# Patient Record
Sex: Female | Born: 1945 | Race: Black or African American | Hispanic: No | Marital: Married | Smoking: Never smoker
Health system: Southern US, Community
[De-identification: ages and names within clinical notes are randomized; demographics above are authoritative.]

## PROBLEM LIST (undated history)

## (undated) DIAGNOSIS — E119 Type 2 diabetes mellitus without complications: Secondary | ICD-10-CM

## (undated) DIAGNOSIS — I519 Heart disease, unspecified: Secondary | ICD-10-CM

## (undated) DIAGNOSIS — H409 Unspecified glaucoma: Secondary | ICD-10-CM

## (undated) DIAGNOSIS — K219 Gastro-esophageal reflux disease without esophagitis: Secondary | ICD-10-CM

## (undated) DIAGNOSIS — N189 Chronic kidney disease, unspecified: Secondary | ICD-10-CM

## (undated) HISTORY — DX: Heart disease, unspecified: I51.9

## (undated) HISTORY — DX: Unspecified glaucoma: H40.9

## (undated) HISTORY — PX: LUMBAR DISC SURGERY: SHX700

## (undated) HISTORY — DX: Chronic kidney disease, unspecified: N18.9

## (undated) HISTORY — PX: TRABECULECTOMY: SHX107

## (undated) HISTORY — PX: WRIST SURGERY: SHX841

## (undated) HISTORY — PX: VITRECTOMY AND CATARACT: SHX6184

## (undated) HISTORY — PX: CERVICAL FUSION: SHX112

## (undated) HISTORY — PX: ABDOMINAL HYSTERECTOMY: SHX81

## (undated) HISTORY — PX: REDUCTION MAMMAPLASTY: SUR839

## (undated) HISTORY — PX: OTHER SURGICAL HISTORY: SHX169

## (undated) HISTORY — DX: Gastro-esophageal reflux disease without esophagitis: K21.9

## (undated) HISTORY — PX: TUBAL LIGATION: SHX77

## (undated) HISTORY — PX: BREAST SURGERY: SHX581

## (undated) HISTORY — PX: KNEE CARTILAGE SURGERY: SHX688

## (undated) HISTORY — DX: Type 2 diabetes mellitus without complications: E11.9

## (undated) HISTORY — PX: EYE SURGERY: SHX253

## (undated) HISTORY — PX: SHOULDER SURGERY: SHX246

## (undated) HISTORY — PX: RETINOPATHY SURGERY: SHX765

---

## 2018-03-30 ENCOUNTER — Encounter: Payer: Self-pay | Admitting: Family Medicine

## 2018-03-30 ENCOUNTER — Ambulatory Visit (INDEPENDENT_AMBULATORY_CARE_PROVIDER_SITE_OTHER): Payer: Medicare Other | Admitting: Family Medicine

## 2018-03-30 VITALS — BP 122/66 | HR 51 | Temp 97.6°F | Ht 65.0 in | Wt 169.8 lb

## 2018-03-30 DIAGNOSIS — J309 Allergic rhinitis, unspecified: Secondary | ICD-10-CM | POA: Insufficient documentation

## 2018-03-30 DIAGNOSIS — G8929 Other chronic pain: Secondary | ICD-10-CM | POA: Insufficient documentation

## 2018-03-30 DIAGNOSIS — Z1211 Encounter for screening for malignant neoplasm of colon: Secondary | ICD-10-CM | POA: Diagnosis not present

## 2018-03-30 DIAGNOSIS — M25512 Pain in left shoulder: Secondary | ICD-10-CM

## 2018-03-30 DIAGNOSIS — E1122 Type 2 diabetes mellitus with diabetic chronic kidney disease: Secondary | ICD-10-CM | POA: Diagnosis not present

## 2018-03-30 DIAGNOSIS — I152 Hypertension secondary to endocrine disorders: Secondary | ICD-10-CM | POA: Insufficient documentation

## 2018-03-30 DIAGNOSIS — N184 Chronic kidney disease, stage 4 (severe): Secondary | ICD-10-CM | POA: Insufficient documentation

## 2018-03-30 DIAGNOSIS — H409 Unspecified glaucoma: Secondary | ICD-10-CM | POA: Diagnosis not present

## 2018-03-30 DIAGNOSIS — K219 Gastro-esophageal reflux disease without esophagitis: Secondary | ICD-10-CM

## 2018-03-30 DIAGNOSIS — I10 Essential (primary) hypertension: Secondary | ICD-10-CM

## 2018-03-30 DIAGNOSIS — E1159 Type 2 diabetes mellitus with other circulatory complications: Secondary | ICD-10-CM

## 2018-03-30 DIAGNOSIS — N183 Chronic kidney disease, stage 3 unspecified: Secondary | ICD-10-CM | POA: Insufficient documentation

## 2018-03-30 DIAGNOSIS — D649 Anemia, unspecified: Secondary | ICD-10-CM | POA: Insufficient documentation

## 2018-03-30 DIAGNOSIS — M25511 Pain in right shoulder: Secondary | ICD-10-CM

## 2018-03-30 MED ORDER — INTEGRA F 125-1 MG PO CAPS: 1.0000 | ORAL_CAPSULE | ORAL | 3 refills | Status: DC

## 2018-03-30 NOTE — Assessment & Plan Note (Signed)
At goal  Continue lisinopril 20mg daily

## 2018-03-30 NOTE — Assessment & Plan Note (Signed)
We will refill iron supplement.  Obtain records from previous PCP.  Recheck CBC at next visit with blood draw.

## 2018-03-30 NOTE — Assessment & Plan Note (Signed)
Secondary to bilateral rotator cuff tears.  Referral placed to orthopedics.

## 2018-03-30 NOTE — Progress Notes (Signed)
Subjective:  Brandi Cooper is a 73 y.o. female who presents today with a chief complaint of T2DM and to establish care.  HPI:  Her stable, chronic medical conditions are outlined below:  # T2DM - Currently on actos 45mg  daily and glipizide 20mg  bid -Last A1c was 5.7  # HTN - Currently on lisinopril 20mg  daily.  Tolerating well.  # CKD 3 - Follows with nephrology - needs a referral for [place] new [place] in [place].  - About 10 years ago "went into kidney failure" and was hospitalized.  Was told that she needed dialysis however kidneys eventually recovered. -Takes torsemide 20 mg daily as needed for leg swelling.  # Glaucoma - On acetazolamide 250mg  daily, timolol drops, xalatan drops, and dorzolamide drops  # Allergy - Take Singulair 10 mg daily at bedtime.  # GERD - On omeprazole 40mg  daily  # Anemia / Leukopenia - On iron supplementation - Integra F  # Chronic Low Back Pain secondary to degenerative disc disease -Managing without medications.  # History of bilateral rotator cuff tear - Needs referral to orthopedics in [place].  ROS: Per HPI, otherwise a complete review of systems was negative.   PMH:  The following were reviewed and entered/updated in epic: Past Medical History:  Diagnosis Date  . Chronic kidney disease   . Diabetes mellitus without complication (HCC)   . GERD (gastroesophageal reflux disease)   . Glaucoma   . Heart disease    Patient Active Problem List   Diagnosis Date Noted  . Type 2 diabetes mellitus with stage 3 chronic kidney disease, without long-term current use of insulin (HCC) 03/30/2018  . CKD (chronic kidney disease) stage 3, GFR 30-59 ml/min (HCC) 03/30/2018  . Hypertension associated with diabetes (HCC) 03/30/2018  . Glaucoma 03/30/2018  . Chronic pain of both shoulders 03/30/2018  . Allergic rhinitis 03/30/2018  . Anemia 03/30/2018   Past Surgical History:  Procedure Laterality Date  . ABDOMINAL HYSTERECTOMY     . blood transfusion    . BREAST SURGERY     Breast Reduction  . CERVICAL FUSION    . EYE SURGERY    . KNEE CARTILAGE SURGERY    . LUMBAR DISC SURGERY    . RETINOPATHY SURGERY    . SHOULDER SURGERY Bilateral    Rotator Cuff  . TRABECULECTOMY    . TRABECULECTOMY    . TUBAL LIGATION    . VITRECTOMY AND CATARACT    . WRIST SURGERY     Carpal Tunnel    Family History  Problem Relation Age of Onset  . Diabetes Mother   . Hypertension Mother   . Stroke Mother   . Diabetes Father   . Arthritis Sister   . Cancer Sister   . Heart attack Sister   . Cancer Brother   . Hypertension Daughter   . Cancer Son   . Stroke Maternal Grandmother   . Hypertension Maternal Grandmother   . Diabetes Paternal Grandmother   . Heart attack Paternal Grandfather   . Cancer Sister   . Cancer Sister   . Stroke Sister   . Hypertension Sister   . Diabetes Brother   . Kidney disease Brother     Medications- reviewed and updated Current Outpatient Medications  Medication Sig Dispense Refill  . Ascorbic Acid (VITAMIN C) 1000 MG tablet Take 1,000 mg by mouth daily.    Marland Kitchen. aspirin 81 MG tablet Take 81 mg by mouth daily.    . Biotin 5000 MCG CAPS  Take by mouth.    . Cholecalciferol (VITAMIN D3) 50 MCG (2000 UT) capsule Take by mouth.    Marland Kitchen glipiZIDE (GLUCOTROL) 10 MG tablet Take 20 mg by mouth 2 (two) times daily before a meal.    . GNP CINNAMON PO Take by mouth. Takes 2000mg  2 capfuls daily    . lisinopril (PRINIVIL,ZESTRIL) 20 MG tablet Take 20 mg by mouth daily.    . montelukast (SINGULAIR) 10 MG tablet Take 10 mg by mouth at bedtime.    . Omega-3 Fatty Acids (FISH OIL) 1200 MG CAPS Take 2 capsules by mouth.    Marland Kitchen omeprazole (PRILOSEC) 40 MG capsule Take 40 mg by mouth daily.    . pioglitazone (ACTOS) 45 MG tablet Take 45 mg by mouth daily.    . timolol (BETIMOL) 0.5 % ophthalmic solution Place 1 drop into both eyes 2 (two) times daily.    Marland Kitchen torsemide (DEMADEX) 20 MG tablet Take 20 mg by mouth  daily.    Marland Kitchen acetaZOLAMIDE (DIAMOX) 250 MG tablet     . dorzolamide (TRUSOPT) 2 % ophthalmic solution Place into both eyes 3 (three) times daily.     . Fe Fum-FePoly-FA-Vit C-Vit B3 (INTEGRA F) 125-1 MG CAPS Take 1 capsule by mouth every other day. 45 capsule 3  . latanoprost (XALATAN) 0.005 % ophthalmic solution      No current facility-administered medications for this visit.     Allergies-reviewed and updated Allergies  Allergen Reactions  . Amitriptyline     Social History   Socioeconomic History  . Marital status: Married    Spouse name: Not on file  . Number of children: Not on file  . Years of education: Not on file  . Highest education level: Not on file  Occupational History  . Not on file  Social Needs  . Financial resource strain: Not on file  . Food insecurity:    Worry: Not on file    Inability: Not on file  . Transportation needs:    Medical: Not on file    Non-medical: Not on file  Tobacco Use  . Smoking status: Never Smoker  . Smokeless tobacco: Never Used  Substance and Sexual Activity  . Alcohol use: Never    Frequency: Never  . Drug use: Never  . Sexual activity: Not on file  Lifestyle  . Physical activity:    Days per week: Not on file    Minutes per session: Not on file  . Stress: Not on file  Relationships  . Social connections:    Talks on phone: Not on file    Gets together: Not on file    Attends religious service: Not on file    Active member of club or organization: Not on file    Attends meetings of clubs or organizations: Not on file    Relationship status: Not on file  Other Topics Concern  . Not on file  Social History Narrative  . Not on file     Objective:  Physical Exam: BP 122/66 (BP Location: Left Arm, Patient Position: Sitting, Cuff Size: Normal)   Pulse (!) 51   Temp 97.6 F (36.4 C) (Oral)   Ht 5\' 5"  (1.651 m)   Wt 169 lb 12 oz (77 kg)   SpO2 99%   BMI 28.25 kg/m   Gen: NAD, resting comfortably CV: RRR with  no murmurs appreciated Pulm: NWOB, CTAB with no crackles, wheezes, or rhonchi GI: Normal bowel sounds present. Soft, Nontender, Nondistended. MSK:  No edema, cyanosis, or clubbing noted Skin: Warm, dry Neuro: Grossly normal, moves all extremities Psych: Normal affect and thought content  Assessment/Plan:  Type 2 diabetes mellitus with stage 3 chronic kidney disease, without long-term current use of insulin (HCC) Continue Actos 45 mg daily and glipizide 20 mg twice daily.  Obtain records from previous PCP.  Follow-up with me in a few months for repeat A1c testing.  Hypertension associated with diabetes (HCC) At goal.  Continue lisinopril 20 mg daily.  Glaucoma Continue state is alive and drops.  Will place referral to retina specialist.  CKD (chronic kidney disease) stage 3, GFR 30-59 ml/min (HCC) Obtain records from previous PCP.  Will place referral to nephrology.  Chronic pain of both shoulders Secondary to bilateral rotator cuff tears.  Referral placed to orthopedics.  Anemia We will refill iron supplement.  Obtain records from previous PCP.  Recheck CBC at next visit with blood draw.  Allergic rhinitis Continue Singulair 10 mg daily.  Preventative Healthcare Patient was instructed to return soon for CPE.  Obtain records from previous PCP. Health Maintenance Due  Topic Date Due  . HEMOGLOBIN A1C  [birth year]  . Hepatitis C Screening  1945-10-19  . FOOT EXAM  08/30/1956  . OPHTHALMOLOGY EXAM  08/30/1956  . TETANUS/TDAP  08/30/1965  . MAMMOGRAM  08/30/1996  . COLONOSCOPY  08/30/1996  . DEXA SCAN  08/31/2011  . PNA vac Low Risk Adult (1 of 2 - PCV13) 08/31/2011   Time Spent: I spent >60 minutes face-to-face with the patient, with more than half spent on coordinating care and counseling for management plan for type 2 diabetes, hypertension, glaucoma, CKD stage III, chronic shoulder pain, anemia, allergic rhinitis.  Katina Degree. [name], MD 03/30/2018 5:17 PM

## 2018-03-30 NOTE — Assessment & Plan Note (Signed)
Obtain records from previous PCP.  Will place referral to nephrology.

## 2018-03-30 NOTE — Patient Instructions (Addendum)
It was very nice to see you today!  I will refer you to an ophthalmologist, nephrologist, orthopedist, and gastroenterologist.  I will send in more Integra F.  Please come back to see me in a few months for your physical with blood work.  Come back to see me sooner as needed.  Take care, Dr [name]

## 2018-03-30 NOTE — Assessment & Plan Note (Signed)
Continue Actos 45 mg daily and glipizide 20 mg twice daily.  Obtain records from previous PCP.  Follow-up with me in a few months for repeat A1c testing.

## 2018-03-30 NOTE — Assessment & Plan Note (Signed)
Continue state is alive and drops.  Will place referral to retina specialist.

## 2018-03-30 NOTE — Assessment & Plan Note (Signed)
Continue Singulair 10 mg daily.

## 2018-05-10 ENCOUNTER — Encounter: Payer: Self-pay | Admitting: Gastroenterology

## 2018-05-31 ENCOUNTER — Telehealth: Payer: Self-pay

## 2018-05-31 NOTE — Telephone Encounter (Signed)
Covid-19 travel screening questions  Have you traveled in the last 14 days? No If yes where?  Do you now or have you had a fever in the last 14 days? No  Do you have any respiratory symptoms of shortness of breath or cough now or in the last 14 days? No  Do you have a medical history of Congestive Heart Failure? N/A  Do you have a medical history of lung disease? N/A  Do you have any family members or close contacts with diagnosed or suspected Covid-19? No  Pt plans to keep her appt as scheduled.        

## 2018-06-01 ENCOUNTER — Encounter: Payer: Self-pay | Admitting: Gastroenterology

## 2018-06-01 ENCOUNTER — Ambulatory Visit (INDEPENDENT_AMBULATORY_CARE_PROVIDER_SITE_OTHER): Payer: Medicare Other | Admitting: Gastroenterology

## 2018-06-01 ENCOUNTER — Other Ambulatory Visit: Payer: Self-pay

## 2018-06-01 ENCOUNTER — Encounter (INDEPENDENT_AMBULATORY_CARE_PROVIDER_SITE_OTHER): Payer: Self-pay

## 2018-06-01 VITALS — BP 102/58 | HR 60 | Temp 98.2°F | Ht 65.0 in | Wt 176.0 lb

## 2018-06-01 DIAGNOSIS — R1013 Epigastric pain: Secondary | ICD-10-CM

## 2018-06-01 DIAGNOSIS — Z800 Family history of malignant neoplasm of digestive organs: Secondary | ICD-10-CM | POA: Diagnosis not present

## 2018-06-01 DIAGNOSIS — D649 Anemia, unspecified: Secondary | ICD-10-CM

## 2018-06-01 NOTE — Patient Instructions (Signed)
If you are age 72 or older, your body mass index should be between 23-30. Your Body mass index is 29.29 kg/m. If this is out of the aforementioned range listed, please consider follow up with your Primary Care Provider.  If you are age 42 or younger, your body mass index should be between 19-25. Your Body mass index is 29.29 kg/m. If this is out of the aformentioned range listed, please consider follow up with your Primary Care Provider.   You will be due for a recall colonoscopy in 07-2018. We will send you a reminder in the mail when it gets closer to that time.  To help prevent the possible spread of infection to our patients, communities, and staff; we will be implementing the following measures:  Please only allow one visitor/family member to accompany you to any upcoming appointments with [facility]. If you have any concerns about this please contact our office to discuss prior to the appointment.   Take omeprazole 1 every other day for a week. Then stop.  It was a pleasure to see you today!  Dr. Myrtie Neither

## 2018-06-01 NOTE — Progress Notes (Addendum)
[facility] Consult Note:  History: Brandi Cooper 06/01/2018  Referring physician: Ardith Dark, MD  Reason for consult/chief complaint: Colon Cancer Screening (son passed with colon cancer in 07/15/2012. used to have colonoscopy/endoscopy ever 3 years.  )   Subjective  HPI: Recently became a new patient of Brandi Cooper with 1 PCP office note available for review.Reportedly good diabetic control. Reports colonoscopy in [facility] 07-15-801-May-2011, Jul 15, 2012  Brandi Cooper is a pleasant 73 year old woman, and a tangential historian.  There are no records available from her GI practice in [place], and she says she was unable to get any from them, and had difficulty "getting answers" on her problems and reasons for medicines.  She was told at one point she had "ulcers".  She figures this is the reason she is on omeprazole but is uncertain.  She denies heartburn, dysphagia, nausea, vomiting or early satiety.  She was having dyspepsia some years in the past, and thinks maybe that was why the omeprazole was started.  She denies chronic abdominal pain, bloating diarrhea or rectal bleeding.  She was on iron supplements, but says they always cause bloating and diarrhea so she stopped.  Her son died in 15-Jul-2012, the circumstances are unknown.  She says that she took him to the hospital many times and could never figure out what was wrong.  It culminated in hospitalization when he was severely ill died in the hospital shortly thereafter.  It sounds like she may have incomplete recollection of those events, but tells me that 1 of her daughters told her later that colon cancer was the cause of death.  Brandi Cooper has not yet had scheduled follow-up with primary care nor had any labs done.  ROS:  Review of Systems  Constitutional: Negative for appetite change and unexpected weight change.  HENT: Negative for mouth sores and voice change.   Eyes: Negative for pain and redness.  Respiratory: Negative for cough and  shortness of breath.   Cardiovascular: Negative for chest pain and palpitations.  Genitourinary: Negative for dysuria and hematuria.  Musculoskeletal: Positive for arthralgias. Negative for myalgias.  Skin: Negative for pallor and rash.  Neurological: Negative for weakness and headaches.  Hematological: Negative for adenopathy.     Past Medical History: Past Medical History:  Diagnosis Date  . Chronic kidney disease   . Diabetes mellitus without complication (HCC)   . GERD (gastroesophageal reflux disease)   . Glaucoma   . Heart disease      Past Surgical History: Past Surgical History:  Procedure Laterality Date  . ABDOMINAL HYSTERECTOMY    . blood transfusion    . BREAST SURGERY     Breast Reduction  . CERVICAL FUSION    . EYE SURGERY    . KNEE CARTILAGE SURGERY    . LUMBAR DISC SURGERY    . RETINOPATHY SURGERY    . SHOULDER SURGERY Bilateral    Rotator Cuff  . TRABECULECTOMY    . TUBAL LIGATION    . VITRECTOMY AND CATARACT    . WRIST SURGERY     Carpal Tunnel     Family History: Family History  Problem Relation Age of Onset  . Diabetes Mother   . Hypertension Mother   . Stroke Mother   . Diabetes Father   . Arthritis Sister   . Cancer Sister   . Heart attack Sister   . Cancer Brother   . Hypertension Daughter   . Cancer Son   . Stroke Maternal Grandmother   .  Hypertension Maternal Grandmother   . Diabetes Paternal Grandmother   . Heart attack Paternal Grandfather   . Cancer Sister   . Cancer Sister   . Stroke Sister   . Hypertension Sister   . Diabetes Brother   . Kidney disease Brother     Social History: Social History   Socioeconomic History  . Marital status: Married    Spouse name: Not on file  . Number of children: Not on file  . Years of education: Not on file  . Highest education level: Not on file  Occupational History  . Not on file  Social Needs  . Financial resource strain: Not on file  . Food insecurity:    Worry: Not  on file    Inability: Not on file  . Transportation needs:    Medical: Not on file    Non-medical: Not on file  Tobacco Use  . Smoking status: Never Smoker  . Smokeless tobacco: Never Used  Substance and Sexual Activity  . Alcohol use: Never    Frequency: Never  . Drug use: Never  . Sexual activity: Not on file  Lifestyle  . Physical activity:    Days per week: Not on file    Minutes per session: Not on file  . Stress: Not on file  Relationships  . Social connections:    Talks on phone: Not on file    Gets together: Not on file    Attends religious service: Not on file    Active member of club or organization: Not on file    Attends meetings of clubs or organizations: Not on file    Relationship status: Not on file  Other Topics Concern  . Not on file  Social History Narrative  . Not on file    Allergies: Allergies  Allergen Reactions  . Amitriptyline   . Nsaids     Was told to avoid all NSAIDs due to kidney function    Outpatient Meds: Current Outpatient Medications  Medication Sig Dispense Refill  . acetaZOLAMIDE (DIAMOX) 250 MG tablet     . Ascorbic Acid (VITAMIN C) 1000 MG tablet Take 1,000 mg by mouth daily.    Marland Kitchen aspirin 81 MG tablet Take 81 mg by mouth daily.    . Biotin 5000 MCG CAPS Take by mouth.    . Cholecalciferol (VITAMIN D3) 50 MCG (2000 UT) capsule Take by mouth.    . dorzolamide (TRUSOPT) 2 % ophthalmic solution Place into both eyes 3 (three) times daily.     Marland Kitchen glipiZIDE (GLUCOTROL) 10 MG tablet Take 20 mg by mouth 2 (two) times daily before a meal.    . GNP CINNAMON PO Take by mouth. Takes  2 capfuls daily    . latanoprost (XALATAN) 0.005 % ophthalmic solution     . lisinopril (PRINIVIL,ZESTRIL) 20 MG tablet Take 20 mg by mouth daily.    . Omega-3 Fatty Acids (FISH OIL) 1200 MG CAPS Take 2 capsules by mouth.    Marland Kitchen omeprazole (PRILOSEC) 40 MG capsule Take 40 mg by mouth daily.    . pioglitazone (ACTOS) 45 MG tablet Take 45 mg by mouth daily.     . timolol (BETIMOL) 0.5 % ophthalmic solution Place 1 drop into both eyes 2 (two) times daily.    Marland Kitchen torsemide (DEMADEX) 20 MG tablet Take 20 mg by mouth daily.     No current facility-administered medications for this visit.       ___________________________________________________________________ Objective   Exam:  BP (!) 102/58   Pulse 60   Temp 98.2 F (36.8 C)   Ht 5\' 5"  (1.651 m)   Wt 176 lb (79.8 kg)   BMI 29.29 kg/m    General: Well-appearing, pleasant and conversational  Eyes: sclera anicteric, no redness  ENT: oral mucosa moist without lesions, no cervical or supraclavicular lymphadenopathy  CV: RRR without murmur, S1/S2, no JVD, no peripheral edema  Resp: clear to auscultation bilaterally, normal RR and effort noted  GI: soft, no tenderness, with active bowel sounds. No guarding or palpable organomegaly noted.  Skin; warm and dry, no rash or jaundice noted  Neuro: awake, alert and oriented x 3. Normal gross motor function and fluent speech  Labs:  No prior records available for review.  No labs or other testing were done after the January 16 initial PCP visit.  Assessment: Encounter Diagnoses  Name Primary?  . Dyspepsia Yes  . Family history of colon cancer   . Anemia, unspecified type     Previous history of dyspepsia, feeling well at present.  Unclear initial indication for omeprazole.  Although I have no prior records to know the exact indication, she seems well at present, and I think it would be reasonable to try and wean off it and reassess symptoms.  She would like to be off any unnecessary meds if possible.  Reported history of anemia, no details.  I wonder if it is related to the reported chronic kidney disease.  Reported history of colon polyps, though no details available.  Reported history of colon cancer in her son.  Patient's last colonoscopy was 2014 according to her best recollection.  She has a list of all her physicians and dates  of procedures.  Plan:  Decrease omeprazole to every other day for the next week and then stop.  If she has frequent heartburn or recurrence of dyspepsia, the medication can certainly be resumed and I asked her to contact me if that occurs.  If we take at face value the report from a family member that Brandi Cooper's had colon cancer, then a colonoscopy after a 5-year interval would be reasonable.  We put her on a recall about 2 months from now because we are currently not scheduling routine endoscopic procedures due to COVID-19.  We will also make a request for records from her previous GI physician.  Thank you for the courtesy of this consult.  Please call me with any questions or concerns.  Charlie Pitter III  CC: Referring provider noted above   _____________________________________________  Addendum for record review 06/15/18:  From [facility] gastroenterology in [place], office note dated October 2017 following up EGD colonoscopy.  Patient complaining loose stool and burning in abdomen.  Noted to have been FOBT +June 2017 hemoglobin 9.9 threatening 1.47 EGD and colonoscopy September 2017 reportedly irregular Z line, hiatal hernia, multiple antral ulcers, diverticulosis, "medium hemorrhoids".  For unclear reasons, they then recommended colonoscopy at a 5-year interval. Gastric biopsy done during EGD on 12/05/2015 was negative for H. Pylori.  Records from a different GI practice in [place] dated July 2014  Indicates multiple endoscopic procedures going back to 2008.  In 2008 reported gastric ulcer, 2009 gastric ulcer had healed.  2008 colonoscopy rectal tubular adenoma, 2011 colonoscopy with hemorrhoids, otherwise unremarkable.  Pathology from January 2011 colonoscopy was normal terminal ileal biopsies were no microscopic colitis.  She was noted to have over 20 mast cells per high far field with a positive tryptase immunoperoxidase stain, consistent  with mastosytic colitis.  There is no  indication in office notes of what the clinical significance this was believed to be, nor what therapies may have been undertaken.  Biopsy from 2009 upper endoscopy negative for H. Pylori.  Based on that, the patient is not currently due for a colonoscopy, even if we believe her son to truly have had colorectal cancer.  She would not be due until September 2022.  Therefore, I will have our office adjust her recall to that period of time and let her know.   Amada Jupiter, MD    [facility]

## 2018-06-30 ENCOUNTER — Ambulatory Visit: Payer: Medicare Other | Admitting: Family Medicine

## 2018-08-21 ENCOUNTER — Telehealth: Payer: Self-pay | Admitting: Family Medicine

## 2018-08-21 ENCOUNTER — Other Ambulatory Visit: Payer: Self-pay

## 2018-08-21 NOTE — Telephone Encounter (Signed)
Copied from [place] [id number]. Topic: Quick Communication - Rx Refill/Question >> Aug 21, 2018 10:48 AM Blase Mess A wrote: Medication: lisinopril (PRINIVIL,ZESTRIL) 20 MG tablet [[id number]] , glipiZIDE (GLUCOTROL) 10 MG tablet [[id number]],   Has the patient contacted their pharmacy? Yes  (Agent: If no, request that the patient contact the pharmacy for the refill.) (Agent: If yes, when and what did the pharmacy advise?)  Preferred Pharmacy (with phone number or street name): [facility] [place], [address] AT [address] [phone] (Phone) [phone] (Fax)    Agent: Please be advised that RX refills may take up to 3 business days. We ask that you follow-up with your pharmacy.

## 2018-08-21 NOTE — Telephone Encounter (Signed)
See request °

## 2018-08-23 ENCOUNTER — Other Ambulatory Visit: Payer: Self-pay

## 2018-08-23 ENCOUNTER — Telehealth: Payer: Self-pay | Admitting: Family Medicine

## 2018-08-23 MED ORDER — GLIPIZIDE 10 MG PO TABS: 20.0000 mg | ORAL_TABLET | Freq: Two times a day (BID) | ORAL | 0 refills | Status: DC

## 2018-08-23 MED ORDER — PIOGLITAZONE HCL 45 MG PO TABS: 45.0000 mg | ORAL_TABLET | Freq: Every day | ORAL | 1 refills | Status: DC

## 2018-08-23 MED ORDER — LISINOPRIL 20 MG PO TABS: 20.0000 mg | ORAL_TABLET | Freq: Every day | ORAL | 0 refills | Status: DC

## 2018-08-23 MED ORDER — TORSEMIDE 20 MG PO TABS: 20.0000 mg | ORAL_TABLET | Freq: Every day | ORAL | 1 refills | Status: DC

## 2018-08-23 NOTE — Telephone Encounter (Signed)
Rx sent 

## 2018-08-23 NOTE — Telephone Encounter (Signed)
Copied from [place] [id number]. Topic: Quick Communication - Rx Refill/Question >> Aug 23, 2018  3:22 PM Alanda Slim E wrote: Medication: torsemide (DEMADEX) 20 MG tablet pioglitazone (ACTOS) 45 MG tablet  Has the patient contacted their pharmacy?  Call pcp for new Rx  Preferred Pharmacy (with phone number or street name): [facility] [place], [address] AT [address] [place] [phone] (Phone) [phone] (Fax)    Agent: Please be advised that RX refills may take up to 3 business days. We ask that you follow-up with your pharmacy.

## 2018-08-23 NOTE — Telephone Encounter (Signed)
Rx sent to pharmacy   

## 2018-09-26 ENCOUNTER — Encounter: Payer: Self-pay | Admitting: Family Medicine

## 2018-09-26 ENCOUNTER — Other Ambulatory Visit: Payer: Self-pay

## 2018-09-26 ENCOUNTER — Ambulatory Visit (INDEPENDENT_AMBULATORY_CARE_PROVIDER_SITE_OTHER): Payer: Medicare Other | Admitting: Family Medicine

## 2018-09-26 VITALS — BP 114/64 | HR 62 | Temp 97.4°F | Ht 65.0 in | Wt 178.0 lb

## 2018-09-26 DIAGNOSIS — G44009 Cluster headache syndrome, unspecified, not intractable: Secondary | ICD-10-CM

## 2018-09-26 DIAGNOSIS — Z13220 Encounter for screening for lipoid disorders: Secondary | ICD-10-CM | POA: Diagnosis not present

## 2018-09-26 DIAGNOSIS — N183 Chronic kidney disease, stage 3 unspecified: Secondary | ICD-10-CM

## 2018-09-26 DIAGNOSIS — Z1159 Encounter for screening for other viral diseases: Secondary | ICD-10-CM

## 2018-09-26 DIAGNOSIS — J309 Allergic rhinitis, unspecified: Secondary | ICD-10-CM

## 2018-09-26 DIAGNOSIS — E1122 Type 2 diabetes mellitus with diabetic chronic kidney disease: Secondary | ICD-10-CM | POA: Diagnosis not present

## 2018-09-26 DIAGNOSIS — Z0001 Encounter for general adult medical examination with abnormal findings: Secondary | ICD-10-CM | POA: Diagnosis not present

## 2018-09-26 DIAGNOSIS — E1159 Type 2 diabetes mellitus with other circulatory complications: Secondary | ICD-10-CM

## 2018-09-26 DIAGNOSIS — M25512 Pain in left shoulder: Secondary | ICD-10-CM

## 2018-09-26 DIAGNOSIS — G8929 Other chronic pain: Secondary | ICD-10-CM

## 2018-09-26 DIAGNOSIS — R682 Dry mouth, unspecified: Secondary | ICD-10-CM

## 2018-09-26 DIAGNOSIS — I10 Essential (primary) hypertension: Secondary | ICD-10-CM

## 2018-09-26 DIAGNOSIS — R42 Dizziness and giddiness: Secondary | ICD-10-CM | POA: Diagnosis not present

## 2018-09-26 DIAGNOSIS — D649 Anemia, unspecified: Secondary | ICD-10-CM

## 2018-09-26 DIAGNOSIS — I152 Hypertension secondary to endocrine disorders: Secondary | ICD-10-CM

## 2018-09-26 DIAGNOSIS — H409 Unspecified glaucoma: Secondary | ICD-10-CM

## 2018-09-26 DIAGNOSIS — M25511 Pain in right shoulder: Secondary | ICD-10-CM

## 2018-09-26 LAB — COMPREHENSIVE METABOLIC PANEL
ALT: 11 U/L (ref 0–35)
AST: 14 U/L (ref 0–37)
Albumin: 4.1 g/dL (ref 3.5–5.2)
Alkaline Phosphatase: 75 U/L (ref 39–117)
BUN: 45 mg/dL — ABNORMAL HIGH (ref 6–23)
CO2: 19 mEq/L (ref 19–32)
Calcium: 8.8 mg/dL (ref 8.4–10.5)
Chloride: 113 mEq/L — ABNORMAL HIGH (ref 96–112)
Creatinine, Ser: 1.97 mg/dL — ABNORMAL HIGH (ref 0.40–1.20)
GFR: 30.15 mL/min — ABNORMAL LOW (ref >60.00)
Glucose, Bld: 100 mg/dL — ABNORMAL HIGH (ref 70–99)
Potassium: 4.2 mEq/L (ref 3.5–5.1)
Sodium: 141 mEq/L (ref 135–145)
Total Bilirubin: 0.4 mg/dL (ref 0.2–1.2)
Total Protein: 7 g/dL (ref 6.0–8.3)

## 2018-09-26 LAB — LIPID PANEL
Cholesterol: 172 mg/dL (ref 0–200)
HDL: 55 mg/dL (ref >39.00)
LDL Cholesterol: 97 mg/dL (ref 0–99)
NonHDL: 116.6
Total CHOL/HDL Ratio: 3
Triglycerides: 97 mg/dL (ref 0.0–149.0)
VLDL: 19.4 mg/dL (ref 0.0–40.0)

## 2018-09-26 LAB — CBC
HCT: 32.8 % — ABNORMAL LOW (ref 36.0–46.0)
Hemoglobin: 10.6 g/dL — ABNORMAL LOW (ref 12.0–15.0)
MCHC: 32.3 g/dL (ref 30.0–36.0)
MCV: 92.1 fl (ref 78.0–100.0)
Platelets: 186 10*3/uL (ref 150.0–400.0)
RBC: 3.56 Mil/uL — ABNORMAL LOW (ref 3.87–5.11)
RDW: 15.1 % (ref 11.5–15.5)
WBC: 5.7 10*3/uL (ref 4.0–10.5)

## 2018-09-26 LAB — VITAMIN D 25 HYDROXY (VIT D DEFICIENCY, FRACTURES): VITD: 50.99 ng/mL (ref 30.00–100.00)

## 2018-09-26 LAB — HEMOGLOBIN A1C: Hgb A1c MFr Bld: 6.3 % (ref 4.6–6.5)

## 2018-09-26 LAB — VITAMIN B12: Vitamin B-12: 1010 pg/mL — ABNORMAL HIGH (ref 211–911)

## 2018-09-26 LAB — TSH: TSH: 1.4 u[IU]/mL (ref 0.35–4.50)

## 2018-09-26 NOTE — Assessment & Plan Note (Signed)
Likely secondary to medications.  Encouraged good oral hydration.  Also recommended Biotene mouthwash.  Recommend follow-up with dentistry soon.

## 2018-09-26 NOTE — Assessment & Plan Note (Signed)
Check CBC 

## 2018-09-26 NOTE — Assessment & Plan Note (Signed)
No red flags.  Offered prescription for triptan however patient declined.

## 2018-09-26 NOTE — Assessment & Plan Note (Signed)
At goal  Continue lisinopril 20mg daily

## 2018-09-26 NOTE — Patient Instructions (Signed)
It was very nice to see you today!  Please let me know if the vertigo or headaches come back.  Please schedule your mammogram and bone density scan soon.  We will check blood work today.  Come back in 6 months, or sooner if needed.   Take care, Dr Jerline Pain  Please try these tips to maintain a healthy lifestyle:   Eat at least 3 REAL meals and 1-2 snacks per day.  Aim for no more than 5 hours between eating.  If you eat breakfast, please do so within one hour of getting up.    Obtain twice as many fruits/vegetables as protein or carbohydrate foods for both lunch and dinner. (Half of each meal should be fruits/vegetables, one quarter protein, and one quarter starchy carbs)   Cut down on sweet beverages. This includes juice, soda, and sweet tea.    Exercise at least 150 minutes every week.    Preventive Care 45 Years and Older, Female Preventive care refers to lifestyle choices and visits with your health care provider that can promote health and wellness. This includes:  A yearly physical exam. This is also called an annual well check.  Regular dental and eye exams.  Immunizations.  Screening for certain conditions.  Healthy lifestyle choices, such as diet and exercise. What can I expect for my preventive care visit? Physical exam Your health care provider will check:  Height and weight. These may be used to calculate body mass index (BMI), which is a measurement that tells if you are at a healthy weight.  Heart rate and blood pressure.  Your skin for abnormal spots. Counseling Your health care provider may ask you questions about:  Alcohol, tobacco, and drug use.  Emotional well-being.  Home and relationship well-being.  Sexual activity.  Eating habits.  History of falls.  Memory and ability to understand (cognition).  Work and work Statistician.  Pregnancy and menstrual history. What immunizations do I need?  Influenza (flu) vaccine  This is  recommended every year. Tetanus, diphtheria, and pertussis (Tdap) vaccine  You may need a Td booster every 10 years. Varicella (chickenpox) vaccine  You may need this vaccine if you have not already been vaccinated. Zoster (shingles) vaccine  You may need this after age 36. Pneumococcal conjugate (PCV13) vaccine  One dose is recommended after age 63. Pneumococcal polysaccharide (PPSV23) vaccine  One dose is recommended after age 52. Measles, mumps, and rubella (MMR) vaccine  You may need at least one dose of MMR if you were born in 1957 or later. You may also need a second dose. Meningococcal conjugate (MenACWY) vaccine  You may need this if you have certain conditions. Hepatitis A vaccine  You may need this if you have certain conditions or if you travel or work in places where you may be exposed to hepatitis A. Hepatitis B vaccine  You may need this if you have certain conditions or if you travel or work in places where you may be exposed to hepatitis B. Haemophilus influenzae type b (Hib) vaccine  You may need this if you have certain conditions. You may receive vaccines as individual doses or as more than one vaccine together in one shot (combination vaccines). Talk with your health care provider about the risks and benefits of combination vaccines. What tests do I need? Blood tests  Lipid and cholesterol levels. These may be checked every 5 years, or more frequently depending on your overall health.  Hepatitis C test.  Hepatitis B test.  Screening  Lung cancer screening. You may have this screening every year starting at age 76 if you have a 30-pack-year history of smoking and currently smoke or have quit within the past 15 years.  Colorectal cancer screening. All adults should have this screening starting at age 47 and continuing until age 39. Your health care provider may recommend screening at age 51 if you are at increased risk. You will have tests every 1-10  years, depending on your results and the type of screening test.  Diabetes screening. This is done by checking your blood sugar (glucose) after you have not eaten for a while (fasting). You may have this done every 1-3 years.  Mammogram. This may be done every 1-2 years. Talk with your health care provider about how often you should have regular mammograms.  BRCA-related cancer screening. This may be done if you have a family history of breast, ovarian, tubal, or peritoneal cancers. Other tests  Sexually transmitted disease (STD) testing.  Bone density scan. This is done to screen for osteoporosis. You may have this done starting at age 76. Follow these instructions at home: Eating and drinking  Eat a diet that includes fresh fruits and vegetables, whole grains, lean protein, and low-fat dairy products. Limit your intake of foods with high amounts of sugar, saturated fats, and salt.  Take vitamin and mineral supplements as recommended by your health care provider.  Do not drink alcohol if your health care provider tells you not to drink.  If you drink alcohol: ? Limit how much you have to 0-1 drink a day. ? Be aware of how much alcohol is in your drink. In the [place] one drink equals one 12 oz bottle of beer (355 mL), one 5 oz glass of wine (148 mL), or one 1 oz glass of hard liquor (44 mL). Lifestyle  Take daily care of your teeth and gums.  Stay active. Exercise for at least 30 minutes on 5 or more days each week.  Do not use any products that contain nicotine or tobacco, such as cigarettes, e-cigarettes, and chewing tobacco. If you need help quitting, ask your health care provider.  If you are sexually active, practice safe sex. Use a condom or other form of protection in order to prevent STIs (sexually transmitted infections).  Talk with your health care provider about taking a low-dose aspirin or statin. What's next?  Go to your health care provider once a year for a well  check visit.  Ask your health care provider how often you should have your eyes and teeth checked.  Stay up to date on all vaccines. This information is not intended to replace advice given to you by your health care provider. Make sure you discuss any questions you have with your health care provider. Document Released: 03/28/2015 Document Revised: 02/23/2018 Document Reviewed: 02/23/2018 Elsevier Patient Education  2020 [employer].

## 2018-09-26 NOTE — Assessment & Plan Note (Signed)
Stable.  Continue Singulair daily.

## 2018-09-26 NOTE — Assessment & Plan Note (Signed)
Check C met. 

## 2018-09-26 NOTE — Assessment & Plan Note (Signed)
Stable.  Has ophthalmology appointment tomorrow.

## 2018-09-26 NOTE — Assessment & Plan Note (Signed)
No red flags.  Discussed likely BPPV.  Would not pursue further management at this time given symptoms have resolved.  Discussed reasons to return to care.  May need referral to vestibular rehab if symptoms recur.

## 2018-09-26 NOTE — Assessment & Plan Note (Signed)
Check A1c.  Continue Actos 45 mg daily and glipizide 20 mg twice daily.

## 2018-09-26 NOTE — Progress Notes (Addendum)
Chief Complaint:  Brandi Cooper is a 73 y.o. female who presents today for her annual comprehensive physical exam and subsequent medicare annual wellness visit.    Assessment/Plan:  Vertigo No red flags.  Discussed likely BPPV.  Would not pursue further management at this time given symptoms have resolved.  Discussed reasons to return to care.  May need referral to vestibular rehab if symptoms recur.  Cluster headaches No red flags.  Offered prescription for triptan however patient declined.  Dry mouth Likely secondary to medications.  Encouraged good oral hydration.  Also recommended Biotene mouthwash.  Recommend follow-up with dentistry soon.  Allergic rhinitis Stable.  Continue Singulair daily.  Glaucoma Stable.  Has ophthalmology appointment tomorrow.  Hypertension associated with diabetes ([place]) At goal.  Continue lisinopril 20 mg daily.  CKD (chronic kidney disease) stage 3, GFR 30-59 ml/min (HCC) Check C met.  Type 2 diabetes mellitus with stage 3 chronic kidney disease, without long-term current use of insulin (HCC) Check A1c.  Continue Actos 45 mg daily and glipizide 20 mg twice daily.  Anemia Check CBC.   Body mass index is 29.62 kg/m. / Overweight BMI Metric Follow Up - 09/26/18 1232      BMI Metric Follow Up-Please document annually   BMI Metric Follow Up  Education provided        Preventative Healthcare: Check CBC, C met, lipid panel, TSH, and A1c.  Check hepatitis C antibody. Due for mammogram and DEX scan - these will be set up today. Patient does not want any vaccines.   Patient Counseling(The following topics were reviewed and/or handout was given):  -Nutrition: Stressed importance of moderation in sodium/caffeine intake, saturated fat and cholesterol, caloric balance, sufficient intake of fresh fruits, vegetables, and fiber.  -Stressed the importance of regular exercise.   -Substance Abuse: Discussed cessation/primary prevention of tobacco,  alcohol, or other drug use; driving or other dangerous activities under the influence; availability of treatment for abuse.   -Injury prevention: Discussed safety belts, safety helmets, smoke detector, smoking near bedding or upholstery.   -Sexuality: Discussed sexually transmitted diseases, partner selection, use of condoms, avoidance of unintended pregnancy and contraceptive alternatives.   -Dental health: Discussed importance of regular tooth brushing, flossing, and dental visits.  -Health maintenance and immunizations reviewed. Please refer to Health maintenance section.  During the course of the visit the patient was educated and counseled about appropriate screening and preventive services including:        Fall prevention   Nutrition Physical Activity Weight Management Cognition  Return to care in 1 year for next preventative visit.     Subjective:  HPI:  Health Risk Assessment: Patient considers her overall health to be good. He has no difficulty performing the following: . Preparing food and eating . Bathing  . Getting dressed . Using the toilet . Shopping . Managing Finances . Moving around from place to place  She has not had any falls within the past year.   Depression screen PHQ 2/9 09/26/2018  Decreased Interest 0  Down, Depressed, Hopeless 0  PHQ - 2 Score 0   Lifestyle Factors: Diet: No specific diets. Exercise: No specific exercises.  Patient Care Team: Vivi Barrack, MD as PCP - General (Family Medicine)   She is concerned about having a few of her teeth breakdown.  This is happened over the last few months.  She has not yet established with a dentist.  Occasionally has gum irritation due to this.  She also had a  couple episodes of room spinning sensation several weeks ago.  This lasted for several days then spontaneously subsided.  Did not have any sort of weakness or numbness associate with this episode.  No obvious precipitating events.  Symptoms  were much worse with any sort of head movement such as turning from side to side or from going from sitting to standing or from lying to sitting.  She has not had any recurrence of symptoms for the past several days.  She is also had a recurrence of cluster headaches.  She has been diagnosed with this in the past.  Was previously on a triptan medication which did not work effectively.  No current headaches.  She has usually been managing without medications.  Her chronic medical conditions are outlined below:  # T2DM - Currently on actos '45mg'$  daily and glipizide '20mg'$  bid and tolerating well - ROS: No reported polyuria or polydipsia  # HTN - Currently on lisinopril '20mg'$  daily.  Tolerating well. - ROS: No reported chest pain or shortness of breath  # CKD 3 - Referred to nephrology - Uses torsemide as needed for leg swelling  # Glaucoma - Referred to ophtho - On acetazolamide '250mg'$  daily, timolol drops, xalatan drops, and dorzolamide drops  # Allergy - Takes Singulair 10 mg daily at bedtime.  # GERD - On omeprazole '40mg'$  daily and tolerating well  # Anemia / Leukopenia - On iron supplementation - Integra F  # Chronic Low Back Pain secondary to degenerative disc disease -Managing without medications.  # History of bilateral rotator cuff tear - Referred to orthopedics  ROS: Per HPI, otherwise a complete review of systems was negative.   PMH:  The following were reviewed and entered/updated in epic: Past Medical History:  Diagnosis Date  . Chronic kidney disease   . Diabetes mellitus without complication ([place])   . GERD (gastroesophageal reflux disease)   . Glaucoma   . Heart disease    Patient Active Problem List   Diagnosis Date Noted  . Vertigo 09/26/2018  . Cluster headaches 09/26/2018  . Dry mouth 09/26/2018  . Type 2 diabetes mellitus with stage 3 chronic kidney disease, without long-term current use of insulin ([place]) 03/30/2018  . CKD (chronic kidney disease)  stage 3, GFR 30-59 ml/min (HCC) 03/30/2018  . Hypertension associated with diabetes ([place]) 03/30/2018  . Glaucoma 03/30/2018  . Chronic pain of both shoulders 03/30/2018  . Allergic rhinitis 03/30/2018  . Anemia 03/30/2018   Past Surgical History:  Procedure Laterality Date  . ABDOMINAL HYSTERECTOMY    . blood transfusion    . BREAST SURGERY     Breast Reduction  . CERVICAL FUSION    . EYE SURGERY    . KNEE CARTILAGE SURGERY    . LUMBAR DISC SURGERY    . RETINOPATHY SURGERY    . SHOULDER SURGERY Bilateral    Rotator Cuff  . TRABECULECTOMY    . TUBAL LIGATION    . VITRECTOMY AND CATARACT    . WRIST SURGERY     Carpal Tunnel    Family History  Problem Relation Age of Onset  . Diabetes Mother   . Hypertension Mother   . Stroke Mother   . Diabetes Father   . Arthritis Sister   . Cancer Sister   . Heart attack Sister   . Cancer Brother   . Hypertension Daughter   . Cancer Son   . Stroke Maternal Grandmother   . Hypertension Maternal Grandmother   . Diabetes  Paternal Grandmother   . Heart attack Paternal Grandfather   . Cancer Sister   . Cancer Sister   . Stroke Sister   . Hypertension Sister   . Diabetes Brother   . Kidney disease Brother     Medications- reviewed and updated Current Outpatient Medications  Medication Sig Dispense Refill  . acetaZOLAMIDE (DIAMOX) 250 MG tablet     . Ascorbic Acid (VITAMIN C) 1000 MG tablet Take 1,000 mg by mouth daily.    Marland Kitchen aspirin 81 MG tablet Take 81 mg by mouth daily.    . Biotin 5000 MCG CAPS Take by mouth.    . Cholecalciferol (VITAMIN D3) 50 MCG (2000 UT) capsule Take by mouth.    . dorzolamide (TRUSOPT) 2 % ophthalmic solution Place into both eyes 3 (three) times daily.     Marland Kitchen glipiZIDE (GLUCOTROL) 10 MG tablet Take 2 tablets (20 mg total) by mouth 2 (two) times daily before a meal. 360 tablet 0  . GNP CINNAMON PO Take by mouth. Takes '2000mg'$  2 capfuls daily    . latanoprost (XALATAN) 0.005 % ophthalmic solution     .  lisinopril (ZESTRIL) 20 MG tablet Take 1 tablet (20 mg total) by mouth daily. 90 tablet 0  . Omega-3 Fatty Acids (FISH OIL) 1200 MG CAPS Take 2 capsules by mouth.    Marland Kitchen omeprazole (PRILOSEC) 40 MG capsule Take 40 mg by mouth daily.    . pioglitazone (ACTOS) 45 MG tablet Take 1 tablet (45 mg total) by mouth daily. 90 tablet 1  . timolol (BETIMOL) 0.5 % ophthalmic solution Place 1 drop into both eyes 2 (two) times daily.    Marland Kitchen torsemide (DEMADEX) 20 MG tablet Take 1 tablet (20 mg total) by mouth daily. 90 tablet 1   No current facility-administered medications for this visit.     Allergies-reviewed and updated Allergies  Allergen Reactions  . Amitriptyline   . Nsaids     Was told to avoid all NSAIDs due to kidney function    Social History   Socioeconomic History  . Marital status: Married    Spouse name: Not on file  . Number of children: Not on file  . Years of education: Not on file  . Highest education level: Not on file  Occupational History  . Not on file  Social Needs  . Financial resource strain: Not on file  . Food insecurity    Worry: Not on file    Inability: Not on file  . Transportation needs    Medical: Not on file    Non-medical: Not on file  Tobacco Use  . Smoking status: Never Smoker  . Smokeless tobacco: Never Used  Substance and Sexual Activity  . Alcohol use: Never    Frequency: Never  . Drug use: Never  . Sexual activity: Not on file  Lifestyle  . Physical activity    Days per week: Not on file    Minutes per session: Not on file  . Stress: Not on file  Relationships  . Social Herbalist on phone: Not on file    Gets together: Not on file    Attends religious service: Not on file    Active member of club or organization: Not on file    Attends meetings of clubs or organizations: Not on file    Relationship status: Not on file  Other Topics Concern  . Not on file  Social History Narrative  . Not on file  Objective:   Physical Exam: BP 114/64 (BP Location: Left Arm, Patient Position: Sitting, Cuff Size: Normal)   Pulse 62   Temp (!) 97.4 F (36.3 C) (Oral)   Ht 5' 5" (1.651 m)   Wt 178 lb (80.7 kg)   SpO2 97%   BMI 29.62 kg/m   Body mass index is 29.62 kg/m. Wt Readings from Last 3 Encounters:  09/26/18 178 lb (80.7 kg)  06/01/18 176 lb (79.8 kg)  03/30/18 169 lb 12 oz (77 kg)   Gen: NAD, resting comfortably HEENT: TMs normal bilaterally. OP clear. No thyromegaly noted.  CV: RRR with no murmurs appreciated Pulm: NWOB, CTAB with no crackles, wheezes, or rhonchi GI: Normal bowel sounds present. Soft, Nontender, Nondistended. MSK: no edema, cyanosis, or clubbing noted Skin: warm, dry Neuro: CN2-12 grossly intact. Strength 5/5 in upper and lower extremities. Reflexes symmetric and intact bilaterally. No apparent cognitive impairment.  Psych: Normal affect and thought content     Miyanna Wiersma M. Jerline Pain, MD 09/26/2018 12:36 PM

## 2018-09-29 NOTE — Progress Notes (Signed)
Please inform patient of the following:  Her kidney numbers are elevated and she is slightly anemic - do not have any prior numbers to compare to. All of her other labs are NORMAL. Blood sugar and cholesterol numbers are at goal. Would like for her to keep up the good work and we can recheck again in a year.   Algis Greenhouse. Jerline Pain, MD 09/29/2018 2:42 PM

## 2018-11-27 ENCOUNTER — Other Ambulatory Visit: Payer: Self-pay | Admitting: Family Medicine

## 2018-11-27 MED ORDER — LISINOPRIL 20 MG PO TABS: 20.0000 mg | ORAL_TABLET | Freq: Every day | ORAL | 0 refills | Status: DC

## 2018-11-27 MED ORDER — GLIPIZIDE 10 MG PO TABS: 20.0000 mg | ORAL_TABLET | Freq: Two times a day (BID) | ORAL | 0 refills | Status: DC

## 2018-11-27 NOTE — Telephone Encounter (Signed)
Pt request refill   glipiZIDE (GLUCOTROL) 10 MG tablet  lisinopril (ZESTRIL) 20 MG tablet  90 days  [id number] - [place], [place] - [address] AT [place] [address] [phone] (Phone) [phone] (Fax)

## 2019-01-16 ENCOUNTER — Other Ambulatory Visit: Payer: Self-pay | Admitting: Nephrology

## 2019-01-16 DIAGNOSIS — N1832 Chronic kidney disease, stage 3b: Secondary | ICD-10-CM

## 2019-01-23 ENCOUNTER — Other Ambulatory Visit (HOSPITAL_COMMUNITY): Payer: Self-pay | Admitting: *Deleted

## 2019-01-24 ENCOUNTER — Ambulatory Visit (HOSPITAL_COMMUNITY)
Admission: RE | Admit: 2019-01-24 | Discharge: 2019-01-24 | Disposition: A | Discharge status: Home / Self Care | Payer: Medicare Other | Source: Ambulatory Visit | Attending: Nephrology | Admitting: Nephrology

## 2019-01-24 ENCOUNTER — Other Ambulatory Visit: Payer: Self-pay

## 2019-01-24 VITALS — BP 94/71 | HR 55 | Temp 96.3°F | Resp 18

## 2019-01-24 DIAGNOSIS — N183 Chronic kidney disease, stage 3 unspecified: Secondary | ICD-10-CM | POA: Diagnosis not present

## 2019-01-24 LAB — POCT HEMOGLOBIN-HEMACUE: Hemoglobin: 9.3 g/dL — ABNORMAL LOW (ref 12.0–15.0)

## 2019-01-24 MED ORDER — SODIUM CHLORIDE 0.9 % IV SOLN
510.0000 mg | INTRAVENOUS | Status: DC
  Administered 2019-01-24: 510 mg via INTRAVENOUS
  Filled 2019-01-24: qty 17

## 2019-01-24 MED ORDER — EPOETIN ALFA-EPBX 10000 UNIT/ML IJ SOLN
10000.0000 [IU] | INTRAMUSCULAR | Status: DC
  Administered 2019-01-24: 10000 [IU] via SUBCUTANEOUS
  Filled 2019-01-24: qty 1

## 2019-01-24 NOTE — Discharge Instructions (Signed)
  Epoetin Alfa injection What is this medicine? EPOETIN ALFA (e POE e tin AL fa) helps your body make more red blood cells. This medicine is used to treat anemia caused by chronic kidney disease, cancer chemotherapy, or HIV-therapy. It may also be used before surgery if you have anemia. This medicine may be used for other purposes; ask your health care provider or pharmacist if you have questions. COMMON BRAND NAME(S): Epogen, Procrit, Retacrit What should I tell my health care provider before I take this medicine? They need to know if you have any of these conditions:  cancer  heart disease  high blood pressure  history of blood clots  history of stroke  low levels of folate, iron, or vitamin B12 in the blood  seizures  an unusual or allergic reaction to erythropoietin, albumin, benzyl alcohol, hamster proteins, other medicines, foods, dyes, or preservatives  pregnant or trying to get pregnant  breast-feeding How should I use this medicine? This medicine is for injection into a vein or under the skin. It is usually given by a health care professional in a hospital or clinic setting. If you get this medicine at home, you will be taught how to prepare and give this medicine. Use exactly as directed. Take your medicine at regular intervals. Do not take your medicine more often than directed. It is important that you put your used needles and syringes in a special sharps container. Do not put them in a trash can. If you do not have a sharps container, call your pharmacist or healthcare provider to get one. A special MedGuide will be given to you by the pharmacist with each prescription and refill. Be sure to read this information carefully each time. Talk to your pediatrician regarding the use of this medicine in children. While this drug may be prescribed for selected conditions, precautions do apply. Overdosage: If you think you have taken too much of this medicine contact a  poison control center or emergency room at once. NOTE: This medicine is only for you. Do not share this medicine with others. What if I miss a dose? If you miss a dose, take it as soon as you can. If it is almost time for your next dose, take only that dose. Do not take double or extra doses. What may interact with this medicine? Interactions have not been studied. This list may not describe all possible interactions. Give your health care provider a list of all the medicines, herbs, non-prescription drugs, or dietary supplements you use. Also tell them if you smoke, drink alcohol, or use illegal drugs. Some items may interact with your medicine. What should I watch for while using this medicine? Your condition will be monitored carefully while you are receiving this medicine. You may need blood work done while you are taking this medicine. This medicine may cause a decrease in vitamin B6. You should make sure that you get enough vitamin B6 while you are taking this medicine. Discuss the foods you eat and the vitamins you take with your health care professional. What side effects may I notice from receiving this medicine? Side effects that you should report to your doctor or health care professional as soon as possible:  allergic reactions like skin rash, itching or hives, swelling of the face, lips, or tongue  seizures  signs and symptoms of a blood clot such as breathing problems; changes in vision; chest pain; severe, sudden headache; pain, swelling, warmth in the leg; trouble speaking; sudden   numbness or weakness of the face, arm or leg  signs and symptoms of a stroke like changes in vision; confusion; trouble speaking or understanding; severe headaches; sudden numbness or weakness of the face, arm or leg; trouble walking; dizziness; loss of balance or coordination Side effects that usually do not require medical attention (report to your doctor or health care professional if they continue  or are bothersome):  chills  cough  dizziness  fever  headaches  joint pain  muscle cramps  muscle pain  nausea, vomiting  pain, redness, or irritation at site where injected This list may not describe all possible side effects. Call your doctor for medical advice about side effects. You may report side effects to FDA at 1-800-FDA-1088. Where should I keep my medicine? Keep out of the reach of children. Store in a refrigerator between 2 and 8 degrees C (36 and 46 degrees F). Do not freeze or shake. Throw away any unused portion if using a single-dose vial. Multi-dose vials can be kept in the refrigerator for up to 21 days after the initial dose. Throw away unused medicine. NOTE: This sheet is a summary. It may not cover all possible information. If you have questions about this medicine, talk to your doctor, pharmacist, or health care provider.  2020 Elsevier/Gold Standard (2016-10-08 08:35:19) Ferumoxytol injection What is this medicine? FERUMOXYTOL is an iron complex. Iron is used to make healthy red blood cells, which carry oxygen and nutrients throughout the body. This medicine is used to treat iron deficiency anemia. This medicine may be used for other purposes; ask your health care provider or pharmacist if you have questions. COMMON BRAND NAME(S): Feraheme What should I tell my health care provider before I take this medicine? They need to know if you have any of these conditions:  anemia not caused by low iron levels  high levels of iron in the blood  magnetic resonance imaging (MRI) test scheduled  an unusual or allergic reaction to iron, other medicines, foods, dyes, or preservatives  pregnant or trying to get pregnant  breast-feeding How should I use this medicine? This medicine is for injection into a vein. It is given by a health care professional in a hospital or clinic setting. Talk to your pediatrician regarding the use of this medicine in children.  Special care may be needed. Overdosage: If you think you have taken too much of this medicine contact a poison control center or emergency room at once. NOTE: This medicine is only for you. Do not share this medicine with others. What if I miss a dose? It is important not to miss your dose. Call your doctor or health care professional if you are unable to keep an appointment. What may interact with this medicine? This medicine may interact with the following medications:  other iron products This list may not describe all possible interactions. Give your health care provider a list of all the medicines, herbs, non-prescription drugs, or dietary supplements you use. Also tell them if you smoke, drink alcohol, or use illegal drugs. Some items may interact with your medicine. What should I watch for while using this medicine? Visit your doctor or healthcare professional regularly. Tell your doctor or healthcare professional if your symptoms do not start to get better or if they get worse. You may need blood work done while you are taking this medicine. You may need to follow a special diet. Talk to your doctor. Foods that contain iron include: whole grains/cereals, dried fruits,   beans, or peas, leafy green vegetables, and organ meats (liver, kidney). What side effects may I notice from receiving this medicine? Side effects that you should report to your doctor or health care professional as soon as possible:  allergic reactions like skin rash, itching or hives, swelling of the face, lips, or tongue  breathing problems  changes in blood pressure  feeling faint or lightheaded, falls  fever or chills  flushing, sweating, or hot feelings  swelling of the ankles or feet Side effects that usually do not require medical attention (report to your doctor or health care professional if they continue or are bothersome):  diarrhea  headache  nausea, vomiting  stomach pain This list may not  describe all possible side effects. Call your doctor for medical advice about side effects. You may report side effects to FDA at 1-800-FDA-1088. Where should I keep my medicine? This drug is given in a hospital or clinic and will not be stored at home. NOTE: This sheet is a summary. It may not cover all possible information. If you have questions about this medicine, talk to your doctor, pharmacist, or health care provider.  2020 Elsevier/Gold Standard (2016-04-19 20:21:10)  

## 2019-01-25 ENCOUNTER — Ambulatory Visit
Admission: RE | Admit: 2019-01-25 | Discharge: 2019-01-25 | Disposition: A | Discharge status: Home / Self Care | Payer: Medicare Other | Source: Ambulatory Visit | Attending: Nephrology | Admitting: Nephrology

## 2019-01-25 DIAGNOSIS — N1832 Chronic kidney disease, stage 3b: Secondary | ICD-10-CM

## 2019-01-31 ENCOUNTER — Other Ambulatory Visit: Payer: Self-pay

## 2019-01-31 ENCOUNTER — Encounter (HOSPITAL_COMMUNITY)
Admission: RE | Admit: 2019-01-31 | Discharge: 2019-01-31 | Disposition: A | Discharge status: Home / Self Care | Payer: Medicare Other | Source: Ambulatory Visit | Attending: Nephrology | Admitting: Nephrology

## 2019-01-31 DIAGNOSIS — N189 Chronic kidney disease, unspecified: Secondary | ICD-10-CM | POA: Insufficient documentation

## 2019-01-31 DIAGNOSIS — D631 Anemia in chronic kidney disease: Secondary | ICD-10-CM | POA: Insufficient documentation

## 2019-01-31 MED ORDER — SODIUM CHLORIDE 0.9 % IV SOLN
510.0000 mg | INTRAVENOUS | Status: DC
  Administered 2019-01-31: 510 mg via INTRAVENOUS
  Filled 2019-01-31: qty 510

## 2019-02-07 ENCOUNTER — Ambulatory Visit (HOSPITAL_COMMUNITY)
Admission: RE | Admit: 2019-02-07 | Discharge: 2019-02-07 | Disposition: A | Discharge status: Home / Self Care | Payer: Medicare Other | Source: Ambulatory Visit | Attending: Nephrology | Admitting: Nephrology

## 2019-02-07 ENCOUNTER — Other Ambulatory Visit: Payer: Self-pay

## 2019-02-07 VITALS — BP 91/60 | HR 73 | Temp 94.9°F | Resp 18

## 2019-02-07 DIAGNOSIS — N183 Chronic kidney disease, stage 3 unspecified: Secondary | ICD-10-CM | POA: Insufficient documentation

## 2019-02-07 LAB — POCT HEMOGLOBIN-HEMACUE: Hemoglobin: 11.6 g/dL — ABNORMAL LOW (ref 12.0–15.0)

## 2019-02-07 MED ORDER — EPOETIN ALFA-EPBX 10000 UNIT/ML IJ SOLN
10000.0000 [IU] | INTRAMUSCULAR | Status: DC
  Administered 2019-02-07: 10000 [IU] via SUBCUTANEOUS
  Filled 2019-02-07: qty 1

## 2019-02-21 ENCOUNTER — Ambulatory Visit (HOSPITAL_COMMUNITY)
Admission: RE | Admit: 2019-02-21 | Discharge: 2019-02-21 | Disposition: A | Discharge status: Home / Self Care | Payer: Medicare Other | Source: Ambulatory Visit | Attending: Nephrology | Admitting: Nephrology

## 2019-02-21 ENCOUNTER — Other Ambulatory Visit: Payer: Self-pay

## 2019-02-21 VITALS — BP 103/65 | HR 63 | Temp 95.3°F | Resp 18

## 2019-02-21 DIAGNOSIS — N183 Chronic kidney disease, stage 3 unspecified: Secondary | ICD-10-CM | POA: Diagnosis present

## 2019-02-21 DIAGNOSIS — D631 Anemia in chronic kidney disease: Secondary | ICD-10-CM | POA: Insufficient documentation

## 2019-02-21 LAB — IRON AND TIBC
Iron: 73 ug/dL (ref 28–170)
Saturation Ratios: 24 % (ref 10.4–31.8)
TIBC: 300 ug/dL (ref 250–450)
UIBC: 227 ug/dL

## 2019-02-21 LAB — POCT HEMOGLOBIN-HEMACUE: Hemoglobin: 11 g/dL — ABNORMAL LOW (ref 12.0–15.0)

## 2019-02-21 LAB — FERRITIN: Ferritin: 445 ng/mL — ABNORMAL HIGH (ref 11–307)

## 2019-02-21 MED ORDER — EPOETIN ALFA-EPBX 10000 UNIT/ML IJ SOLN
10000.0000 [IU] | INTRAMUSCULAR | Status: DC
  Administered 2019-02-21: 10000 [IU] via SUBCUTANEOUS

## 2019-02-25 ENCOUNTER — Other Ambulatory Visit: Payer: Self-pay | Admitting: Family Medicine

## 2019-03-04 ENCOUNTER — Other Ambulatory Visit: Payer: Self-pay | Admitting: Family Medicine

## 2019-03-07 ENCOUNTER — Encounter (HOSPITAL_COMMUNITY)
Admission: RE | Admit: 2019-03-07 | Discharge: 2019-03-07 | Disposition: A | Discharge status: Home / Self Care | Payer: Medicare Other | Source: Ambulatory Visit | Attending: Nephrology | Admitting: Nephrology

## 2019-03-07 ENCOUNTER — Other Ambulatory Visit: Payer: Self-pay

## 2019-03-07 VITALS — BP 104/59 | HR 69 | Temp 94.8°F | Resp 20

## 2019-03-07 DIAGNOSIS — N183 Chronic kidney disease, stage 3 unspecified: Secondary | ICD-10-CM | POA: Diagnosis present

## 2019-03-07 LAB — POCT HEMOGLOBIN-HEMACUE: Hemoglobin: 11.3 g/dL — ABNORMAL LOW (ref 12.0–15.0)

## 2019-03-07 MED ORDER — EPOETIN ALFA-EPBX 10000 UNIT/ML IJ SOLN
10000.0000 [IU] | INTRAMUSCULAR | Status: DC
  Administered 2019-03-07: 10000 [IU] via SUBCUTANEOUS

## 2019-03-07 MED ORDER — EPOETIN ALFA-EPBX 10000 UNIT/ML IJ SOLN
INTRAMUSCULAR | Status: AC
  Filled 2019-03-07: qty 1

## 2019-03-21 ENCOUNTER — Other Ambulatory Visit: Payer: Self-pay

## 2019-03-21 ENCOUNTER — Ambulatory Visit (HOSPITAL_COMMUNITY)
Admission: RE | Admit: 2019-03-21 | Discharge: 2019-03-21 | Disposition: A | Discharge status: Home / Self Care | Payer: Medicare Other | Source: Ambulatory Visit | Attending: Nephrology | Admitting: Nephrology

## 2019-03-21 VITALS — BP 128/65 | HR 60 | Temp 94.4°F | Resp 20

## 2019-03-21 DIAGNOSIS — N183 Chronic kidney disease, stage 3 unspecified: Secondary | ICD-10-CM | POA: Diagnosis not present

## 2019-03-21 DIAGNOSIS — D631 Anemia in chronic kidney disease: Secondary | ICD-10-CM | POA: Diagnosis not present

## 2019-03-21 LAB — IRON AND TIBC
Iron: 114 ug/dL (ref 28–170)
Saturation Ratios: 37 % — ABNORMAL HIGH (ref 10.4–31.8)
TIBC: 305 ug/dL (ref 250–450)
UIBC: 191 ug/dL

## 2019-03-21 LAB — FERRITIN: Ferritin: 303 ng/mL (ref 11–307)

## 2019-03-21 LAB — POCT HEMOGLOBIN-HEMACUE: Hemoglobin: 11.3 g/dL — ABNORMAL LOW (ref 12.0–15.0)

## 2019-03-21 MED ORDER — EPOETIN ALFA-EPBX 10000 UNIT/ML IJ SOLN
10000.0000 [IU] | INTRAMUSCULAR | Status: DC
  Administered 2019-03-21: 10000 [IU] via SUBCUTANEOUS

## 2019-03-21 MED ORDER — EPOETIN ALFA-EPBX 10000 UNIT/ML IJ SOLN
INTRAMUSCULAR | Status: AC
  Filled 2019-03-21: qty 1

## 2019-04-04 ENCOUNTER — Ambulatory Visit (HOSPITAL_COMMUNITY)
Admission: RE | Admit: 2019-04-04 | Discharge: 2019-04-04 | Disposition: A | Discharge status: Home / Self Care | Payer: Medicare Other | Source: Ambulatory Visit | Attending: Nephrology | Admitting: Nephrology

## 2019-04-04 ENCOUNTER — Other Ambulatory Visit: Payer: Self-pay

## 2019-04-04 VITALS — BP 107/59 | HR 62 | Temp 94.2°F | Resp 20

## 2019-04-04 DIAGNOSIS — N183 Chronic kidney disease, stage 3 unspecified: Secondary | ICD-10-CM | POA: Insufficient documentation

## 2019-04-04 LAB — POCT HEMOGLOBIN-HEMACUE: Hemoglobin: 11 g/dL — ABNORMAL LOW (ref 12.0–15.0)

## 2019-04-04 MED ORDER — EPOETIN ALFA-EPBX 10000 UNIT/ML IJ SOLN
10000.0000 [IU] | INTRAMUSCULAR | Status: DC
  Administered 2019-04-04: 10000 [IU] via SUBCUTANEOUS

## 2019-04-18 ENCOUNTER — Encounter (HOSPITAL_COMMUNITY): Payer: Medicare Other

## 2019-04-19 ENCOUNTER — Other Ambulatory Visit: Payer: Self-pay

## 2019-04-19 ENCOUNTER — Encounter (HOSPITAL_COMMUNITY)
Admission: RE | Admit: 2019-04-19 | Discharge: 2019-04-19 | Disposition: A | Discharge status: Home / Self Care | Payer: Medicare Other | Source: Ambulatory Visit | Attending: Nephrology | Admitting: Nephrology

## 2019-04-19 VITALS — BP 104/51 | HR 68 | Temp 96.0°F | Resp 20

## 2019-04-19 DIAGNOSIS — D631 Anemia in chronic kidney disease: Secondary | ICD-10-CM | POA: Diagnosis not present

## 2019-04-19 DIAGNOSIS — N183 Chronic kidney disease, stage 3 unspecified: Secondary | ICD-10-CM

## 2019-04-19 LAB — POCT HEMOGLOBIN-HEMACUE: Hemoglobin: 10.5 g/dL — ABNORMAL LOW (ref 12.0–15.0)

## 2019-04-19 LAB — IRON AND TIBC
Iron: 115 ug/dL (ref 28–170)
Saturation Ratios: 38 % — ABNORMAL HIGH (ref 10.4–31.8)
TIBC: 301 ug/dL (ref 250–450)
UIBC: 186 ug/dL

## 2019-04-19 LAB — FERRITIN: Ferritin: 222 ng/mL (ref 11–307)

## 2019-04-19 MED ORDER — EPOETIN ALFA-EPBX 10000 UNIT/ML IJ SOLN
INTRAMUSCULAR | Status: AC
  Filled 2019-04-19: qty 1

## 2019-04-19 MED ORDER — EPOETIN ALFA-EPBX 10000 UNIT/ML IJ SOLN
10000.0000 [IU] | INTRAMUSCULAR | Status: DC
  Administered 2019-04-19: 10000 [IU] via SUBCUTANEOUS

## 2019-05-03 ENCOUNTER — Encounter (HOSPITAL_COMMUNITY): Payer: Medicare Other

## 2019-05-07 ENCOUNTER — Ambulatory Visit (HOSPITAL_COMMUNITY)
Admission: RE | Admit: 2019-05-07 | Discharge: 2019-05-07 | Disposition: A | Discharge status: Home / Self Care | Payer: Medicare Other | Source: Ambulatory Visit | Attending: Nephrology | Admitting: Nephrology

## 2019-05-07 ENCOUNTER — Other Ambulatory Visit: Payer: Self-pay

## 2019-05-07 VITALS — BP 108/47 | HR 66 | Temp 94.8°F | Resp 20

## 2019-05-07 DIAGNOSIS — N183 Chronic kidney disease, stage 3 unspecified: Secondary | ICD-10-CM | POA: Diagnosis present

## 2019-05-07 LAB — POCT HEMOGLOBIN-HEMACUE: Hemoglobin: 11.4 g/dL — ABNORMAL LOW (ref 12.0–15.0)

## 2019-05-07 MED ORDER — EPOETIN ALFA-EPBX 10000 UNIT/ML IJ SOLN
10000.0000 [IU] | INTRAMUSCULAR | Status: DC
  Administered 2019-05-07: 10000 [IU] via SUBCUTANEOUS

## 2019-05-07 MED ORDER — EPOETIN ALFA-EPBX 10000 UNIT/ML IJ SOLN
INTRAMUSCULAR | Status: AC
  Filled 2019-05-07: qty 1

## 2019-05-15 LAB — BASIC METABOLIC PANEL
BUN: 57 — AB (ref 4–21)
CO2: 18 (ref 13–22)
Chloride: 107 (ref 99–108)
Creatinine: 1.7 — AB (ref 0.5–1.1)
Glucose: 184
Potassium: 4.5 (ref 3.4–5.3)
Sodium: 141 (ref 137–147)

## 2019-05-15 LAB — IRON,TIBC AND FERRITIN PANEL
%SAT: 26
Ferritin: 328
Iron: 75
TIBC: 287
UIBC: 212

## 2019-05-15 LAB — CBC AND DIFFERENTIAL
HCT: 34 — AB (ref 36–46)
Hemoglobin: 11.5 — AB (ref 12.0–16.0)
Neutrophils Absolute: 4
Platelets: 185 (ref 150–399)
WBC: 5.2

## 2019-05-15 LAB — COMPREHENSIVE METABOLIC PANEL
Albumin: 4.1 (ref 3.5–5.0)
Calcium: 8.8 (ref 8.7–10.7)
GFR calc Af Amer: 36
GFR calc non Af Amer: 31

## 2019-05-15 LAB — CBC: RBC: 3.91 (ref 3.87–5.11)

## 2019-05-21 ENCOUNTER — Encounter (HOSPITAL_COMMUNITY): Payer: Medicare Other

## 2019-05-23 ENCOUNTER — Encounter (HOSPITAL_COMMUNITY)
Admission: RE | Admit: 2019-05-23 | Discharge: 2019-05-23 | Disposition: A | Discharge status: Home / Self Care | Payer: Medicare Other | Source: Ambulatory Visit | Attending: Nephrology | Admitting: Nephrology

## 2019-05-23 ENCOUNTER — Other Ambulatory Visit: Payer: Self-pay

## 2019-05-23 VITALS — BP 99/46 | HR 62 | Temp 96.1°F | Resp 20

## 2019-05-23 DIAGNOSIS — D631 Anemia in chronic kidney disease: Secondary | ICD-10-CM | POA: Diagnosis not present

## 2019-05-23 DIAGNOSIS — N183 Chronic kidney disease, stage 3 unspecified: Secondary | ICD-10-CM | POA: Insufficient documentation

## 2019-05-23 LAB — FERRITIN: Ferritin: 216 ng/mL (ref 11–307)

## 2019-05-23 LAB — IRON AND TIBC
Iron: 98 ug/dL (ref 28–170)
Saturation Ratios: 28 % (ref 10.4–31.8)
TIBC: 347 ug/dL (ref 250–450)
UIBC: 249 ug/dL

## 2019-05-23 LAB — POCT HEMOGLOBIN-HEMACUE: Hemoglobin: 10.4 g/dL — ABNORMAL LOW (ref 12.0–15.0)

## 2019-05-23 MED ORDER — EPOETIN ALFA-EPBX 10000 UNIT/ML IJ SOLN
INTRAMUSCULAR | Status: AC
  Filled 2019-05-23: qty 1

## 2019-05-23 MED ORDER — EPOETIN ALFA-EPBX 10000 UNIT/ML IJ SOLN
10000.0000 [IU] | INTRAMUSCULAR | Status: DC
  Administered 2019-05-23: 10000 [IU] via SUBCUTANEOUS

## 2019-05-24 ENCOUNTER — Encounter (HOSPITAL_COMMUNITY): Payer: Medicare Other

## 2019-06-03 ENCOUNTER — Other Ambulatory Visit: Payer: Self-pay | Admitting: Family Medicine

## 2019-06-06 ENCOUNTER — Encounter (HOSPITAL_COMMUNITY): Payer: Medicare Other

## 2019-06-07 ENCOUNTER — Ambulatory Visit (HOSPITAL_COMMUNITY)
Admission: RE | Admit: 2019-06-07 | Discharge: 2019-06-07 | Disposition: A | Discharge status: Home / Self Care | Payer: Medicare Other | Source: Ambulatory Visit | Attending: Nephrology | Admitting: Nephrology

## 2019-06-07 ENCOUNTER — Other Ambulatory Visit: Payer: Self-pay

## 2019-06-07 VITALS — BP 113/44 | HR 57 | Temp 95.6°F | Resp 20

## 2019-06-07 DIAGNOSIS — N183 Chronic kidney disease, stage 3 unspecified: Secondary | ICD-10-CM | POA: Insufficient documentation

## 2019-06-07 LAB — POCT HEMOGLOBIN-HEMACUE: Hemoglobin: 10.3 g/dL — ABNORMAL LOW (ref 12.0–15.0)

## 2019-06-07 MED ORDER — EPOETIN ALFA-EPBX 10000 UNIT/ML IJ SOLN
INTRAMUSCULAR | Status: AC
  Administered 2019-06-07: 10000 [IU]
  Filled 2019-06-07: qty 1

## 2019-06-07 MED ORDER — EPOETIN ALFA-EPBX 10000 UNIT/ML IJ SOLN: 10000.0000 [IU] | INTRAMUSCULAR | Status: DC

## 2019-06-21 ENCOUNTER — Other Ambulatory Visit: Payer: Self-pay

## 2019-06-21 ENCOUNTER — Encounter (HOSPITAL_COMMUNITY)
Admission: RE | Admit: 2019-06-21 | Discharge: 2019-06-21 | Disposition: A | Discharge status: Home / Self Care | Payer: Medicare Other | Source: Ambulatory Visit | Attending: Nephrology | Admitting: Nephrology

## 2019-06-21 VITALS — BP 98/55 | HR 55 | Temp 96.0°F | Resp 20

## 2019-06-21 DIAGNOSIS — N183 Chronic kidney disease, stage 3 unspecified: Secondary | ICD-10-CM | POA: Diagnosis not present

## 2019-06-21 DIAGNOSIS — D631 Anemia in chronic kidney disease: Secondary | ICD-10-CM | POA: Insufficient documentation

## 2019-06-21 LAB — IRON AND TIBC
Iron: 72 ug/dL (ref 28–170)
Saturation Ratios: 20 % (ref 10.4–31.8)
TIBC: 353 ug/dL (ref 250–450)
UIBC: 281 ug/dL

## 2019-06-21 LAB — POCT HEMOGLOBIN-HEMACUE: Hemoglobin: 10.5 g/dL — ABNORMAL LOW (ref 12.0–15.0)

## 2019-06-21 LAB — FERRITIN: Ferritin: 180 ng/mL (ref 11–307)

## 2019-06-21 MED ORDER — EPOETIN ALFA-EPBX 10000 UNIT/ML IJ SOLN
INTRAMUSCULAR | Status: AC
  Administered 2019-06-21: 10000 [IU] via SUBCUTANEOUS
  Filled 2019-06-21: qty 1

## 2019-06-21 MED ORDER — EPOETIN ALFA-EPBX 10000 UNIT/ML IJ SOLN: 10000.0000 [IU] | INTRAMUSCULAR | Status: DC

## 2019-07-05 ENCOUNTER — Ambulatory Visit (HOSPITAL_COMMUNITY)
Admission: RE | Admit: 2019-07-05 | Discharge: 2019-07-05 | Disposition: A | Discharge status: Home / Self Care | Payer: Medicare Other | Source: Ambulatory Visit | Attending: Nephrology | Admitting: Nephrology

## 2019-07-05 VITALS — BP 109/55 | HR 59 | Temp 95.4°F | Resp 20

## 2019-07-05 DIAGNOSIS — N183 Chronic kidney disease, stage 3 unspecified: Secondary | ICD-10-CM

## 2019-07-05 LAB — POCT HEMOGLOBIN-HEMACUE: Hemoglobin: 11.2 g/dL — ABNORMAL LOW (ref 12.0–15.0)

## 2019-07-05 MED ORDER — EPOETIN ALFA-EPBX 10000 UNIT/ML IJ SOLN: 10000.0000 [IU] | INTRAMUSCULAR | Status: DC

## 2019-07-05 MED ORDER — EPOETIN ALFA-EPBX 10000 UNIT/ML IJ SOLN
INTRAMUSCULAR | Status: AC
  Administered 2019-07-05: 10000 [IU]
  Filled 2019-07-05: qty 1

## 2019-07-19 ENCOUNTER — Other Ambulatory Visit: Payer: Self-pay

## 2019-07-19 ENCOUNTER — Encounter (HOSPITAL_COMMUNITY)
Admission: RE | Admit: 2019-07-19 | Discharge: 2019-07-19 | Disposition: A | Discharge status: Home / Self Care | Payer: Medicare Other | Source: Ambulatory Visit | Attending: Nephrology | Admitting: Nephrology

## 2019-07-19 VITALS — BP 117/56 | HR 69 | Temp 94.7°F | Resp 20

## 2019-07-19 DIAGNOSIS — D631 Anemia in chronic kidney disease: Secondary | ICD-10-CM | POA: Insufficient documentation

## 2019-07-19 DIAGNOSIS — N183 Chronic kidney disease, stage 3 unspecified: Secondary | ICD-10-CM

## 2019-07-19 LAB — IRON AND TIBC
Iron: 96 ug/dL (ref 28–170)
Saturation Ratios: 27 % (ref 10.4–31.8)
TIBC: 351 ug/dL (ref 250–450)
UIBC: 255 ug/dL

## 2019-07-19 LAB — POCT HEMOGLOBIN-HEMACUE: Hemoglobin: 11.6 g/dL — ABNORMAL LOW (ref 12.0–15.0)

## 2019-07-19 LAB — FERRITIN: Ferritin: 175 ng/mL (ref 11–307)

## 2019-07-19 MED ORDER — EPOETIN ALFA-EPBX 10000 UNIT/ML IJ SOLN
10000.0000 [IU] | INTRAMUSCULAR | Status: DC
  Administered 2019-07-19: 10000 [IU] via SUBCUTANEOUS

## 2019-07-19 MED ORDER — EPOETIN ALFA-EPBX 10000 UNIT/ML IJ SOLN
INTRAMUSCULAR | Status: AC
  Filled 2019-07-19: qty 1

## 2019-08-02 ENCOUNTER — Other Ambulatory Visit: Payer: Self-pay

## 2019-08-02 ENCOUNTER — Encounter (HOSPITAL_COMMUNITY)
Admission: RE | Admit: 2019-08-02 | Discharge: 2019-08-02 | Disposition: A | Discharge status: Home / Self Care | Payer: Medicare Other | Source: Ambulatory Visit | Attending: Nephrology | Admitting: Nephrology

## 2019-08-02 VITALS — BP 144/66 | HR 65 | Resp 18

## 2019-08-02 DIAGNOSIS — N183 Chronic kidney disease, stage 3 unspecified: Secondary | ICD-10-CM | POA: Diagnosis not present

## 2019-08-02 LAB — POCT HEMOGLOBIN-HEMACUE: Hemoglobin: 11.6 g/dL — ABNORMAL LOW (ref 12.0–15.0)

## 2019-08-02 MED ORDER — EPOETIN ALFA-EPBX 10000 UNIT/ML IJ SOLN: 10000.0000 [IU] | INTRAMUSCULAR | Status: DC

## 2019-08-02 MED ORDER — EPOETIN ALFA-EPBX 10000 UNIT/ML IJ SOLN
INTRAMUSCULAR | Status: AC
  Administered 2019-08-02: 10000 [IU] via SUBCUTANEOUS
  Filled 2019-08-02: qty 1

## 2019-08-15 ENCOUNTER — Ambulatory Visit (HOSPITAL_COMMUNITY)
Admission: RE | Admit: 2019-08-15 | Discharge: 2019-08-15 | Disposition: A | Discharge status: Home / Self Care | Payer: Medicare Other | Source: Ambulatory Visit | Attending: Nephrology | Admitting: Nephrology

## 2019-08-15 ENCOUNTER — Other Ambulatory Visit: Payer: Self-pay

## 2019-08-15 VITALS — BP 112/63 | HR 65 | Temp 96.0°F | Resp 18

## 2019-08-15 DIAGNOSIS — D631 Anemia in chronic kidney disease: Secondary | ICD-10-CM | POA: Diagnosis not present

## 2019-08-15 DIAGNOSIS — N183 Chronic kidney disease, stage 3 unspecified: Secondary | ICD-10-CM | POA: Diagnosis present

## 2019-08-15 LAB — IRON AND TIBC
Iron: 59 ug/dL (ref 28–170)
Saturation Ratios: 17 % (ref 10.4–31.8)
TIBC: 351 ug/dL (ref 250–450)
UIBC: 292 ug/dL

## 2019-08-15 LAB — POCT HEMOGLOBIN-HEMACUE: Hemoglobin: 11.7 g/dL — ABNORMAL LOW (ref 12.0–15.0)

## 2019-08-15 LAB — FERRITIN: Ferritin: 153 ng/mL (ref 11–307)

## 2019-08-15 MED ORDER — EPOETIN ALFA-EPBX 10000 UNIT/ML IJ SOLN
INTRAMUSCULAR | Status: AC
  Filled 2019-08-15: qty 1

## 2019-08-15 MED ORDER — EPOETIN ALFA-EPBX 10000 UNIT/ML IJ SOLN
10000.0000 [IU] | INTRAMUSCULAR | Status: DC
  Administered 2019-08-15: 10000 [IU] via SUBCUTANEOUS

## 2019-08-16 ENCOUNTER — Encounter (HOSPITAL_COMMUNITY): Payer: Medicare Other

## 2019-08-20 ENCOUNTER — Other Ambulatory Visit: Payer: Self-pay | Admitting: Family Medicine

## 2019-08-27 ENCOUNTER — Encounter (HOSPITAL_COMMUNITY): Payer: Medicare Other

## 2019-08-28 ENCOUNTER — Other Ambulatory Visit (HOSPITAL_COMMUNITY): Payer: Self-pay

## 2019-08-29 ENCOUNTER — Other Ambulatory Visit: Payer: Self-pay

## 2019-08-29 ENCOUNTER — Ambulatory Visit (HOSPITAL_COMMUNITY)
Admission: RE | Admit: 2019-08-29 | Discharge: 2019-08-29 | Disposition: A | Discharge status: Home / Self Care | Payer: Medicare Other | Source: Ambulatory Visit | Attending: Nephrology | Admitting: Nephrology

## 2019-08-29 VITALS — BP 100/57 | HR 52 | Temp 95.5°F | Ht 65.0 in | Wt 180.0 lb

## 2019-08-29 DIAGNOSIS — N183 Chronic kidney disease, stage 3 unspecified: Secondary | ICD-10-CM | POA: Diagnosis not present

## 2019-08-29 LAB — POCT HEMOGLOBIN-HEMACUE: Hemoglobin: 11.1 g/dL — ABNORMAL LOW (ref 12.0–15.0)

## 2019-08-29 MED ORDER — EPOETIN ALFA-EPBX 10000 UNIT/ML IJ SOLN
10000.0000 [IU] | INTRAMUSCULAR | Status: DC
  Administered 2019-08-29: 10000 [IU] via SUBCUTANEOUS

## 2019-08-29 MED ORDER — EPOETIN ALFA-EPBX 10000 UNIT/ML IJ SOLN
INTRAMUSCULAR | Status: AC
  Filled 2019-08-29: qty 1

## 2019-08-29 MED ORDER — SODIUM CHLORIDE 0.9 % IV SOLN
510.0000 mg | INTRAVENOUS | Status: DC
  Administered 2019-08-29: 510 mg via INTRAVENOUS
  Filled 2019-08-29: qty 17

## 2019-09-05 ENCOUNTER — Other Ambulatory Visit: Payer: Self-pay | Admitting: Family Medicine

## 2019-09-12 ENCOUNTER — Encounter (HOSPITAL_COMMUNITY): Payer: Medicare Other

## 2019-09-12 ENCOUNTER — Other Ambulatory Visit: Payer: Self-pay | Admitting: Family Medicine

## 2019-09-12 ENCOUNTER — Other Ambulatory Visit: Payer: Self-pay

## 2019-09-12 ENCOUNTER — Ambulatory Visit (HOSPITAL_COMMUNITY)
Admission: RE | Admit: 2019-09-12 | Discharge: 2019-09-12 | Disposition: A | Discharge status: Home / Self Care | Payer: Medicare Other | Source: Ambulatory Visit | Attending: Nephrology | Admitting: Nephrology

## 2019-09-12 VITALS — BP 104/51 | HR 52 | Temp 97.6°F

## 2019-09-12 DIAGNOSIS — N183 Chronic kidney disease, stage 3 unspecified: Secondary | ICD-10-CM | POA: Diagnosis not present

## 2019-09-12 LAB — POCT HEMOGLOBIN-HEMACUE: Hemoglobin: 10.8 g/dL — ABNORMAL LOW (ref 12.0–15.0)

## 2019-09-12 MED ORDER — SODIUM CHLORIDE 0.9 % IV SOLN
510.0000 mg | INTRAVENOUS | Status: DC
  Administered 2019-09-12: 510 mg via INTRAVENOUS
  Filled 2019-09-12: qty 17

## 2019-09-12 MED ORDER — EPOETIN ALFA-EPBX 10000 UNIT/ML IJ SOLN
10000.0000 [IU] | INTRAMUSCULAR | Status: DC
  Administered 2019-09-12: 10000 [IU] via SUBCUTANEOUS

## 2019-09-12 MED ORDER — EPOETIN ALFA-EPBX 10000 UNIT/ML IJ SOLN
INTRAMUSCULAR | Status: AC
  Filled 2019-09-12: qty 1

## 2019-09-26 ENCOUNTER — Encounter (HOSPITAL_COMMUNITY)
Admission: RE | Admit: 2019-09-26 | Discharge: 2019-09-26 | Disposition: A | Discharge status: Home / Self Care | Payer: Medicare Other | Source: Ambulatory Visit | Attending: Nephrology | Admitting: Nephrology

## 2019-09-26 ENCOUNTER — Other Ambulatory Visit: Payer: Self-pay

## 2019-09-26 VITALS — BP 85/50 | HR 65 | Temp 97.2°F

## 2019-09-26 DIAGNOSIS — N183 Chronic kidney disease, stage 3 unspecified: Secondary | ICD-10-CM | POA: Diagnosis present

## 2019-09-26 LAB — IRON AND TIBC
Iron: 115 ug/dL (ref 28–170)
Saturation Ratios: 35 % — ABNORMAL HIGH (ref 10.4–31.8)
TIBC: 329 ug/dL (ref 250–450)
UIBC: 214 ug/dL

## 2019-09-26 LAB — POCT HEMOGLOBIN-HEMACUE: Hemoglobin: 13 g/dL (ref 12.0–15.0)

## 2019-09-26 LAB — FERRITIN: Ferritin: 605 ng/mL — ABNORMAL HIGH (ref 11–307)

## 2019-09-26 MED ORDER — EPOETIN ALFA-EPBX 10000 UNIT/ML IJ SOLN: 10000.0000 [IU] | INTRAMUSCULAR | Status: DC

## 2019-10-10 ENCOUNTER — Encounter (HOSPITAL_COMMUNITY)
Admission: RE | Admit: 2019-10-10 | Discharge: 2019-10-10 | Disposition: A | Discharge status: Home / Self Care | Payer: Medicare Other | Source: Ambulatory Visit | Attending: Nephrology | Admitting: Nephrology

## 2019-10-10 ENCOUNTER — Encounter (HOSPITAL_COMMUNITY): Payer: Medicare Other

## 2019-10-10 VITALS — BP 110/51 | HR 55 | Temp 97.3°F | Resp 20

## 2019-10-10 DIAGNOSIS — N183 Chronic kidney disease, stage 3 unspecified: Secondary | ICD-10-CM

## 2019-10-10 LAB — POCT HEMOGLOBIN-HEMACUE: Hemoglobin: 10.7 g/dL — ABNORMAL LOW (ref 12.0–15.0)

## 2019-10-10 MED ORDER — EPOETIN ALFA-EPBX 10000 UNIT/ML IJ SOLN
10000.0000 [IU] | INTRAMUSCULAR | Status: DC
  Administered 2019-10-10: 10000 [IU] via SUBCUTANEOUS

## 2019-10-10 MED ORDER — EPOETIN ALFA-EPBX 10000 UNIT/ML IJ SOLN
INTRAMUSCULAR | Status: AC
  Filled 2019-10-10: qty 1

## 2019-10-12 ENCOUNTER — Ambulatory Visit (INDEPENDENT_AMBULATORY_CARE_PROVIDER_SITE_OTHER): Payer: Medicare Other

## 2019-10-12 VITALS — BP 120/64 | HR 63 | Resp 20 | Ht 65.0 in | Wt 188.6 lb

## 2019-10-12 DIAGNOSIS — Z0000 Encounter for general adult medical examination without abnormal findings: Secondary | ICD-10-CM

## 2019-10-12 DIAGNOSIS — Q782 Osteopetrosis: Secondary | ICD-10-CM

## 2019-10-12 DIAGNOSIS — E2839 Other primary ovarian failure: Secondary | ICD-10-CM

## 2019-10-12 DIAGNOSIS — Z1231 Encounter for screening mammogram for malignant neoplasm of breast: Secondary | ICD-10-CM

## 2019-10-12 NOTE — Patient Instructions (Addendum)
Brandi Cooper , Thank you for taking time to come for your Medicare Wellness Visit. I appreciate your ongoing commitment to your health goals. Please review the following plan we discussed and let me know if I can assist you in the future.   Screening recommendations/referrals: Colonoscopy: Done 12/05/15 Mammogram: Ordered Bone Density: Ordered Recommended yearly ophthalmology/optometry visit for glaucoma screening and checkup Recommended yearly dental visit for hygiene and checkup  Vaccinations: Influenza vaccine: Decline Pneumococcal vaccine: Decline Tdap vaccine: Pt stated Up to date Shingles vaccine: Shingrix discussed. Please contact your pharmacy for coverage information.    Covid-19:Pt will call back with correct dates  Advanced directives: Advance directive discussed with you today. I have provided a copy for you to complete at home and have notarized. Once this is complete please bring a copy in to our office so we can scan it into your chart.  Conditions/risks identified: [employer] [place]  Next appointment: Follow up in one year for your annual wellness visit     Preventive Care 65 Years and Older, Female Preventive care refers to lifestyle choices and visits with your health care provider that can promote health and wellness. What does preventive care include?  A yearly physical exam. This is also called an annual well check.  Dental exams once or twice a year.  Routine eye exams. Ask your health care provider how often you should have your eyes checked.  Personal lifestyle choices, including:  Daily care of your teeth and gums.  Regular physical activity.  Eating a healthy diet.  Avoiding tobacco and drug use.  Limiting alcohol use.  Practicing safe sex.  Taking low-dose aspirin every day.  Taking vitamin and mineral supplements as recommended by your health care provider. What happens during an annual well check? The services and  screenings done by your health care provider during your annual well check will depend on your age, overall health, lifestyle risk factors, and family history of disease. Counseling  Your health care provider may ask you questions about your:  Alcohol use.  Tobacco use.  Drug use.  Emotional well-being.  Home and relationship well-being.  Sexual activity.  Eating habits.  History of falls.  Memory and ability to understand (cognition).  Work and work Astronomer.  Reproductive health. Screening  You may have the following tests or measurements:  Height, weight, and BMI.  Blood pressure.  Lipid and cholesterol levels. These may be checked every 5 years, or more frequently if you are over 38 years old.  Skin check.  Lung cancer screening. You may have this screening every year starting at age 80 if you have a 30-pack-year history of smoking and currently smoke or have quit within the past 15 years.  Fecal occult blood test (FOBT) of the stool. You may have this test every year starting at age 16.  Flexible sigmoidoscopy or colonoscopy. You may have a sigmoidoscopy every 5 years or a colonoscopy every 10 years starting at age 13.  Hepatitis C blood test.  Hepatitis B blood test.  Sexually transmitted disease (STD) testing.  Diabetes screening. This is done by checking your blood sugar (glucose) after you have not eaten for a while (fasting). You may have this done every 1-3 years.  Bone density scan. This is done to screen for osteoporosis. You may have this done starting at age 53.  Mammogram. This may be done every 1-2 years. Talk to your health care provider about how often you should have regular mammograms.  Talk with your health care provider about your test results, treatment options, and if necessary, the need for more tests. Vaccines  Your health care provider may recommend certain vaccines, such as:  Influenza vaccine. This is recommended every  year.  Tetanus, diphtheria, and acellular pertussis (Tdap, Td) vaccine. You may need a Td booster every 10 years.  Zoster vaccine. You may need this after age 42.  Pneumococcal 13-valent conjugate (PCV13) vaccine. One dose is recommended after age 87.  Pneumococcal polysaccharide (PPSV23) vaccine. One dose is recommended after age 55. Talk to your health care provider about which screenings and vaccines you need and how often you need them. This information is not intended to replace advice given to you by your health care provider. Make sure you discuss any questions you have with your health care provider. Document Released: 03/28/2015 Document Revised: 11/19/2015 Document Reviewed: 12/31/2014 Elsevier Interactive Patient Education  2017 [place] Prevention in the Home Falls can cause injuries. They can happen to people of all ages. There are many things you can do to make your home safe and to help prevent falls. What can I do on the outside of my home?  Regularly fix the edges of walkways and driveways and fix any cracks.  Remove anything that might make you trip as you walk through a door, such as a raised step or threshold.  Trim any bushes or trees on the path to your home.  Use bright outdoor lighting.  Clear any walking paths of anything that might make someone trip, such as rocks or tools.  Regularly check to see if handrails are loose or broken. Make sure that both sides of any steps have handrails.  Any raised decks and porches should have guardrails on the edges.  Have any leaves, snow, or ice cleared regularly.  Use sand or salt on walking paths during winter.  Clean up any spills in your garage right away. This includes oil or grease spills. What can I do in the bathroom?  Use night lights.  Install grab bars by the toilet and in the tub and shower. Do not use towel bars as grab bars.  Use non-skid mats or decals in the tub or shower.  If you  need to sit down in the shower, use a plastic, non-slip stool.  Keep the floor dry. Clean up any water that spills on the floor as soon as it happens.  Remove soap buildup in the tub or shower regularly.  Attach bath mats securely with double-sided non-slip rug tape.  Do not have throw rugs and other things on the floor that can make you trip. What can I do in the bedroom?  Use night lights.  Make sure that you have a light by your bed that is easy to reach.  Do not use any sheets or blankets that are too big for your bed. They should not hang down onto the floor.  Have a firm chair that has side arms. You can use this for support while you get dressed.  Do not have throw rugs and other things on the floor that can make you trip. What can I do in the kitchen?  Clean up any spills right away.  Avoid walking on wet floors.  Keep items that you use a lot in easy-to-reach places.  If you need to reach something above you, use a strong step stool that has a grab bar.  Keep electrical cords out of the way.  Do not use floor polish or wax that makes floors slippery. If you must use wax, use non-skid floor wax.  Do not have throw rugs and other things on the floor that can make you trip. What can I do with my stairs?  Do not leave any items on the stairs.  Make sure that there are handrails on both sides of the stairs and use them. Fix handrails that are broken or loose. Make sure that handrails are as long as the stairways.  Check any carpeting to make sure that it is firmly attached to the stairs. Fix any carpet that is loose or worn.  Avoid having throw rugs at the top or bottom of the stairs. If you do have throw rugs, attach them to the floor with carpet tape.  Make sure that you have a light switch at the top of the stairs and the bottom of the stairs. If you do not have them, ask someone to add them for you. What else can I do to help prevent falls?  Wear shoes  that:  Do not have high heels.  Have rubber bottoms.  Are comfortable and fit you well.  Are closed at the toe. Do not wear sandals.  If you use a stepladder:  Make sure that it is fully opened. Do not climb a closed stepladder.  Make sure that both sides of the stepladder are locked into place.  Ask someone to hold it for you, if possible.  Clearly mark and make sure that you can see:  Any grab bars or handrails.  First and last steps.  Where the edge of each step is.  Use tools that help you move around (mobility aids) if they are needed. These include:  Canes.  Walkers.  Scooters.  Crutches.  Turn on the lights when you go into a dark area. Replace any light bulbs as soon as they burn out.  Set up your furniture so you have a clear path. Avoid moving your furniture around.  If any of your floors are uneven, fix them.  If there are any pets around you, be aware of where they are.  Review your medicines with your doctor. Some medicines can make you feel dizzy. This can increase your chance of falling. Ask your doctor what other things that you can do to help prevent falls. This information is not intended to replace advice given to you by your health care provider. Make sure you discuss any questions you have with your health care provider. Document Released: 12/26/2008 Document Revised: 08/07/2015 Document Reviewed: 04/05/2014 Elsevier Interactive Patient Education  2017 [employer].

## 2019-10-12 NOTE — Progress Notes (Signed)
Subjective:   Brandi Cooper is a 74 y.o. female who presents for an Initial Medicare Annual Wellness Visit.  Review of Systems           Objective:    There were no vitals filed for this visit. There is no height or weight on file to calculate BMI.  No flowsheet data found.  Current Medications (verified) Outpatient Encounter Medications as of 10/12/2019  Medication Sig  . acetaZOLAMIDE (DIAMOX) 250 MG tablet   . Ascorbic Acid (VITAMIN C) 1000 MG tablet Take 1,000 mg by mouth daily.  Marland Kitchen aspirin 81 MG tablet Take 81 mg by mouth daily.  . Biotin 5000 MCG CAPS Take by mouth.  . Cholecalciferol (VITAMIN D3) 50 MCG (2000 UT) capsule Take by mouth.  . dorzolamide (TRUSOPT) 2 % ophthalmic solution Place into both eyes 3 (three) times daily.   Marland Kitchen glipiZIDE (GLUCOTROL) 10 MG tablet TAKE 2 TABLETS(20 MG) BY MOUTH TWICE DAILY BEFORE A MEAL  . GNP CINNAMON PO Take by mouth. Takes 2000mg  2 capfuls daily  . latanoprost (XALATAN) 0.005 % ophthalmic solution   . lisinopril (ZESTRIL) 20 MG tablet TAKE 1 TABLET(20 MG) BY MOUTH DAILY  . Omega-3 Fatty Acids (FISH OIL) 1200 MG CAPS Take 2 capsules by mouth.  omeprazole (PRILOSEC) 40 MG capsule Take 40 mg by mouth daily.  . pioglitazone (ACTOS) 45 MG tablet TAKE 1 TABLET(45 MG) BY MOUTH DAILY  . timolol (BETIMOL) 0.5 % ophthalmic solution Place 1 drop into both eyes 2 (two) times daily.  Marland Kitchen torsemide (DEMADEX) 20 MG tablet TAKE 1 TABLET(20 MG) BY MOUTH DAILY   No facility-administered encounter medications on file as of 10/12/2019.    Allergies (verified) Amitriptyline and Nsaids   History: Past Medical History:  Diagnosis Date  . Chronic kidney disease   . Diabetes mellitus without complication (HCC)   . GERD (gastroesophageal reflux disease)   . Glaucoma   . Heart disease    Past Surgical History:  Procedure Laterality Date  . ABDOMINAL HYSTERECTOMY    . blood transfusion    . BREAST SURGERY     Breast Reduction  . CERVICAL  FUSION    . EYE SURGERY    . KNEE CARTILAGE SURGERY    . LUMBAR DISC SURGERY    . RETINOPATHY SURGERY    . SHOULDER SURGERY Bilateral    Rotator Cuff  . TRABECULECTOMY    . TUBAL LIGATION    . VITRECTOMY AND CATARACT    . WRIST SURGERY     Carpal Tunnel   Family History  Problem Relation Age of Onset  . Diabetes Mother   . Hypertension Mother   . Stroke Mother   . Diabetes Father   . Arthritis Sister   . Cancer Sister   . Heart attack Sister   . Cancer Brother   . Hypertension Daughter   . Cancer Son   . Stroke Maternal Grandmother   . Hypertension Maternal Grandmother   . Diabetes Paternal Grandmother   . Heart attack Paternal Grandfather   . Cancer Sister   . Cancer Sister   . Stroke Sister   . Hypertension Sister   . Diabetes Brother   . Kidney disease Brother    Social History   Socioeconomic History  . Marital status: Married    Spouse name: Not on file  . Number of children: Not on file  . Years of education: Not on file  . Highest education level: Not on file  Occupational History  . Not on file  Tobacco Use  . Smoking status: Never Smoker  . Smokeless tobacco: Never Used  Vaping Use  . Vaping Use: Never used  Substance and Sexual Activity  . Alcohol use: Never  . Drug use: Never  . Sexual activity: Not on file  Other Topics Concern  . Not on file  Social History Narrative  . Not on file   Social Determinants of Health   Financial Resource Strain:   . Difficulty of Paying Living Expenses:   Food Insecurity:   . Worried About Programme researcher, broadcasting/film/video in the Last Year:   . Barista in the Last Year:   Transportation Needs:   . Freight forwarder (Medical):   Marland Kitchen Lack of Transportation (Non-Medical):   Physical Activity:   . Days of Exercise per Week:   . Minutes of Exercise per Session:   Stress:   . Feeling of Stress :   Social Connections:   . Frequency of Communication with Friends and Family:   . Frequency of Social Gatherings  with Friends and Family:   . Attends Religious Services:   . Active Member of Clubs or Organizations:   . Attends Banker Meetings:   Marland Kitchen Marital Status:     Tobacco Counseling Counseling given: Not Answered   Clinical Intake:                 Diabetic?Yes         Activities of Daily Living No flowsheet data found.  Patient Care Team: Ardith Dark, MD as PCP - General (Family Medicine)  Indicate any recent Medical Services you may have received from other than [facility] providers in the past year (date may be approximate).     Assessment:   This is a routine wellness examination for [name].  Hearing/Vision screen No exam data present  Dietary issues and exercise activities discussed:    Goals   None    Depression Screen PHQ 2/9 Scores 09/26/2018  PHQ - 2 Score 0    Fall Risk No flowsheet data found.  Any stairs in or around the home? No  If so, are there any without handrails? No  Home free of loose throw rugs in walkways, pet beds, electrical cords, etc? Yes  Adequate lighting in your home to reduce risk of falls? Yes   ASSISTIVE DEVICES UTILIZED TO PREVENT FALLS:  Life alert? No  Use of a cane, walker or w/c? Yes  Grab bars in the bathroom? No  Shower chair or bench in shower? Yes  Elevated toilet seat or a handicapped toilet? Yes   TIMED UP AND GO:  Was the test performed? Yes .  Length of time to ambulate 10 feet: 20 sec.   Gait slow and steady without use of assistive device  Cognitive Function:        Immunizations  There is no immunization history on file for this patient.  TDAP status: Due, Education has been provided regarding the importance of this vaccine. Advised may receive this vaccine at local pharmacy or Health Dept. Aware to provide a copy of the vaccination record if obtained from local pharmacy or Health Dept. Verbalized acceptance and understanding. Flu Vaccine status: Declined, Education has been  provided regarding the importance of this vaccine but patient still declined. Advised may receive this vaccine at local pharmacy or Health Dept. Aware to provide a copy of the vaccination record if obtained from local pharmacy  or Health Dept. Verbalized acceptance and understanding. Pneumococcal vaccine status: Declined,  Education has been provided regarding the importance of this vaccine but patient still declined. Advised may receive this vaccine at local pharmacy or Health Dept. Aware to provide a copy of the vaccination record if obtained from local pharmacy or Health Dept. Verbalized acceptance and understanding.  Covid-19 vaccine status: Completed vaccines  Qualifies for Shingles Vaccine? Yes   Zostavax completed No   Shingrix Completed?: No.    Education has been provided regarding the importance of this vaccine. Patient has been advised to call insurance company to determine out of pocket expense if they have not yet received this vaccine. Advised may also receive vaccine at local pharmacy or Health Dept. Verbalized acceptance and understanding.  Screening Tests Health Maintenance  Topic Date Due  . Hepatitis C Screening  Never done  . OPHTHALMOLOGY EXAM  Never done  . COVID-19 Vaccine (1) Never done  . MAMMOGRAM  Never done  . DEXA SCAN  Never done  . HEMOGLOBIN A1C  03/29/2019  . FOOT EXAM  09/26/2019  . TETANUS/TDAP  09/25/2028 (Originally 08/30/1964)  . PNA vac Low Risk Adult (1 of 2 - PCV13) 09/25/2028 (Originally 08/31/2011)  . INFLUENZA VACCINE  10/14/2019  . COLONOSCOPY  12/04/2025    Health Maintenance  Health Maintenance Due  Topic Date Due  . Hepatitis C Screening  Never done  . OPHTHALMOLOGY EXAM  Never done  . COVID-19 Vaccine (1) Never done  . MAMMOGRAM  Never done  . DEXA SCAN  Never done  . HEMOGLOBIN A1C  03/29/2019  . FOOT EXAM  09/26/2019    Colorectal cancer screening: Completed 12/05/15. Repeat every 10 years Mammogram status: Ordered 10/12/19. Pt  provided with contact info and advised to call to schedule appt.  Bone Density status: Ordered 10/12/19. Pt provided with contact info and advised to call to schedule appt.    Additional Screening:  Hepatitis C Screening: does qualify;   Vision Screening: Recommended annual ophthalmology exams for early detection of glaucoma and other disorders of the eye. Is the patient up to date with their annual eye exam?  Yes  Who is the provider or what is the name of the office in which the patient attends annual eye exams? Dr Dione Booze   Dental Screening: Recommended annual dental exams for proper oral hygiene  Community Resource Referral / Chronic Care Management: CRR required this visit?  No   CCM required this visit?  No      Plan:     I have personally reviewed and noted the following in the patient's chart:   . Medical and social history . Use of alcohol, tobacco or illicit drugs  . Current medications and supplements . Functional ability and status . Nutritional status . Physical activity . Advanced directives . List of other physicians . Hospitalizations, surgeries, and ER visits in previous 12 months . Vitals . Screenings to include cognitive, depression, and falls . Referrals and appointments  In addition, I have reviewed and discussed with patient certain preventive protocols, quality metrics, and best practice recommendations. A written personalized care plan for preventive services as well as general preventive health recommendations were provided to patient.     Marzella Schlein, LPN   9/37/1696   Nurse Notes: None

## 2019-10-12 NOTE — Progress Notes (Signed)
I have personally reviewed the Medicare Annual Wellness Visit and agree with the documentation.  This is a subsequent AWV.   Katina Degree. [name], MD 10/12/2019 4:00 PM

## 2019-10-12 NOTE — Addendum Note (Signed)
Addended by: Ardith Dark on: 10/12/2019 04:00 PM   Modules accepted: Level of Service

## 2019-10-24 ENCOUNTER — Encounter (HOSPITAL_COMMUNITY): Payer: Medicare Other

## 2019-10-24 ENCOUNTER — Other Ambulatory Visit: Payer: Self-pay

## 2019-10-24 ENCOUNTER — Encounter (HOSPITAL_COMMUNITY)
Admission: RE | Admit: 2019-10-24 | Discharge: 2019-10-24 | Disposition: A | Discharge status: Home / Self Care | Payer: Medicare Other | Source: Ambulatory Visit | Attending: Nephrology | Admitting: Nephrology

## 2019-10-24 VITALS — BP 104/49 | HR 62 | Temp 97.3°F | Resp 20

## 2019-10-24 DIAGNOSIS — N183 Chronic kidney disease, stage 3 unspecified: Secondary | ICD-10-CM | POA: Insufficient documentation

## 2019-10-24 LAB — POCT HEMOGLOBIN-HEMACUE: Hemoglobin: 10.9 g/dL — ABNORMAL LOW (ref 12.0–15.0)

## 2019-10-24 MED ORDER — EPOETIN ALFA-EPBX 10000 UNIT/ML IJ SOLN
10000.0000 [IU] | INTRAMUSCULAR | Status: DC
  Administered 2019-10-24: 10000 [IU] via SUBCUTANEOUS

## 2019-10-24 MED ORDER — EPOETIN ALFA-EPBX 10000 UNIT/ML IJ SOLN
INTRAMUSCULAR | Status: AC
  Filled 2019-10-24: qty 1

## 2019-10-26 ENCOUNTER — Other Ambulatory Visit: Payer: Self-pay | Admitting: Family Medicine

## 2019-10-26 DIAGNOSIS — Z0000 Encounter for general adult medical examination without abnormal findings: Secondary | ICD-10-CM

## 2019-10-26 DIAGNOSIS — Z1231 Encounter for screening mammogram for malignant neoplasm of breast: Secondary | ICD-10-CM

## 2019-10-26 DIAGNOSIS — E2839 Other primary ovarian failure: Secondary | ICD-10-CM

## 2019-11-07 ENCOUNTER — Encounter (HOSPITAL_COMMUNITY): Payer: Medicare Other

## 2019-11-14 ENCOUNTER — Other Ambulatory Visit: Payer: Self-pay

## 2019-11-14 ENCOUNTER — Encounter (HOSPITAL_COMMUNITY)
Admission: RE | Admit: 2019-11-14 | Discharge: 2019-11-14 | Disposition: A | Discharge status: Home / Self Care | Payer: Medicare Other | Source: Ambulatory Visit | Attending: Nephrology | Admitting: Nephrology

## 2019-11-14 ENCOUNTER — Telehealth: Payer: Self-pay | Admitting: Family Medicine

## 2019-11-14 VITALS — BP 138/53 | HR 65 | Temp 97.5°F | Resp 20

## 2019-11-14 DIAGNOSIS — D631 Anemia in chronic kidney disease: Secondary | ICD-10-CM | POA: Diagnosis not present

## 2019-11-14 DIAGNOSIS — N183 Chronic kidney disease, stage 3 unspecified: Secondary | ICD-10-CM | POA: Insufficient documentation

## 2019-11-14 LAB — IRON AND TIBC
Iron: 75 ug/dL (ref 28–170)
Saturation Ratios: 22 % (ref 10.4–31.8)
TIBC: 340 ug/dL (ref 250–450)
UIBC: 265 ug/dL

## 2019-11-14 LAB — FERRITIN: Ferritin: 379 ng/mL — ABNORMAL HIGH (ref 11–307)

## 2019-11-14 MED ORDER — EPOETIN ALFA-EPBX 10000 UNIT/ML IJ SOLN
10000.0000 [IU] | INTRAMUSCULAR | Status: DC
  Administered 2019-11-14: 10000 [IU] via SUBCUTANEOUS

## 2019-11-14 MED ORDER — EPOETIN ALFA-EPBX 10000 UNIT/ML IJ SOLN
INTRAMUSCULAR | Status: AC
  Filled 2019-11-14: qty 1

## 2019-11-14 NOTE — Telephone Encounter (Signed)
Patient is calling in requesting an appointment for her husband and herself. She is needing an appointment because she fell while she was out of town and now has bruising on her legs that are tender to the touch. Wanted to know if I could take the virtual and schedule patient in next Friday at 340?

## 2019-11-14 NOTE — Telephone Encounter (Signed)
Please schedule appointment.

## 2019-11-14 NOTE — Telephone Encounter (Signed)
Patient is scheduled   

## 2019-11-15 LAB — POCT HEMOGLOBIN-HEMACUE: Hemoglobin: 10.1 g/dL — ABNORMAL LOW (ref 12.0–15.0)

## 2019-11-23 ENCOUNTER — Other Ambulatory Visit: Payer: Self-pay

## 2019-11-23 ENCOUNTER — Ambulatory Visit (INDEPENDENT_AMBULATORY_CARE_PROVIDER_SITE_OTHER): Payer: Medicare Other | Admitting: Family Medicine

## 2019-11-23 VITALS — BP 120/70 | HR 77 | Temp 98.0°F | Ht 65.0 in | Wt 187.8 lb

## 2019-11-23 DIAGNOSIS — N183 Chronic kidney disease, stage 3 unspecified: Secondary | ICD-10-CM

## 2019-11-23 DIAGNOSIS — H409 Unspecified glaucoma: Secondary | ICD-10-CM | POA: Diagnosis not present

## 2019-11-23 NOTE — Assessment & Plan Note (Signed)
Stable. Will avoid prednisone for now.

## 2019-11-23 NOTE — Progress Notes (Signed)
   Brandi Cooper is a 74 y.o. female who presents today for an office visit.  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  Treatment options are limited at this point given history of CKD, glaucoma, and type 2 diabetes.  Recommended cold compresses.  Also recommended topical Voltaren.  Offered referral to sports medicine however she deferred for the time being.  Discussed reasons to return to care.  Chronic Problems Addressed Today: CKD (chronic kidney disease) stage 3, GFR 30-59 ml/min (HCC) Stable. Continue management per nephrology. Will avoid NSAIDs.   Glaucoma Stable. Will avoid prednisone for now.      Subjective:  HPI:  Patient with right-sided pain and more swelling.  She recently went on a cruise and suffered a mechanical fall.  She fell onto the right side of her body and has had pain and swelling since then.  Pain predominantly located in right knee, lower leg, and ankle.  Symptoms seem to be improving.       Objective:  Physical Exam: BP 120/70   Pulse 77   Temp 98 F (36.7 C) (Temporal)   Ht 5\' 5"  (1.651 m)   Wt 187 lb 12.8 oz (85.2 kg)   SpO2 98%   BMI 31.25 kg/m   Gen: No acute distress, resting comfortably MSK: Bilateral lower extremities with 1+ pitting edema.  Tender to palpation at ankle and knee.  Neurovascular intact distally.  Crepitus with active range of motion at knee. Psych: Normal affect and thought content      Brandi Cooper M. , MD 11/23/2019 4:57 PM

## 2019-11-23 NOTE — Patient Instructions (Signed)
It was very nice to see you today!  I think he had a flareup of arthritis.  Try using cold compresses and over-the-counter Voltaren gel.  Let me know if not improving.  Take care, Dr [name]  Please try these tips to maintain a healthy lifestyle:   Eat at least 3 REAL meals and 1-2 snacks per day.  Aim for no more than 5 hours between eating.  If you eat breakfast, please do so within one hour of getting up.    Each meal should contain half fruits/vegetables, one quarter protein, and one quarter carbs (no bigger than a computer mouse)   Cut down on sweet beverages. This includes juice, soda, and sweet tea.     Drink at least 1 glass of water with each meal and aim for at least 8 glasses per day   Exercise at least 150 minutes every week.

## 2019-11-23 NOTE — Assessment & Plan Note (Signed)
Stable. Continue management per nephrology. Will avoid NSAIDs.

## 2019-11-28 ENCOUNTER — Ambulatory Visit (HOSPITAL_COMMUNITY)
Admission: RE | Admit: 2019-11-28 | Discharge: 2019-11-28 | Disposition: A | Discharge status: Home / Self Care | Payer: Medicare Other | Source: Ambulatory Visit | Attending: Nephrology | Admitting: Nephrology

## 2019-11-28 ENCOUNTER — Other Ambulatory Visit: Payer: Self-pay

## 2019-11-28 VITALS — BP 93/49 | HR 55 | Temp 97.0°F | Resp 20

## 2019-11-28 DIAGNOSIS — N183 Chronic kidney disease, stage 3 unspecified: Secondary | ICD-10-CM | POA: Diagnosis present

## 2019-11-28 LAB — POCT HEMOGLOBIN-HEMACUE: Hemoglobin: 10.9 g/dL — ABNORMAL LOW (ref 12.0–15.0)

## 2019-11-28 MED ORDER — EPOETIN ALFA-EPBX 2000 UNIT/ML IJ SOLN
INTRAMUSCULAR | Status: AC
  Administered 2019-11-28: 2000 [IU] via SUBCUTANEOUS
  Filled 2019-11-28: qty 1

## 2019-11-28 MED ORDER — EPOETIN ALFA-EPBX 3000 UNIT/ML IJ SOLN
INTRAMUSCULAR | Status: AC
  Administered 2019-11-28: 3000 [IU] via SUBCUTANEOUS
  Filled 2019-11-28: qty 1

## 2019-11-28 MED ORDER — EPOETIN ALFA-EPBX 10000 UNIT/ML IJ SOLN: 15000.0000 [IU] | INTRAMUSCULAR | Status: DC

## 2019-11-28 MED ORDER — EPOETIN ALFA-EPBX 10000 UNIT/ML IJ SOLN
INTRAMUSCULAR | Status: AC
  Administered 2019-11-28: 10000 [IU] via SUBCUTANEOUS
  Filled 2019-11-28: qty 1

## 2019-11-30 ENCOUNTER — Other Ambulatory Visit: Payer: Self-pay | Admitting: Family Medicine

## 2019-12-24 ENCOUNTER — Other Ambulatory Visit: Payer: Self-pay | Admitting: Family Medicine

## 2019-12-26 ENCOUNTER — Ambulatory Visit (HOSPITAL_COMMUNITY)
Admission: RE | Admit: 2019-12-26 | Discharge: 2019-12-26 | Disposition: A | Discharge status: Home / Self Care | Payer: Medicare Other | Source: Ambulatory Visit | Attending: Nephrology | Admitting: Nephrology

## 2019-12-26 ENCOUNTER — Other Ambulatory Visit: Payer: Self-pay

## 2019-12-26 VITALS — BP 110/52 | HR 66 | Temp 97.4°F | Resp 20

## 2019-12-26 DIAGNOSIS — N183 Chronic kidney disease, stage 3 unspecified: Secondary | ICD-10-CM | POA: Diagnosis not present

## 2019-12-26 DIAGNOSIS — D631 Anemia in chronic kidney disease: Secondary | ICD-10-CM | POA: Diagnosis not present

## 2019-12-26 LAB — POCT HEMOGLOBIN-HEMACUE: Hemoglobin: 11.1 g/dL — ABNORMAL LOW (ref 12.0–15.0)

## 2019-12-26 LAB — IRON AND TIBC
Iron: 91 ug/dL (ref 28–170)
Saturation Ratios: 30 % (ref 10.4–31.8)
TIBC: 300 ug/dL (ref 250–450)
UIBC: 209 ug/dL

## 2019-12-26 LAB — FERRITIN: Ferritin: 403 ng/mL — ABNORMAL HIGH (ref 11–307)

## 2019-12-26 MED ORDER — EPOETIN ALFA-EPBX 2000 UNIT/ML IJ SOLN
INTRAMUSCULAR | Status: AC
  Filled 2019-12-26: qty 1

## 2019-12-26 MED ORDER — EPOETIN ALFA-EPBX 3000 UNIT/ML IJ SOLN
INTRAMUSCULAR | Status: AC
  Filled 2019-12-26: qty 1

## 2019-12-26 MED ORDER — EPOETIN ALFA-EPBX 10000 UNIT/ML IJ SOLN
INTRAMUSCULAR | Status: AC
  Filled 2019-12-26: qty 1

## 2019-12-26 MED ORDER — EPOETIN ALFA-EPBX 10000 UNIT/ML IJ SOLN
15000.0000 [IU] | INTRAMUSCULAR | Status: DC
  Administered 2019-12-26: 15000 [IU] via SUBCUTANEOUS

## 2020-01-23 ENCOUNTER — Other Ambulatory Visit: Payer: Self-pay

## 2020-01-23 ENCOUNTER — Encounter (HOSPITAL_COMMUNITY)
Admission: RE | Admit: 2020-01-23 | Discharge: 2020-01-23 | Disposition: A | Discharge status: Home / Self Care | Payer: Medicare Other | Source: Ambulatory Visit | Attending: Nephrology | Admitting: Nephrology

## 2020-01-23 ENCOUNTER — Encounter (HOSPITAL_COMMUNITY): Payer: Medicare Other

## 2020-01-23 VITALS — BP 114/70 | HR 72 | Temp 97.5°F | Resp 20

## 2020-01-23 DIAGNOSIS — D631 Anemia in chronic kidney disease: Secondary | ICD-10-CM | POA: Insufficient documentation

## 2020-01-23 DIAGNOSIS — N183 Chronic kidney disease, stage 3 unspecified: Secondary | ICD-10-CM

## 2020-01-23 LAB — IRON AND TIBC
Iron: 92 ug/dL (ref 28–170)
Saturation Ratios: 29 % (ref 10.4–31.8)
TIBC: 322 ug/dL (ref 250–450)
UIBC: 230 ug/dL

## 2020-01-23 LAB — POCT HEMOGLOBIN-HEMACUE: Hemoglobin: 11.6 g/dL — ABNORMAL LOW (ref 12.0–15.0)

## 2020-01-23 LAB — FERRITIN: Ferritin: 436 ng/mL — ABNORMAL HIGH (ref 11–307)

## 2020-01-23 MED ORDER — EPOETIN ALFA-EPBX 10000 UNIT/ML IJ SOLN
INTRAMUSCULAR | Status: AC
  Administered 2020-01-23: 10000 [IU] via SUBCUTANEOUS
  Filled 2020-01-23: qty 1

## 2020-01-23 MED ORDER — EPOETIN ALFA-EPBX 3000 UNIT/ML IJ SOLN
INTRAMUSCULAR | Status: AC
  Administered 2020-01-23: 3000 [IU] via SUBCUTANEOUS
  Filled 2020-01-23: qty 1

## 2020-01-23 MED ORDER — EPOETIN ALFA-EPBX 10000 UNIT/ML IJ SOLN: 15000.0000 [IU] | INTRAMUSCULAR | Status: DC

## 2020-01-23 MED ORDER — EPOETIN ALFA-EPBX 2000 UNIT/ML IJ SOLN
INTRAMUSCULAR | Status: AC
  Administered 2020-01-23: 2000 [IU] via SUBCUTANEOUS
  Filled 2020-01-23: qty 1

## 2020-01-24 ENCOUNTER — Encounter: Payer: Medicare Other | Admitting: Family Medicine

## 2020-01-28 ENCOUNTER — Other Ambulatory Visit: Payer: Self-pay

## 2020-01-28 ENCOUNTER — Encounter: Payer: Self-pay | Admitting: Family Medicine

## 2020-01-28 ENCOUNTER — Ambulatory Visit (INDEPENDENT_AMBULATORY_CARE_PROVIDER_SITE_OTHER): Payer: Medicare Other | Admitting: Family Medicine

## 2020-01-28 VITALS — BP 100/57 | HR 61 | Temp 97.4°F | Ht 65.0 in | Wt 187.0 lb

## 2020-01-28 DIAGNOSIS — E1122 Type 2 diabetes mellitus with diabetic chronic kidney disease: Secondary | ICD-10-CM | POA: Diagnosis not present

## 2020-01-28 DIAGNOSIS — E1159 Type 2 diabetes mellitus with other circulatory complications: Secondary | ICD-10-CM

## 2020-01-28 DIAGNOSIS — Z0001 Encounter for general adult medical examination with abnormal findings: Secondary | ICD-10-CM | POA: Diagnosis not present

## 2020-01-28 DIAGNOSIS — Z6831 Body mass index (BMI) 31.0-31.9, adult: Secondary | ICD-10-CM

## 2020-01-28 DIAGNOSIS — Z13220 Encounter for screening for lipoid disorders: Secondary | ICD-10-CM

## 2020-01-28 DIAGNOSIS — N183 Chronic kidney disease, stage 3 unspecified: Secondary | ICD-10-CM

## 2020-01-28 DIAGNOSIS — E669 Obesity, unspecified: Secondary | ICD-10-CM

## 2020-01-28 DIAGNOSIS — I152 Hypertension secondary to endocrine disorders: Secondary | ICD-10-CM

## 2020-01-28 LAB — POCT GLYCOSYLATED HEMOGLOBIN (HGB A1C): Hemoglobin A1C: 6.5 % — AB (ref 4.0–5.6)

## 2020-01-28 NOTE — Assessment & Plan Note (Signed)
At goal  Continue lisinopril 20mg daily

## 2020-01-28 NOTE — Assessment & Plan Note (Signed)
A1c 6.5.  Continue glipizide 20 mg twice daily.  Follow-up 6 months.

## 2020-01-28 NOTE — Assessment & Plan Note (Signed)
Follow with nephrology.  Will check CMET today.

## 2020-01-28 NOTE — Progress Notes (Signed)
Chief Complaint:  Brandi Cooper is a 74 y.o. female who presents today for her annual comprehensive physical exam.    Assessment/Plan:  Chronic Problems Addressed Today: Hypertension associated with diabetes (HCC) At goal.  Continue lisinopril 20 mg daily.  CKD (chronic kidney disease) stage 3, GFR 30-59 ml/min (HCC) Follow with nephrology.  Will check CMET today.  Type 2 diabetes mellitus with stage 3 chronic kidney disease, without long-term current use of insulin (HCC) A1c 6.5.  Continue glipizide 20 mg twice daily.  Follow-up 6 months.   Body mass index is 31.12 kg/m. / Obese  BMI Metric Follow Up - 01/28/20 1043      BMI Metric Follow Up-Please document annually   BMI Metric Follow Up Education provided            Preventative Healthcare: Check CBC, CMET, TSH, lipid panel.  Will be getting Covid booster beginning of next year.  Has upcoming mammogram and bone density scan.  Patient Counseling(The following topics were reviewed and/or handout was given):  -Nutrition: Stressed importance of moderation in sodium/caffeine intake, saturated fat and cholesterol, caloric balance, sufficient intake of fresh fruits, vegetables, and fiber.  -Stressed the importance of regular exercise.   -Substance Abuse: Discussed cessation/primary prevention of tobacco, alcohol, or other drug use; driving or other dangerous activities under the influence; availability of treatment for abuse.   -Injury prevention: Discussed safety belts, safety helmets, smoke detector, smoking near bedding or upholstery.   -Sexuality: Discussed sexually transmitted diseases, partner selection, use of condoms, avoidance of unintended pregnancy and contraceptive alternatives.   -Dental health: Discussed importance of regular tooth brushing, flossing, and dental visits.  -Health maintenance and immunizations reviewed. Please refer to Health maintenance section.  Return to care in 1 year for next preventative  visit.     Subjective:  HPI:  She has no acute complaints today.   Lifestyle Diet: Balanced.  Exercise: Limited.   Depression screen PHQ 2/9 10/12/2019  Decreased Interest 0  Down, Depressed, Hopeless 0  PHQ - 2 Score 0    Health Maintenance Due  Topic Date Due  . Hepatitis C Screening  Never done  . OPHTHALMOLOGY EXAM  Never done  . MAMMOGRAM  Never done  . DEXA SCAN  Never done  . FOOT EXAM  09/26/2019     ROS: Per HPI, otherwise a complete review of systems was negative.   PMH:  The following were reviewed and entered/updated in epic: Past Medical History:  Diagnosis Date  . Chronic kidney disease   . Diabetes mellitus without complication (HCC)   . GERD (gastroesophageal reflux disease)   . Glaucoma   . Heart disease    Patient Active Problem List   Diagnosis Date Noted  . Vertigo 09/26/2018  . Cluster headaches 09/26/2018  . Dry mouth 09/26/2018  . Type 2 diabetes mellitus with stage 3 chronic kidney disease, without long-term current use of insulin (HCC) 03/30/2018  . CKD (chronic kidney disease) stage 3, GFR 30-59 ml/min (HCC) 03/30/2018  . Hypertension associated with diabetes (HCC) 03/30/2018  . Glaucoma 03/30/2018  . Chronic pain of both shoulders 03/30/2018  . Allergic rhinitis 03/30/2018  . Anemia 03/30/2018   Past Surgical History:  Procedure Laterality Date  . ABDOMINAL HYSTERECTOMY    . blood transfusion    . BREAST SURGERY     Breast Reduction  . CERVICAL FUSION    . EYE SURGERY    . KNEE CARTILAGE SURGERY    . LUMBAR DISC SURGERY    .  RETINOPATHY SURGERY    . SHOULDER SURGERY Bilateral    Rotator Cuff  . TRABECULECTOMY    . TUBAL LIGATION    . VITRECTOMY AND CATARACT    . WRIST SURGERY     Carpal Tunnel    Family History  Problem Relation Age of Onset  . Diabetes Mother   . Hypertension Mother   . Stroke Mother   . Diabetes Father   . Arthritis Sister   . Cancer Sister   . Heart attack Sister   . Cancer Brother   .  Hypertension Daughter   . Cancer Son   . Stroke Maternal Grandmother   . Hypertension Maternal Grandmother   . Diabetes Paternal Grandmother   . Heart attack Paternal Grandfather   . Cancer Sister   . Cancer Sister   . Stroke Sister   . Hypertension Sister   . Diabetes Brother   . Kidney disease Brother     Medications- reviewed and updated Current Outpatient Medications  Medication Sig Dispense Refill  . acetaZOLAMIDE (DIAMOX) 250 MG tablet     . Ascorbic Acid (VITAMIN C) 1000 MG tablet Take 1,000 mg by mouth daily.    Marland Kitchen aspirin 81 MG tablet Take 81 mg by mouth daily.    . Biotin 5000 MCG CAPS Take by mouth.    . brimonidine (ALPHAGAN) 0.2 % ophthalmic solution 3 (three) times daily.    . Cholecalciferol (VITAMIN D3) 50 MCG (2000 UT) capsule Take by mouth.    . dorzolamide (TRUSOPT) 2 % ophthalmic solution Place into both eyes 3 (three) times daily.     Marland Kitchen glipiZIDE (GLUCOTROL) 10 MG tablet TAKE 2 TABLETS(20 MG) BY MOUTH TWICE DAILY BEFORE A MEAL 360 tablet 0  . GNP CINNAMON PO Take by mouth. Takes 2000mg  2 capfuls daily    . latanoprost (XALATAN) 0.005 % ophthalmic solution     . lisinopril (ZESTRIL) 20 MG tablet TAKE 1 TABLET(20 MG) BY MOUTH DAILY 90 tablet 0  . Omega-3 Fatty Acids (FISH OIL) 1200 MG CAPS Take 2 capsules by mouth.    omeprazole (PRILOSEC) 40 MG capsule Take 40 mg by mouth daily.    . sodium bicarbonate 650 MG tablet Take 1,300 mg by mouth 2 (two) times daily.    . timolol (BETIMOL) 0.5 % ophthalmic solution Place 1 drop into both eyes 2 (two) times daily.    Marland Kitchen torsemide (DEMADEX) 20 MG tablet TAKE 1 TABLET(20 MG) BY MOUTH DAILY 90 tablet 1   No current facility-administered medications for this visit.    Allergies-reviewed and updated Allergies  Allergen Reactions  . Amitriptyline   . Nsaids     Was told to avoid all NSAIDs due to kidney function    Social History   Socioeconomic History  . Marital status: Married    Spouse name: Not on file  .  Number of children: Not on file  . Years of education: Not on file  . Highest education level: Not on file  Occupational History  . Occupation: retired  Tobacco Use  . Smoking status: Never Smoker  . Smokeless tobacco: Never Used  Vaping Use  . Vaping Use: Never used  Substance and Sexual Activity  . Alcohol use: Never  . Drug use: Never  . Sexual activity: Not on file  Other Topics Concern  . Not on file  Social History Narrative  . Not on file   Social Determinants of Health   Financial Resource Strain: Low Risk   .  Difficulty of Paying Living Expenses: Not hard at all  Food Insecurity: No Food Insecurity  . Worried About Programme researcher, broadcasting/film/video in the Last Year: Never true  . Ran Out of Food in the Last Year: Never true  Transportation Needs: No Transportation Needs  . Lack of Transportation (Medical): No  . Lack of Transportation (Non-Medical): No  Physical Activity: Inactive  . Days of Exercise per Week: 0 days  . Minutes of Exercise per Session: 0 min  Stress: No Stress Concern Present  . Feeling of Stress : Not at all  Social Connections: Moderately Integrated  . Frequency of Communication with Friends and Family: Twice a week  . Frequency of Social Gatherings with Friends and Family: Once a week  . Attends Religious Services: More than 4 times per year  . Active Member of Clubs or Organizations: No  . Attends Banker Meetings: Never  . Marital Status: Married        Objective:  Physical Exam: BP (!) 100/57   Pulse 61   Temp (!) 97.4 F (36.3 C) (Temporal)   Ht 5\' 5"  (1.651 m)   Wt 187 lb (84.8 kg)   SpO2 100%   BMI 31.12 kg/m   Body mass index is 31.12 kg/m. Wt Readings from Last 3 Encounters:  01/28/20 187 lb (84.8 kg)  11/23/19 187 lb 12.8 oz (85.2 kg)  10/12/19 188 lb 9.6 oz (85.5 kg)   Gen: NAD, resting comfortably HEENT: TMs normal bilaterally. OP clear. No thyromegaly noted.  CV: RRR with no murmurs appreciated Pulm: NWOB, CTAB  with no crackles, wheezes, or rhonchi GI: Normal bowel sounds present. Soft, Nontender, Nondistended. MSK: no edema, cyanosis, or clubbing noted Skin: warm, dry Neuro: CN2-12 grossly intact. Strength 5/5 in upper and lower extremities. Reflexes symmetric and intact bilaterally.  Psych: Normal affect and thought content     Makenzi Bannister M. 10/14/19, MD 01/28/2020 10:44 AM

## 2020-01-28 NOTE — Patient Instructions (Signed)
It was very nice to see you today!  We will check blood work today.  Please keep up the good work.  Your A1c is a 6.5.  Do not need to make any other changes today.  I will see you back in 6 months.  Please come back to see me sooner if needed.  Take care, Dr Jerline Pain  Please try these tips to maintain a healthy lifestyle:   Eat at least 3 REAL meals and 1-2 snacks per day.  Aim for no more than 5 hours between eating.  If you eat breakfast, please do so within one hour of getting up.    Each meal should contain half fruits/vegetables, one quarter protein, and one quarter carbs (no bigger than a computer mouse)   Cut down on sweet beverages. This includes juice, soda, and sweet tea.     Drink at least 1 glass of water with each meal and aim for at least 8 glasses per day   Exercise at least 150 minutes every week.    Preventive Care 73 Years and Older, Female Preventive care refers to lifestyle choices and visits with your health care provider that can promote health and wellness. This includes:  A yearly physical exam. This is also called an annual well check.  Regular dental and eye exams.  Immunizations.  Screening for certain conditions.  Healthy lifestyle choices, such as diet and exercise. What can I expect for my preventive care visit? Physical exam Your health care provider will check:  Height and weight. These may be used to calculate body mass index (BMI), which is a measurement that tells if you are at a healthy weight.  Heart rate and blood pressure.  Your skin for abnormal spots. Counseling Your health care provider may ask you questions about:  Alcohol, tobacco, and drug use.  Emotional well-being.  Home and relationship well-being.  Sexual activity.  Eating habits.  History of falls.  Memory and ability to understand (cognition).  Work and work Statistician.  Pregnancy and menstrual history. What immunizations do I need?  Influenza  (flu) vaccine  This is recommended every year. Tetanus, diphtheria, and pertussis (Tdap) vaccine  You may need a Td booster every 10 years. Varicella (chickenpox) vaccine  You may need this vaccine if you have not already been vaccinated. Zoster (shingles) vaccine  You may need this after age 38. Pneumococcal conjugate (PCV13) vaccine  One dose is recommended after age 46. Pneumococcal polysaccharide (PPSV23) vaccine  One dose is recommended after age 57. Measles, mumps, and rubella (MMR) vaccine  You may need at least one dose of MMR if you were born in 1957 or later. You may also need a second dose. Meningococcal conjugate (MenACWY) vaccine  You may need this if you have certain conditions. Hepatitis A vaccine  You may need this if you have certain conditions or if you travel or work in places where you may be exposed to hepatitis A. Hepatitis B vaccine  You may need this if you have certain conditions or if you travel or work in places where you may be exposed to hepatitis B. Haemophilus influenzae type b (Hib) vaccine  You may need this if you have certain conditions. You may receive vaccines as individual doses or as more than one vaccine together in one shot (combination vaccines). Talk with your health care provider about the risks and benefits of combination vaccines. What tests do I need? Blood tests  Lipid and cholesterol levels. These may be  checked every 5 years, or more frequently depending on your overall health.  Hepatitis C test.  Hepatitis B test. Screening  Lung cancer screening. You may have this screening every year starting at age 28 if you have a 30-pack-year history of smoking and currently smoke or have quit within the past 15 years.  Colorectal cancer screening. All adults should have this screening starting at age 34 and continuing until age 30. Your health care provider may recommend screening at age 17 if you are at increased risk. You will  have tests every 1-10 years, depending on your results and the type of screening test.  Diabetes screening. This is done by checking your blood sugar (glucose) after you have not eaten for a while (fasting). You may have this done every 1-3 years.  Mammogram. This may be done every 1-2 years. Talk with your health care provider about how often you should have regular mammograms.  BRCA-related cancer screening. This may be done if you have a family history of breast, ovarian, tubal, or peritoneal cancers. Other tests  Sexually transmitted disease (STD) testing.  Bone density scan. This is done to screen for osteoporosis. You may have this done starting at age 77. Follow these instructions at home: Eating and drinking  Eat a diet that includes fresh fruits and vegetables, whole grains, lean protein, and low-fat dairy products. Limit your intake of foods with high amounts of sugar, saturated fats, and salt.  Take vitamin and mineral supplements as recommended by your health care provider.  Do not drink alcohol if your health care provider tells you not to drink.  If you drink alcohol: ? Limit how much you have to 0-1 drink a day. ? Be aware of how much alcohol is in your drink. In the [place] one drink equals one 12 oz bottle of beer (355 mL), one 5 oz glass of wine (148 mL), or one 1 oz glass of hard liquor (44 mL). Lifestyle  Take daily care of your teeth and gums.  Stay active. Exercise for at least 30 minutes on 5 or more days each week.  Do not use any products that contain nicotine or tobacco, such as cigarettes, e-cigarettes, and chewing tobacco. If you need help quitting, ask your health care provider.  If you are sexually active, practice safe sex. Use a condom or other form of protection in order to prevent STIs (sexually transmitted infections).  Talk with your health care provider about taking a low-dose aspirin or statin. What's next?  Go to your health care provider  once a year for a well check visit.  Ask your health care provider how often you should have your eyes and teeth checked.  Stay up to date on all vaccines. This information is not intended to replace advice given to you by your health care provider. Make sure you discuss any questions you have with your health care provider. Document Revised: 02/23/2018 Document Reviewed: 02/23/2018 Elsevier Patient Education  2020 [employer].

## 2020-01-29 LAB — COMPREHENSIVE METABOLIC PANEL
AG Ratio: 1.5 (calc) (ref 1.0–2.5)
ALT: 12 U/L (ref 6–29)
AST: 14 U/L (ref 10–35)
Albumin: 4.1 g/dL (ref 3.6–5.1)
Alkaline phosphatase (APISO): 66 U/L (ref 37–153)
BUN/Creatinine Ratio: 28 (calc) — ABNORMAL HIGH (ref 6–22)
BUN: 48 mg/dL — ABNORMAL HIGH (ref 7–25)
CO2: 23 mmol/L (ref 20–32)
Calcium: 8.9 mg/dL (ref 8.6–10.4)
Chloride: 108 mmol/L (ref 98–110)
Creat: 1.74 mg/dL — ABNORMAL HIGH (ref 0.60–0.93)
Globulin: 2.8 g/dL (calc) (ref 1.9–3.7)
Glucose, Bld: 216 mg/dL — ABNORMAL HIGH (ref 65–99)
Potassium: 4.1 mmol/L (ref 3.5–5.3)
Sodium: 139 mmol/L (ref 135–146)
Total Bilirubin: 0.5 mg/dL (ref 0.2–1.2)
Total Protein: 6.9 g/dL (ref 6.1–8.1)

## 2020-01-29 LAB — LIPID PANEL
Cholesterol: 192 mg/dL (ref <200)
HDL: 54 mg/dL (ref >=50)
LDL Cholesterol (Calc): 109 mg/dL (calc) — ABNORMAL HIGH
Non-HDL Cholesterol (Calc): 138 mg/dL (calc) — ABNORMAL HIGH (ref <130)
Total CHOL/HDL Ratio: 3.6 (calc) (ref <5.0)
Triglycerides: 169 mg/dL — ABNORMAL HIGH (ref <150)

## 2020-01-29 LAB — CBC
HCT: 34.9 % — ABNORMAL LOW (ref 35.0–45.0)
Hemoglobin: 11.3 g/dL — ABNORMAL LOW (ref 11.7–15.5)
MCH: 30 pg (ref 27.0–33.0)
MCHC: 32.4 g/dL (ref 32.0–36.0)
MCV: 92.6 fL (ref 80.0–100.0)
MPV: 11.9 fL (ref 7.5–12.5)
Platelets: 179 10*3/uL (ref 140–400)
RBC: 3.77 10*6/uL — ABNORMAL LOW (ref 3.80–5.10)
RDW: 14 % (ref 11.0–15.0)
WBC: 5.9 10*3/uL (ref 3.8–10.8)

## 2020-01-29 LAB — TSH: TSH: 3.14 mIU/L (ref 0.40–4.50)

## 2020-01-29 NOTE — Progress Notes (Signed)
Please inform patient of the following:  Labs are all STABLE. Do not need to make any changes to her treatment plan at this time. Would like for her to keep up the good work and we will see her back in 6 months.  Brandi Cooper. [name], MD 01/29/2020 1:40 PM

## 2020-02-01 ENCOUNTER — Ambulatory Visit
Admission: RE | Admit: 2020-02-01 | Discharge: 2020-02-01 | Disposition: A | Discharge status: Home / Self Care | Payer: Medicare Other | Source: Ambulatory Visit | Attending: Family Medicine | Admitting: Family Medicine

## 2020-02-01 ENCOUNTER — Other Ambulatory Visit: Payer: Self-pay

## 2020-02-01 DIAGNOSIS — E2839 Other primary ovarian failure: Secondary | ICD-10-CM

## 2020-02-01 DIAGNOSIS — Z1231 Encounter for screening mammogram for malignant neoplasm of breast: Secondary | ICD-10-CM

## 2020-02-01 DIAGNOSIS — Z0000 Encounter for general adult medical examination without abnormal findings: Secondary | ICD-10-CM

## 2020-02-04 NOTE — Progress Notes (Signed)
Please inform patient of the following:  Bone density scan is NORMAL. We can recheck again in 2 years.  Brandi Cooper. [name], MD 02/04/2020 10:53 AM

## 2020-02-17 ENCOUNTER — Other Ambulatory Visit: Payer: Self-pay | Admitting: Family Medicine

## 2020-02-19 NOTE — Telephone Encounter (Signed)
LAST APPOINTMENT DATE: 01/28/2020   NEXT APPOINTMENT DATE: 07/28/2020    No in medication history

## 2020-02-20 ENCOUNTER — Ambulatory Visit (HOSPITAL_COMMUNITY)
Admission: RE | Admit: 2020-02-20 | Discharge: 2020-02-20 | Disposition: A | Discharge status: Home / Self Care | Payer: Medicare Other | Source: Ambulatory Visit | Attending: Nephrology | Admitting: Nephrology

## 2020-02-20 ENCOUNTER — Other Ambulatory Visit: Payer: Self-pay

## 2020-02-20 VITALS — BP 109/46 | HR 72 | Temp 97.8°F | Resp 20

## 2020-02-20 DIAGNOSIS — N183 Chronic kidney disease, stage 3 unspecified: Secondary | ICD-10-CM | POA: Insufficient documentation

## 2020-02-20 DIAGNOSIS — D631 Anemia in chronic kidney disease: Secondary | ICD-10-CM | POA: Insufficient documentation

## 2020-02-20 LAB — IRON AND TIBC
Iron: 106 ug/dL (ref 28–170)
Saturation Ratios: 34 % — ABNORMAL HIGH (ref 10.4–31.8)
TIBC: 314 ug/dL (ref 250–450)
UIBC: 208 ug/dL

## 2020-02-20 LAB — FERRITIN: Ferritin: 352 ng/mL — ABNORMAL HIGH (ref 11–307)

## 2020-02-20 LAB — POCT HEMOGLOBIN-HEMACUE: Hemoglobin: 11.1 g/dL — ABNORMAL LOW (ref 12.0–15.0)

## 2020-02-20 MED ORDER — EPOETIN ALFA-EPBX 2000 UNIT/ML IJ SOLN
INTRAMUSCULAR | Status: AC
  Filled 2020-02-20: qty 1

## 2020-02-20 MED ORDER — EPOETIN ALFA-EPBX 10000 UNIT/ML IJ SOLN
15000.0000 [IU] | INTRAMUSCULAR | Status: DC
  Administered 2020-02-20: 15000 [IU] via SUBCUTANEOUS

## 2020-02-20 MED ORDER — EPOETIN ALFA-EPBX 10000 UNIT/ML IJ SOLN
INTRAMUSCULAR | Status: AC
  Filled 2020-02-20: qty 1

## 2020-02-20 MED ORDER — EPOETIN ALFA-EPBX 3000 UNIT/ML IJ SOLN
INTRAMUSCULAR | Status: AC
  Filled 2020-02-20: qty 1

## 2020-03-02 ENCOUNTER — Other Ambulatory Visit: Payer: Self-pay | Admitting: Family Medicine

## 2020-03-19 ENCOUNTER — Ambulatory Visit (HOSPITAL_COMMUNITY)
Admission: RE | Admit: 2020-03-19 | Discharge: 2020-03-19 | Disposition: A | Discharge status: Home / Self Care | Payer: Medicare Other | Source: Ambulatory Visit | Attending: Nephrology | Admitting: Nephrology

## 2020-03-19 ENCOUNTER — Other Ambulatory Visit: Payer: Self-pay

## 2020-03-19 VITALS — BP 112/60 | HR 63 | Temp 97.1°F | Resp 20

## 2020-03-19 DIAGNOSIS — N183 Chronic kidney disease, stage 3 unspecified: Secondary | ICD-10-CM | POA: Diagnosis not present

## 2020-03-19 DIAGNOSIS — D631 Anemia in chronic kidney disease: Secondary | ICD-10-CM | POA: Diagnosis not present

## 2020-03-19 LAB — IRON AND TIBC
Iron: 59 ug/dL (ref 28–170)
Saturation Ratios: 19 % (ref 10.4–31.8)
TIBC: 304 ug/dL (ref 250–450)
UIBC: 245 ug/dL

## 2020-03-19 LAB — POCT HEMOGLOBIN-HEMACUE: Hemoglobin: 10.6 g/dL — ABNORMAL LOW (ref 12.0–15.0)

## 2020-03-19 LAB — FERRITIN: Ferritin: 382 ng/mL — ABNORMAL HIGH (ref 11–307)

## 2020-03-19 MED ORDER — EPOETIN ALFA-EPBX 2000 UNIT/ML IJ SOLN
INTRAMUSCULAR | Status: AC
  Administered 2020-03-19: 2000 [IU] via SUBCUTANEOUS
  Filled 2020-03-19: qty 1

## 2020-03-19 MED ORDER — EPOETIN ALFA-EPBX 10000 UNIT/ML IJ SOLN
INTRAMUSCULAR | Status: AC
  Administered 2020-03-19: 10000 [IU] via SUBCUTANEOUS
  Filled 2020-03-19: qty 1

## 2020-03-19 MED ORDER — EPOETIN ALFA-EPBX 3000 UNIT/ML IJ SOLN
INTRAMUSCULAR | Status: AC
  Administered 2020-03-19: 3000 [IU] via SUBCUTANEOUS
  Filled 2020-03-19: qty 1

## 2020-03-19 MED ORDER — EPOETIN ALFA-EPBX 10000 UNIT/ML IJ SOLN: 15000.0000 [IU] | INTRAMUSCULAR | Status: DC

## 2020-03-30 ENCOUNTER — Other Ambulatory Visit: Payer: Self-pay | Admitting: Family Medicine

## 2020-04-16 ENCOUNTER — Ambulatory Visit (HOSPITAL_COMMUNITY)
Admission: RE | Admit: 2020-04-16 | Discharge: 2020-04-16 | Disposition: A | Discharge status: Home / Self Care | Payer: Medicare Other | Source: Ambulatory Visit | Attending: Nephrology | Admitting: Nephrology

## 2020-04-16 ENCOUNTER — Other Ambulatory Visit (HOSPITAL_COMMUNITY): Payer: Self-pay | Admitting: *Deleted

## 2020-04-16 ENCOUNTER — Other Ambulatory Visit: Payer: Self-pay

## 2020-04-16 VITALS — BP 121/58 | HR 64 | Temp 97.6°F | Resp 18

## 2020-04-16 DIAGNOSIS — D631 Anemia in chronic kidney disease: Secondary | ICD-10-CM | POA: Insufficient documentation

## 2020-04-16 DIAGNOSIS — N183 Chronic kidney disease, stage 3 unspecified: Secondary | ICD-10-CM | POA: Insufficient documentation

## 2020-04-16 LAB — IRON AND TIBC
Iron: 75 ug/dL (ref 28–170)
Saturation Ratios: 24 % (ref 10.4–31.8)
TIBC: 316 ug/dL (ref 250–450)
UIBC: 241 ug/dL

## 2020-04-16 LAB — POCT HEMOGLOBIN-HEMACUE: Hemoglobin: 10.4 g/dL — ABNORMAL LOW (ref 12.0–15.0)

## 2020-04-16 LAB — FERRITIN: Ferritin: 355 ng/mL — ABNORMAL HIGH (ref 11–307)

## 2020-04-16 MED ORDER — EPOETIN ALFA-EPBX 10000 UNIT/ML IJ SOLN
INTRAMUSCULAR | Status: AC
  Filled 2020-04-16: qty 2

## 2020-04-16 MED ORDER — EPOETIN ALFA-EPBX 10000 UNIT/ML IJ SOLN
15000.0000 [IU] | INTRAMUSCULAR | Status: DC
  Administered 2020-04-16: 15000 [IU] via SUBCUTANEOUS

## 2020-04-24 ENCOUNTER — Encounter (HOSPITAL_COMMUNITY)
Admission: RE | Admit: 2020-04-24 | Discharge: 2020-04-24 | Disposition: A | Discharge status: Home / Self Care | Payer: Medicare Other | Source: Ambulatory Visit | Attending: Nephrology | Admitting: Nephrology

## 2020-04-24 ENCOUNTER — Other Ambulatory Visit: Payer: Self-pay

## 2020-04-24 DIAGNOSIS — N183 Chronic kidney disease, stage 3 unspecified: Secondary | ICD-10-CM | POA: Diagnosis present

## 2020-04-24 DIAGNOSIS — D631 Anemia in chronic kidney disease: Secondary | ICD-10-CM | POA: Diagnosis not present

## 2020-04-24 MED ORDER — SODIUM CHLORIDE 0.9 % IV SOLN
510.0000 mg | INTRAVENOUS | Status: DC
  Administered 2020-04-24: 510 mg via INTRAVENOUS
  Filled 2020-04-24: qty 510

## 2020-05-01 ENCOUNTER — Other Ambulatory Visit: Payer: Self-pay

## 2020-05-01 ENCOUNTER — Encounter (HOSPITAL_COMMUNITY)
Admission: RE | Admit: 2020-05-01 | Discharge: 2020-05-01 | Disposition: A | Discharge status: Home / Self Care | Payer: Medicare Other | Source: Ambulatory Visit | Attending: Nephrology | Admitting: Nephrology

## 2020-05-01 DIAGNOSIS — N183 Chronic kidney disease, stage 3 unspecified: Secondary | ICD-10-CM | POA: Diagnosis not present

## 2020-05-01 MED ORDER — SODIUM CHLORIDE 0.9 % IV SOLN
510.0000 mg | INTRAVENOUS | Status: DC
  Administered 2020-05-01: 510 mg via INTRAVENOUS
  Filled 2020-05-01: qty 510

## 2020-05-14 ENCOUNTER — Other Ambulatory Visit: Payer: Self-pay

## 2020-05-14 ENCOUNTER — Ambulatory Visit (HOSPITAL_COMMUNITY)
Admission: RE | Admit: 2020-05-14 | Discharge: 2020-05-14 | Disposition: A | Discharge status: Home / Self Care | Payer: Medicare Other | Source: Ambulatory Visit | Attending: Nephrology | Admitting: Nephrology

## 2020-05-14 VITALS — BP 106/48 | HR 66 | Temp 96.8°F | Resp 20

## 2020-05-14 DIAGNOSIS — D631 Anemia in chronic kidney disease: Secondary | ICD-10-CM | POA: Diagnosis not present

## 2020-05-14 DIAGNOSIS — N183 Chronic kidney disease, stage 3 unspecified: Secondary | ICD-10-CM | POA: Insufficient documentation

## 2020-05-14 LAB — POCT HEMOGLOBIN-HEMACUE: Hemoglobin: 11.3 g/dL — ABNORMAL LOW (ref 12.0–15.0)

## 2020-05-14 LAB — IRON AND TIBC
Iron: 137 ug/dL (ref 28–170)
Saturation Ratios: 48 % — ABNORMAL HIGH (ref 10.4–31.8)
TIBC: 286 ug/dL (ref 250–450)
UIBC: 149 ug/dL

## 2020-05-14 LAB — FERRITIN: Ferritin: 723 ng/mL — ABNORMAL HIGH (ref 11–307)

## 2020-05-14 MED ORDER — EPOETIN ALFA-EPBX 10000 UNIT/ML IJ SOLN
INTRAMUSCULAR | Status: AC
  Administered 2020-05-14: 15000 [IU] via SUBCUTANEOUS
  Filled 2020-05-14: qty 2

## 2020-05-14 MED ORDER — EPOETIN ALFA-EPBX 10000 UNIT/ML IJ SOLN: 15000.0000 [IU] | INTRAMUSCULAR | Status: DC

## 2020-06-01 ENCOUNTER — Other Ambulatory Visit: Payer: Self-pay | Admitting: Family Medicine

## 2020-06-11 ENCOUNTER — Other Ambulatory Visit: Payer: Self-pay

## 2020-06-11 ENCOUNTER — Ambulatory Visit (HOSPITAL_COMMUNITY)
Admission: RE | Admit: 2020-06-11 | Discharge: 2020-06-11 | Disposition: A | Discharge status: Home / Self Care | Payer: Medicare Other | Source: Ambulatory Visit | Attending: Nephrology | Admitting: Nephrology

## 2020-06-11 VITALS — BP 113/55 | HR 65 | Resp 20

## 2020-06-11 DIAGNOSIS — N183 Chronic kidney disease, stage 3 unspecified: Secondary | ICD-10-CM | POA: Diagnosis present

## 2020-06-11 LAB — POCT HEMOGLOBIN-HEMACUE: Hemoglobin: 11 g/dL — ABNORMAL LOW (ref 12.0–15.0)

## 2020-06-11 MED ORDER — EPOETIN ALFA-EPBX 10000 UNIT/ML IJ SOLN
INTRAMUSCULAR | Status: AC
  Filled 2020-06-11: qty 2

## 2020-06-11 MED ORDER — EPOETIN ALFA-EPBX 10000 UNIT/ML IJ SOLN
15000.0000 [IU] | INTRAMUSCULAR | Status: DC
  Administered 2020-06-11: 15000 [IU] via SUBCUTANEOUS

## 2020-06-25 ENCOUNTER — Other Ambulatory Visit: Payer: Self-pay | Admitting: Family Medicine

## 2020-07-01 ENCOUNTER — Telehealth: Payer: Self-pay

## 2020-07-01 NOTE — Telephone Encounter (Signed)
Debra from [facility] cone denials team called regarding patient she states patient gets monthly Injections of (retacrit) or Feraheme most recent was 06/11/20 and she states [employer] is denying coverage for all injections due to needing prior auth from PCP office  If we have any questions debra said we can call her and leave voicemail if needed ( confidential voicemail) [phone]   Please advise.

## 2020-07-02 NOTE — Telephone Encounter (Signed)
Spoke with Brandi Cooper for more information on injections

## 2020-07-09 ENCOUNTER — Ambulatory Visit (HOSPITAL_COMMUNITY)
Admission: RE | Admit: 2020-07-09 | Discharge: 2020-07-09 | Disposition: A | Discharge status: Home / Self Care | Payer: Medicare Other | Source: Ambulatory Visit | Attending: Nephrology | Admitting: Nephrology

## 2020-07-09 ENCOUNTER — Other Ambulatory Visit: Payer: Self-pay

## 2020-07-09 VITALS — BP 124/62 | HR 64 | Temp 97.4°F | Resp 20

## 2020-07-09 DIAGNOSIS — D631 Anemia in chronic kidney disease: Secondary | ICD-10-CM | POA: Diagnosis not present

## 2020-07-09 DIAGNOSIS — N183 Chronic kidney disease, stage 3 unspecified: Secondary | ICD-10-CM | POA: Diagnosis present

## 2020-07-09 LAB — IRON AND TIBC
Iron: 98 ug/dL (ref 28–170)
Saturation Ratios: 33 % — ABNORMAL HIGH (ref 10.4–31.8)
TIBC: 301 ug/dL (ref 250–450)
UIBC: 203 ug/dL

## 2020-07-09 LAB — POCT HEMOGLOBIN-HEMACUE: Hemoglobin: 10.9 g/dL — ABNORMAL LOW (ref 12.0–15.0)

## 2020-07-09 LAB — FERRITIN: Ferritin: 712 ng/mL — ABNORMAL HIGH (ref 11–307)

## 2020-07-09 MED ORDER — EPOETIN ALFA-EPBX 10000 UNIT/ML IJ SOLN
INTRAMUSCULAR | Status: AC
  Filled 2020-07-09: qty 2

## 2020-07-09 MED ORDER — EPOETIN ALFA-EPBX 10000 UNIT/ML IJ SOLN
15000.0000 [IU] | INTRAMUSCULAR | Status: DC
  Administered 2020-07-09: 15000 [IU] via SUBCUTANEOUS

## 2020-07-13 ENCOUNTER — Other Ambulatory Visit: Payer: Self-pay | Admitting: Family Medicine

## 2020-07-23 LAB — HM DIABETES EYE EXAM

## 2020-07-28 ENCOUNTER — Other Ambulatory Visit: Payer: Self-pay

## 2020-07-28 ENCOUNTER — Encounter: Payer: Self-pay | Admitting: Family Medicine

## 2020-07-28 ENCOUNTER — Ambulatory Visit (INDEPENDENT_AMBULATORY_CARE_PROVIDER_SITE_OTHER): Payer: Medicare Other | Admitting: Family Medicine

## 2020-07-28 VITALS — BP 112/67 | HR 65 | Temp 97.9°F | Ht 65.0 in | Wt 188.6 lb

## 2020-07-28 DIAGNOSIS — I152 Hypertension secondary to endocrine disorders: Secondary | ICD-10-CM

## 2020-07-28 DIAGNOSIS — N183 Chronic kidney disease, stage 3 unspecified: Secondary | ICD-10-CM | POA: Diagnosis not present

## 2020-07-28 DIAGNOSIS — H409 Unspecified glaucoma: Secondary | ICD-10-CM

## 2020-07-28 DIAGNOSIS — E1159 Type 2 diabetes mellitus with other circulatory complications: Secondary | ICD-10-CM

## 2020-07-28 DIAGNOSIS — E1122 Type 2 diabetes mellitus with diabetic chronic kidney disease: Secondary | ICD-10-CM | POA: Diagnosis not present

## 2020-07-28 LAB — POCT GLYCOSYLATED HEMOGLOBIN (HGB A1C): Hemoglobin A1C: 6.5 % — AB (ref 4.0–5.6)

## 2020-07-28 NOTE — Patient Instructions (Signed)
It was very nice to see you today!  Your A1c looks good.  No medication changes today.  I will see back in 6 months for your annual physical.  Come back to see me sooner if needed.  Take care, Dr [name]  PLEASE NOTE:  If you had any lab tests please let us know if you have not heard back within a few days. You may see your results on mychart before we have a chance to review them but we will give you a call once they are reviewed by [place]. If we ordered any referrals today, please let us know if you have not heard from their office within the next week.   Please try these tips to maintain a healthy lifestyle:   Eat at least 3 REAL meals and 1-2 snacks per day.  Aim for no more than 5 hours between eating.  If you eat breakfast, please do so within one hour of getting up.    Each meal should contain half fruits/vegetables, one quarter protein, and one quarter carbs (no bigger than a computer mouse)   Cut down on sweet beverages. This includes juice, soda, and sweet tea.     Drink at least 1 glass of water with each meal and aim for at least 8 glasses per day   Exercise at least 150 minutes every week.

## 2020-07-28 NOTE — Assessment & Plan Note (Signed)
At goal lisinopril 20 mg daily.

## 2020-07-28 NOTE — Progress Notes (Signed)
   Brandi Cooper is a 75 y.o. female who presents today for an office visit.  Assessment/Plan:  Chronic Problems Addressed Today: Glaucoma Follows with ophthalmology.  Hypertension associated with diabetes ([facility]) At goal lisinopril 20 mg daily.  CKD (chronic kidney disease) stage 3, GFR 30-59 ml/min (HCC) Following with nephrology.  Check c-Met net blood draw.   Type 2 diabetes mellitus with stage 3 chronic kidney disease, without long-term current use of insulin (HCC) A1c stable at 6.5.  Continue glipizide 20 mg twice daily and Actos 84m daily.  Check A1c again in 6 months.    Subjective:  HPI:  See a/p         Objective:  Physical Exam: BP 112/67   Pulse 65   Temp 97.9 F (36.6 C) (Temporal)   Ht _0  (1.651 m)   Wt 188 lb 9.6 oz (85.5 kg)   SpO2 98%   BMI 31.38 kg/m   Gen: No acute distress, resting comfortablylar rate and rhythm with no murmurs appreciated Pulm: Normal work of breathing, clear to auscultation bilaterally with no crackles, wheezes, or rhonchi Neuro: Grossly normal, moves all extremities Psych: Normal affect and thought content      Brandi Cooper M. PJerline Pain MD 07/28/2020 11:28 AM

## 2020-07-28 NOTE — Assessment & Plan Note (Signed)
Follows with ophthalmology

## 2020-07-28 NOTE — Assessment & Plan Note (Signed)
Following with nephrology.  Check c-Met net blood draw.

## 2020-07-28 NOTE — Assessment & Plan Note (Signed)
A1c stable at 6.5.  Continue glipizide 20 mg twice daily and Actos 45mg  daily.  Check A1c again in 6 months.

## 2020-08-06 ENCOUNTER — Ambulatory Visit (HOSPITAL_COMMUNITY)
Admission: RE | Admit: 2020-08-06 | Discharge: 2020-08-06 | Disposition: A | Discharge status: Home / Self Care | Payer: Medicare Other | Source: Ambulatory Visit | Attending: Nephrology | Admitting: Nephrology

## 2020-08-06 ENCOUNTER — Other Ambulatory Visit: Payer: Self-pay

## 2020-08-06 VITALS — BP 106/64 | HR 58 | Temp 97.0°F | Resp 20

## 2020-08-06 DIAGNOSIS — N183 Chronic kidney disease, stage 3 unspecified: Secondary | ICD-10-CM | POA: Insufficient documentation

## 2020-08-06 DIAGNOSIS — D631 Anemia in chronic kidney disease: Secondary | ICD-10-CM | POA: Insufficient documentation

## 2020-08-06 LAB — FERRITIN: Ferritin: 528 ng/mL — ABNORMAL HIGH (ref 11–307)

## 2020-08-06 LAB — IRON AND TIBC
Iron: 93 ug/dL (ref 28–170)
Saturation Ratios: 31 % (ref 10.4–31.8)
TIBC: 298 ug/dL (ref 250–450)
UIBC: 205 ug/dL

## 2020-08-06 LAB — POCT HEMOGLOBIN-HEMACUE: Hemoglobin: 11.1 g/dL — ABNORMAL LOW (ref 12.0–15.0)

## 2020-08-06 MED ORDER — EPOETIN ALFA-EPBX 10000 UNIT/ML IJ SOLN
INTRAMUSCULAR | Status: AC
  Filled 2020-08-06: qty 2

## 2020-08-06 MED ORDER — EPOETIN ALFA-EPBX 10000 UNIT/ML IJ SOLN
15000.0000 [IU] | INTRAMUSCULAR | Status: DC
  Administered 2020-08-06: 15000 [IU] via SUBCUTANEOUS

## 2020-08-27 ENCOUNTER — Encounter: Payer: Self-pay | Admitting: Family Medicine

## 2020-08-30 ENCOUNTER — Other Ambulatory Visit: Payer: Self-pay | Admitting: Family Medicine

## 2020-09-03 ENCOUNTER — Other Ambulatory Visit: Payer: Self-pay

## 2020-09-03 ENCOUNTER — Ambulatory Visit (HOSPITAL_COMMUNITY)
Admission: RE | Admit: 2020-09-03 | Discharge: 2020-09-03 | Disposition: A | Discharge status: Home / Self Care | Payer: Medicare Other | Source: Ambulatory Visit | Attending: Nephrology | Admitting: Nephrology

## 2020-09-03 VITALS — BP 106/50 | HR 61 | Temp 97.2°F | Resp 20

## 2020-09-03 DIAGNOSIS — D631 Anemia in chronic kidney disease: Secondary | ICD-10-CM | POA: Insufficient documentation

## 2020-09-03 DIAGNOSIS — N183 Chronic kidney disease, stage 3 unspecified: Secondary | ICD-10-CM | POA: Diagnosis present

## 2020-09-03 LAB — IRON AND TIBC
Iron: 72 ug/dL (ref 28–170)
Saturation Ratios: 23 % (ref 10.4–31.8)
TIBC: 315 ug/dL (ref 250–450)
UIBC: 243 ug/dL

## 2020-09-03 LAB — FERRITIN: Ferritin: 560 ng/mL — ABNORMAL HIGH (ref 11–307)

## 2020-09-03 LAB — POCT HEMOGLOBIN-HEMACUE: Hemoglobin: 10.8 g/dL — ABNORMAL LOW (ref 12.0–15.0)

## 2020-09-03 MED ORDER — EPOETIN ALFA-EPBX 10000 UNIT/ML IJ SOLN
INTRAMUSCULAR | Status: AC
  Filled 2020-09-03: qty 2

## 2020-09-03 MED ORDER — EPOETIN ALFA-EPBX 10000 UNIT/ML IJ SOLN
15000.0000 [IU] | INTRAMUSCULAR | Status: DC
  Administered 2020-09-03: 15000 [IU] via SUBCUTANEOUS

## 2020-10-01 ENCOUNTER — Ambulatory Visit (HOSPITAL_COMMUNITY)
Admission: RE | Admit: 2020-10-01 | Discharge: 2020-10-01 | Disposition: A | Discharge status: Home / Self Care | Payer: Medicare Other | Source: Ambulatory Visit | Attending: Nephrology | Admitting: Nephrology

## 2020-10-01 ENCOUNTER — Encounter (HOSPITAL_COMMUNITY): Payer: Medicare Other

## 2020-10-01 VITALS — BP 116/55 | HR 60 | Temp 97.1°F | Resp 20

## 2020-10-01 DIAGNOSIS — N183 Chronic kidney disease, stage 3 unspecified: Secondary | ICD-10-CM | POA: Insufficient documentation

## 2020-10-01 DIAGNOSIS — D631 Anemia in chronic kidney disease: Secondary | ICD-10-CM | POA: Insufficient documentation

## 2020-10-01 LAB — IRON AND TIBC
Iron: 105 ug/dL (ref 28–170)
Saturation Ratios: 38 % — ABNORMAL HIGH (ref 10.4–31.8)
TIBC: 273 ug/dL (ref 250–450)
UIBC: 168 ug/dL

## 2020-10-01 LAB — POCT HEMOGLOBIN-HEMACUE: Hemoglobin: 12.2 g/dL (ref 12.0–15.0)

## 2020-10-01 LAB — FERRITIN: Ferritin: 590 ng/mL — ABNORMAL HIGH (ref 11–307)

## 2020-10-01 MED ORDER — EPOETIN ALFA-EPBX 10000 UNIT/ML IJ SOLN: 15000.0000 [IU] | INTRAMUSCULAR | Status: DC

## 2020-10-03 LAB — COMPREHENSIVE METABOLIC PANEL
Albumin: 4.2 (ref 3.5–5.0)
Albumin: 4.2 (ref 3.5–5.0)
Calcium: 8.8 (ref 8.7–10.7)
Calcium: 8.8 (ref 8.7–10.7)
GFR calc Af Amer: 32
GFR calc non Af Amer: 32

## 2020-10-03 LAB — BASIC METABOLIC PANEL
BUN: 50 — AB (ref 4–21)
BUN: 50 — AB (ref 4–21)
CO2: 21 (ref 13–22)
CO2: 21 (ref 13–22)
Chloride: 110 — AB (ref 99–108)
Chloride: 110 — AB (ref 99–108)
Creatinine: 1.7 — AB (ref 0.5–1.1)
Creatinine: 1.7 — AB (ref 0.5–1.1)
Glucose: 169
Glucose: 169
Potassium: 4.3 (ref 3.4–5.3)
Potassium: 4.3 (ref 3.4–5.3)
Sodium: 140 (ref 137–147)
Sodium: 140 (ref 137–147)

## 2020-10-03 LAB — VITAMIN D 25 HYDROXY (VIT D DEFICIENCY, FRACTURES): Vit D, 25-Hydroxy: 42.5

## 2020-10-05 ENCOUNTER — Other Ambulatory Visit: Payer: Self-pay | Admitting: Family Medicine

## 2020-10-15 ENCOUNTER — Encounter (HOSPITAL_COMMUNITY)
Admission: RE | Admit: 2020-10-15 | Discharge: 2020-10-15 | Disposition: A | Discharge status: Home / Self Care | Payer: Medicare Other | Source: Ambulatory Visit | Attending: Nephrology | Admitting: Nephrology

## 2020-10-15 ENCOUNTER — Other Ambulatory Visit: Payer: Self-pay

## 2020-10-15 VITALS — BP 114/59 | HR 62 | Temp 97.7°F | Resp 20

## 2020-10-15 DIAGNOSIS — D631 Anemia in chronic kidney disease: Secondary | ICD-10-CM | POA: Diagnosis not present

## 2020-10-15 DIAGNOSIS — N183 Chronic kidney disease, stage 3 unspecified: Secondary | ICD-10-CM | POA: Diagnosis not present

## 2020-10-15 LAB — POCT HEMOGLOBIN-HEMACUE: Hemoglobin: 10.8 g/dL — ABNORMAL LOW (ref 12.0–15.0)

## 2020-10-15 MED ORDER — EPOETIN ALFA-EPBX 10000 UNIT/ML IJ SOLN
15000.0000 [IU] | INTRAMUSCULAR | Status: DC
  Administered 2020-10-15: 15000 [IU] via SUBCUTANEOUS

## 2020-10-15 MED ORDER — EPOETIN ALFA-EPBX 10000 UNIT/ML IJ SOLN
INTRAMUSCULAR | Status: AC
  Filled 2020-10-15: qty 2

## 2020-10-17 ENCOUNTER — Ambulatory Visit: Payer: Medicare Other

## 2020-10-23 ENCOUNTER — Encounter: Payer: Self-pay | Admitting: Family Medicine

## 2020-10-29 ENCOUNTER — Encounter (HOSPITAL_COMMUNITY): Payer: Medicare Other

## 2020-10-29 ENCOUNTER — Encounter: Payer: Self-pay | Admitting: Family Medicine

## 2020-11-04 ENCOUNTER — Encounter: Payer: Self-pay | Admitting: Gastroenterology

## 2020-11-12 ENCOUNTER — Other Ambulatory Visit: Payer: Self-pay

## 2020-11-12 ENCOUNTER — Encounter (HOSPITAL_COMMUNITY)
Admission: RE | Admit: 2020-11-12 | Discharge: 2020-11-12 | Disposition: A | Discharge status: Home / Self Care | Payer: Medicare Other | Source: Ambulatory Visit | Attending: Nephrology | Admitting: Nephrology

## 2020-11-12 VITALS — BP 105/51 | HR 69 | Temp 97.7°F | Resp 17

## 2020-11-12 DIAGNOSIS — N183 Chronic kidney disease, stage 3 unspecified: Secondary | ICD-10-CM

## 2020-11-12 LAB — IRON AND TIBC
Iron: 89 ug/dL (ref 28–170)
Saturation Ratios: 30 % (ref 10.4–31.8)
TIBC: 298 ug/dL (ref 250–450)
UIBC: 209 ug/dL

## 2020-11-12 LAB — POCT HEMOGLOBIN-HEMACUE: Hemoglobin: 11.1 g/dL — ABNORMAL LOW (ref 12.0–15.0)

## 2020-11-12 LAB — FERRITIN: Ferritin: 457 ng/mL — ABNORMAL HIGH (ref 11–307)

## 2020-11-12 MED ORDER — EPOETIN ALFA-EPBX 10000 UNIT/ML IJ SOLN
15000.0000 [IU] | INTRAMUSCULAR | Status: DC
  Administered 2020-11-12: 15000 [IU] via SUBCUTANEOUS

## 2020-11-12 MED ORDER — EPOETIN ALFA-EPBX 10000 UNIT/ML IJ SOLN
INTRAMUSCULAR | Status: AC
  Filled 2020-11-12: qty 2

## 2020-11-27 ENCOUNTER — Other Ambulatory Visit: Payer: Self-pay

## 2020-11-27 ENCOUNTER — Ambulatory Visit (INDEPENDENT_AMBULATORY_CARE_PROVIDER_SITE_OTHER): Payer: Medicare Other

## 2020-11-27 VITALS — BP 112/62 | HR 75 | Temp 98.0°F | Wt 188.8 lb

## 2020-11-27 DIAGNOSIS — Z0000 Encounter for general adult medical examination without abnormal findings: Secondary | ICD-10-CM

## 2020-11-27 DIAGNOSIS — Z599 Problem related to housing and economic circumstances, unspecified: Secondary | ICD-10-CM | POA: Diagnosis not present

## 2020-11-27 NOTE — Patient Instructions (Signed)
Brandi Cooper , Thank you for taking time to come for your Medicare Wellness Visit. I appreciate your ongoing commitment to your health goals. Please review the following plan we discussed and let me know if I can assist you in the future.   Screening recommendations/referrals: Colonoscopy: Done 12/05/15 repeat every 10 years  Mammogram: Done 11/196/21 repeat every year Bone Density: Done 02/01/20 repeat every 2 years  Recommended yearly ophthalmology/optometry visit for glaucoma screening and checkup Recommended yearly dental visit for hygiene and checkup  Vaccinations: Influenza vaccine: Declined  Pneumococcal vaccine: Declined  Tdap vaccine: declined  Shingles vaccine: Shingrix discussed. Please contact your pharmacy for coverage information.    Covid-19:Completed 5/31 & 09/10/19  Advanced directives: Advance directive discussed with you today. I have provided a copy for you to complete at home and have notarized. Once this is complete please bring a copy in to our office so we can scan it into your chart.  Conditions/risks identified: none at this time   Next appointment: Follow up in one year for your annual wellness visit    Preventive Care 65 Years and Older, Female Preventive care refers to lifestyle choices and visits with your health care provider that can promote health and wellness. What does preventive care include? A yearly physical exam. This is also called an annual well check. Dental exams once or twice a year. Routine eye exams. Ask your health care provider how often you should have your eyes checked. Personal lifestyle choices, including: Daily care of your teeth and gums. Regular physical activity. Eating a healthy diet. Avoiding tobacco and drug use. Limiting alcohol use. Practicing safe sex. Taking low-dose aspirin every day. Taking vitamin and mineral supplements as recommended by your health care provider. What happens during an annual well check? The  services and screenings done by your health care provider during your annual well check will depend on your age, overall health, lifestyle risk factors, and family history of disease. Counseling  Your health care provider may ask you questions about your: Alcohol use. Tobacco use. Drug use. Emotional well-being. Home and relationship well-being. Sexual activity. Eating habits. History of falls. Memory and ability to understand (cognition). Work and work Astronomer. Reproductive health. Screening  You may have the following tests or measurements: Height, weight, and BMI. Blood pressure. Lipid and cholesterol levels. These may be checked every 5 years, or more frequently if you are over 22 years old. Skin check. Lung cancer screening. You may have this screening every year starting at age 74 if you have a 30-pack-year history of smoking and currently smoke or have quit within the past 15 years. Fecal occult blood test (FOBT) of the stool. You may have this test every year starting at age 44. Flexible sigmoidoscopy or colonoscopy. You may have a sigmoidoscopy every 5 years or a colonoscopy every 10 years starting at age 22. Hepatitis C blood test. Hepatitis B blood test. Sexually transmitted disease (STD) testing. Diabetes screening. This is done by checking your blood sugar (glucose) after you have not eaten for a while (fasting). You may have this done every 1-3 years. Bone density scan. This is done to screen for osteoporosis. You may have this done starting at age 64. Mammogram. This may be done every 1-2 years. Talk to your health care provider about how often you should have regular mammograms. Talk with your health care provider about your test results, treatment options, and if necessary, the need for more tests. Vaccines  Your health care provider  may recommend certain vaccines, such as: Influenza vaccine. This is recommended every year. Tetanus, diphtheria, and acellular  pertussis (Tdap, Td) vaccine. You may need a Td booster every 10 years. Zoster vaccine. You may need this after age 48. Pneumococcal 13-valent conjugate (PCV13) vaccine. One dose is recommended after age 16. Pneumococcal polysaccharide (PPSV23) vaccine. One dose is recommended after age 84. Talk to your health care provider about which screenings and vaccines you need and how often you need them. This information is not intended to replace advice given to you by your health care provider. Make sure you discuss any questions you have with your health care provider. Document Released: 03/28/2015 Document Revised: 11/19/2015 Document Reviewed: 12/31/2014 Elsevier Interactive Patient Education  2017 [place] Prevention in the Home Falls can cause injuries. They can happen to people of all ages. There are many things you can do to make your home safe and to help prevent falls. What can I do on the outside of my home? Regularly fix the edges of walkways and driveways and fix any cracks. Remove anything that might make you trip as you walk through a door, such as a raised step or threshold. Trim any bushes or trees on the path to your home. Use bright outdoor lighting. Clear any walking paths of anything that might make someone trip, such as rocks or tools. Regularly check to see if handrails are loose or broken. Make sure that both sides of any steps have handrails. Any raised decks and porches should have guardrails on the edges. Have any leaves, snow, or ice cleared regularly. Use sand or salt on walking paths during winter. Clean up any spills in your garage right away. This includes oil or grease spills. What can I do in the bathroom? Use night lights. Install grab bars by the toilet and in the tub and shower. Do not use towel bars as grab bars. Use non-skid mats or decals in the tub or shower. If you need to sit down in the shower, use a plastic, non-slip stool. Keep the floor  dry. Clean up any water that spills on the floor as soon as it happens. Remove soap buildup in the tub or shower regularly. Attach bath mats securely with double-sided non-slip rug tape. Do not have throw rugs and other things on the floor that can make you trip. What can I do in the bedroom? Use night lights. Make sure that you have a light by your bed that is easy to reach. Do not use any sheets or blankets that are too big for your bed. They should not hang down onto the floor. Have a firm chair that has side arms. You can use this for support while you get dressed. Do not have throw rugs and other things on the floor that can make you trip. What can I do in the kitchen? Clean up any spills right away. Avoid walking on wet floors. Keep items that you use a lot in easy-to-reach places. If you need to reach something above you, use a strong step stool that has a grab bar. Keep electrical cords out of the way. Do not use floor polish or wax that makes floors slippery. If you must use wax, use non-skid floor wax. Do not have throw rugs and other things on the floor that can make you trip. What can I do with my stairs? Do not leave any items on the stairs. Make sure that there are handrails on both sides  of the stairs and use them. Fix handrails that are broken or loose. Make sure that handrails are as long as the stairways. Check any carpeting to make sure that it is firmly attached to the stairs. Fix any carpet that is loose or worn. Avoid having throw rugs at the top or bottom of the stairs. If you do have throw rugs, attach them to the floor with carpet tape. Make sure that you have a light switch at the top of the stairs and the bottom of the stairs. If you do not have them, ask someone to add them for you. What else can I do to help prevent falls? Wear shoes that: Do not have high heels. Have rubber bottoms. Are comfortable and fit you well. Are closed at the toe. Do not wear  sandals. If you use a stepladder: Make sure that it is fully opened. Do not climb a closed stepladder. Make sure that both sides of the stepladder are locked into place. Ask someone to hold it for you, if possible. Clearly mark and make sure that you can see: Any grab bars or handrails. First and last steps. Where the edge of each step is. Use tools that help you move around (mobility aids) if they are needed. These include: Canes. Walkers. Scooters. Crutches. Turn on the lights when you go into a dark area. Replace any light bulbs as soon as they burn out. Set up your furniture so you have a clear path. Avoid moving your furniture around. If any of your floors are uneven, fix them. If there are any pets around you, be aware of where they are. Review your medicines with your doctor. Some medicines can make you feel dizzy. This can increase your chance of falling. Ask your doctor what other things that you can do to help prevent falls. This information is not intended to replace advice given to you by your health care provider. Make sure you discuss any questions you have with your health care provider. Document Released: 12/26/2008 Document Revised: 08/07/2015 Document Reviewed: 04/05/2014 Elsevier Interactive Patient Education  2017 [employer].

## 2020-11-27 NOTE — Progress Notes (Signed)
Subjective:   Brandi Cooper is a 75 y.o. female who presents for Medicare Annual (Subsequent) preventive examination.  Review of Systems     Cardiac Risk Factors include: advanced age (>74men, >33 women);hypertension;diabetes mellitus;obesity (BMI >30kg/m2)     Objective:    Today's Vitals   11/27/20 1306 11/27/20 1311  BP: 112/62   Pulse: 75   Temp: 98 F (36.7 C)   SpO2: 95%   Weight: 188 lb 12.8 oz (85.6 kg)   PainSc:  5    Body mass index is 31.42 kg/m.  Advanced Directives 11/27/2020 10/12/2019  Does Patient Have a Medical Advance Directive? No No  Would patient like information on creating a medical advance directive? Yes (MAU/Ambulatory/Procedural Areas - Information given) Yes (ED - Information included in AVS)    Current Medications (verified) Outpatient Encounter Medications as of 11/27/2020  Medication Sig   acetaZOLAMIDE (DIAMOX) 250 MG tablet    Ascorbic Acid (VITAMIN C) 1000 MG tablet Take 1,000 mg by mouth daily.   aspirin 81 MG tablet Take 81 mg by mouth daily.   Biotin 5000 MCG CAPS Take by mouth.   brimonidine (ALPHAGAN) 0.2 % ophthalmic solution 3 (three) times daily.   Cholecalciferol (VITAMIN D3) 50 MCG (2000 UT) capsule Take by mouth.   dorzolamide (TRUSOPT) 2 % ophthalmic solution Place into both eyes 3 (three) times daily.    glipiZIDE (GLUCOTROL) 10 MG tablet TAKE 2 TABLETS(20 MG) BY MOUTH TWICE DAILY BEFORE A MEAL   GNP CINNAMON PO Take by mouth. Takes 2000mg  2 capfuls daily   lisinopril (ZESTRIL) 20 MG tablet TAKE 1 TABLET(20 MG) BY MOUTH DAILY   Omega-3 Fatty Acids (FISH OIL) 1200 MG CAPS Take 2 capsules by mouth.   pioglitazone (ACTOS) 45 MG tablet TAKE 1 TABLET(45 MG) BY MOUTH DAILY   ROCKLATAN 0.02-0.005 % SOLN Apply 1 drop to eye at bedtime.   sodium bicarbonate 650 MG tablet Take 1,300 mg by mouth 2 (two) times daily.   timolol (BETIMOL) 0.5 % ophthalmic solution Place 1 drop into both eyes 2 (two) times daily.   torsemide (DEMADEX) 20  MG tablet TAKE 1 TABLET(20 MG) BY MOUTH DAILY (Patient taking differently: Twice a day)   omeprazole (PRILOSEC) 40 MG capsule Take 40 mg by mouth daily. (Patient not taking: Reported on 11/27/2020)   No facility-administered encounter medications on file as of 11/27/2020.    Allergies (verified) Amitriptyline and Nsaids   History: Past Medical History:  Diagnosis Date   Chronic kidney disease    Diabetes mellitus without complication (HCC)    GERD (gastroesophageal reflux disease)    Glaucoma    Heart disease    Past Surgical History:  Procedure Laterality Date   ABDOMINAL HYSTERECTOMY     blood transfusion     BREAST SURGERY     Breast Reduction   CERVICAL FUSION     EYE SURGERY     KNEE CARTILAGE SURGERY     LUMBAR DISC SURGERY     REDUCTION MAMMAPLASTY     RETINOPATHY SURGERY     SHOULDER SURGERY Bilateral    Rotator Cuff   TRABECULECTOMY     TUBAL LIGATION     VITRECTOMY AND CATARACT     WRIST SURGERY     Carpal Tunnel   Family History  Problem Relation Age of Onset   Diabetes Mother    Hypertension Mother    Stroke Mother    Diabetes Father    Arthritis Sister    Cancer  Sister    Heart attack Sister    Cancer Brother    Hypertension Daughter    Cancer Son    Stroke Maternal Grandmother    Hypertension Maternal Grandmother    Diabetes Paternal Grandmother    Heart attack Paternal Grandfather    Cancer Sister    Cancer Sister    Stroke Sister    Hypertension Sister    Diabetes Brother    Kidney disease Brother    Social History   Socioeconomic History   Marital status: Married    Spouse name: Not on file   Number of children: Not on file   Years of education: Not on file   Highest education level: Not on file  Occupational History   Occupation: retired  Tobacco Use   Smoking status: Never   Smokeless tobacco: Never  Vaping Use   Vaping Use: Never used  Substance and Sexual Activity   Alcohol use: Never   Drug use: Never   Sexual  activity: Not on file  Other Topics Concern   Not on file  Social History Narrative   Not on file   Social Determinants of Health   Financial Resource Strain: Medium Risk   Difficulty of Paying Living Expenses: Somewhat hard  Food Insecurity: Geophysicist/field seismologist Present   Worried About Programme researcher, broadcasting/film/video in the Last Year: Sometimes true   Barista in the Last Year: Sometimes true  Transportation Needs: Unmet Transportation Needs   Lack of Transportation (Medical): Yes   Lack of Transportation (Non-Medical): Yes  Physical Activity: Inactive   Days of Exercise per Week: 0 days   Minutes of Exercise per Session: 0 min  Stress: No Stress Concern Present   Feeling of Stress : Only a little  Social Connections: Moderately Integrated   Frequency of Communication with Friends and Family: More than three times a week   Frequency of Social Gatherings with Friends and Family: Once a week   Attends Religious Services: 1 to 4 times per year   Active Member of [employer] or Organizations: No   Attends Engineer, structural: Never   Marital Status: Married    Tobacco Counseling Counseling given: Not Answered   Clinical Intake:  Pre-visit preparation completed: Yes  Pain : 0-10 Pain Score: 5  Pain Type: Chronic pain Pain Location: Back Pain Orientation: Lower Pain Descriptors / Indicators: Aching, Sharp, Shooting Pain Onset: More than a month ago Pain Frequency: Intermittent     BMI - recorded: 31.42 Nutritional Status: BMI > 30  Obese Nutritional Risks: None (loose stool at times) Diabetes: Yes CBG done?: No Did pt. bring in CBG monitor from home?: No  How often do you need to have someone help you when you read instructions, pamphlets, or other written materials from your doctor or pharmacy?: 1 - Never  Diabetic?Nutrition Risk Assessment:  Has the patient had any N/V/D within the last 2 months?  No  Does the patient have any non-healing wounds?  No  Has the  patient had any unintentional weight loss or weight gain?  No   Diabetes:  Is the patient diabetic?  Yes  If diabetic, was a CBG obtained today?  No  Did the patient bring in their glucometer from home?  No  How often do you monitor your CBG's? N/A.   Financial Strains and Diabetes Management:  Are you having any financial strains with the device, your supplies or your medication? No .  Does the patient  want to be seen by Chronic Care Management for management of their diabetes?  No  Would the patient like to be referred to a Nutritionist or for Diabetic Management?  No   Diabetic Exams:  Diabetic Eye Exam: Completed 07/23/20 Diabetic Foot Exam: Overdue, Pt has been advised about the importance in completing this exam. Pt is scheduled for diabetic foot exam on next appt .   Interpreter Needed?: No  Information entered by :: Lanier Ensign, LPN   Activities of Daily Living In your present state of health, do you have any difficulty performing the following activities: 11/27/2020  Hearing? N  Vision? Y  Difficulty concentrating or making decisions? N  Walking or climbing stairs? Y  Comment related to depth persception  Dressing or bathing? N  Doing errands, shopping? N  Preparing Food and eating ? Y  Comment back hurts  Using the Toilet? N  In the past six months, have you accidently leaked urine? Y  Comment wears a brief and pad  Do you have problems with loss of bowel control? Y  Managing your Medications? N  Managing your Finances? N  Housekeeping or managing your Housekeeping? N  Some recent data might be hidden    Patient Care Team: Ardith Dark, MD as PCP - General (Family Medicine)  Indicate any recent Medical Services you may have received from other than [facility] providers in the past year (date may be approximate).     Assessment:   This is a routine wellness examination for [place].  Hearing/Vision screen Hearing Screening - Comments:: Pt denies any  hearing issues  Vision Screening - Comments:: Pt follows up with dr Dione Booze for annual eye exams   Dietary issues and exercise activities discussed: Current Exercise Habits: The patient does not participate in regular exercise at present   Goals Addressed             This Visit's Progress    Patient Stated       None at this time       Depression Screen PHQ 2/9 Scores 11/27/2020 10/12/2019 09/26/2018  PHQ - 2 Score 0 0 0    Fall Risk Fall Risk  11/27/2020 10/12/2019  Falls in the past year? 1 1  Number falls in past yr: 1 1  Injury with Fall? 1 1  Comment brusing and hurt back -  Risk for fall due to : Impaired vision;Impaired balance/gait Impaired mobility;Impaired vision;Impaired balance/gait  Risk for fall due to: Comment - has a hard time with depth perciption  Follow up Falls prevention discussed -    FALL RISK PREVENTION PERTAINING TO THE HOME:  Any stairs in or around the home? No  If so, are there any without handrails? No  Home free of loose throw rugs in walkways, pet beds, electrical cords, etc? Yes  Adequate lighting in your home to reduce risk of falls? Yes   ASSISTIVE DEVICES UTILIZED TO PREVENT FALLS:  Life alert? No  Use of a cane, walker or w/c? No  Grab bars in the bathroom? No  Shower chair or bench in shower? Yes  Elevated toilet seat or a handicapped toilet? Yes   TIMED UP AND GO:  Was the test performed? Yes .  Length of time to ambulate 10 feet: 10 sec.   Gait steady and fast without use of assistive device  Cognitive Function:     6CIT Screen 11/27/2020 10/12/2019  What Year? 0 points 0 points  What month? 0 points 0  points  What time? 0 points 0 points  Count back from 20 0 points 0 points  Months in reverse 0 points 0 points  Repeat phrase 0 points 4 points  Total Score 0 4    Immunizations Immunization History  Administered Date(s) Administered   Moderna Sars-Covid-2 Vaccination 08/13/2019, 09/10/2019    TDAP status: Due,  Education has been provided regarding the importance of this vaccine. Advised may receive this vaccine at local pharmacy or Health Dept. Aware to provide a copy of the vaccination record if obtained from local pharmacy or Health Dept. Verbalized acceptance and understanding.  Flu Vaccine status: Declined, Education has been provided regarding the importance of this vaccine but patient still declined. Advised may receive this vaccine at local pharmacy or Health Dept. Aware to provide a copy of the vaccination record if obtained from local pharmacy or Health Dept. Verbalized acceptance and understanding.  Pneumococcal vaccine status: Declined,  Education has been provided regarding the importance of this vaccine but patient still declined. Advised may receive this vaccine at local pharmacy or Health Dept. Aware to provide a copy of the vaccination record if obtained from local pharmacy or Health Dept. Verbalized acceptance and understanding.   Covid-19 vaccine status: Completed vaccines  Qualifies for Shingles Vaccine? Yes   Zostavax completed No   Shingrix Completed?: No.    Education has been provided regarding the importance of this vaccine. Patient has been advised to call insurance company to determine out of pocket expense if they have not yet received this vaccine. Advised may also receive vaccine at local pharmacy or Health Dept. Verbalized acceptance and understanding.  Screening Tests Health Maintenance  Topic Date Due   Hepatitis C Screening  Never done   Zoster Vaccines- Shingrix (1 of 2) Never done   FOOT EXAM  09/26/2019   COVID-19 Vaccine (3 - Booster for Moderna series) 02/10/2020   TETANUS/TDAP  09/25/2028 (Originally 08/30/1964)   PNA vac Low Risk Adult (1 of 2 - PCV13) 09/25/2028 (Originally 08/31/2011)   HEMOGLOBIN A1C  01/28/2021   OPHTHALMOLOGY EXAM  07/23/2021   MAMMOGRAM  01/31/2022   COLONOSCOPY (Pts 45-29yrs Insurance coverage will need to be confirmed)  12/04/2025    DEXA SCAN  Completed   HPV VACCINES  Aged Out   INFLUENZA VACCINE  Discontinued    Health Maintenance  Health Maintenance Due  Topic Date Due   Hepatitis C Screening  Never done   Zoster Vaccines- Shingrix (1 of 2) Never done   FOOT EXAM  09/26/2019   COVID-19 Vaccine (3 - Booster for Moderna series) 02/10/2020    Colorectal cancer screening: Type of screening: Colonoscopy. Completed 12/05/15. Repeat every 10 years  Mammogram status: Completed 02/01/20. Repeat every year  Bone Density status: Completed 02/01/20. Results reflect: Bone density results: NORMAL. Repeat every 2 years.   Additional Screening:  Hepatitis C Screening: does qualify;   Vision Screening: Recommended annual ophthalmology exams for early detection of glaucoma and other disorders of the eye. Is the patient up to date with their annual eye exam?  Yes  Who is the provider or what is the name of the office in which the patient attends annual eye exams? Dr Dione Booze  If pt is not established with a provider, would they like to be referred to a provider to establish care? No .   Dental Screening: Recommended annual dental exams for proper oral hygiene  Community Resource Referral / Chronic Care Management: CRR required this visit?  Yes  CCM required this visit?  Yes      Plan:     I have personally reviewed and noted the following in the patient's chart:   Medical and social history Use of alcohol, tobacco or illicit drugs  Current medications and supplements including opioid prescriptions.  Functional ability and status Nutritional status Physical activity Advanced directives List of other physicians Hospitalizations, surgeries, and ER visits in previous 12 months Vitals Screenings to include cognitive, depression, and falls Referrals and appointments  In addition, I have reviewed and discussed with patient certain preventive protocols, quality metrics, and best practice recommendations. A written  personalized care plan for preventive services as well as general preventive health recommendations were provided to patient.     Marzella Schlein, LPN   2/81/1886   Nurse Notes: pt stated she has been having sinus issues and cluster headache. Pt wants to know what OTC med she can take if any?

## 2020-11-28 ENCOUNTER — Telehealth: Payer: Self-pay

## 2020-11-28 NOTE — Telephone Encounter (Signed)
Excellent thank you

## 2020-11-30 ENCOUNTER — Other Ambulatory Visit: Payer: Self-pay | Admitting: Family Medicine

## 2020-12-05 ENCOUNTER — Telehealth: Payer: Self-pay

## 2020-12-05 ENCOUNTER — Telehealth: Payer: Self-pay | Admitting: *Deleted

## 2020-12-05 NOTE — Telephone Encounter (Signed)
   Telephone encounter was:  Successful.  12/05/2020 Name: Brandi Cooper MRN: [id number] DOB: [birth year]  Brandi Cooper is a 75 y.o. year old female who is a primary care patient of [name], Katina Degree, MD . The community resource team was consulted for assistance with Transportation Needs , Food Insecurity, and Caregiver Stresspatient transportation , water aerobics , silver sneakers , Food banks and out of garden project .  Care guide performed the following interventions: Patient provided with information about care guide support team and interviewed to confirm resource needs Follow up call placed to community resources to determine status of patients referral.  Follow Up Plan:  Care guide will follow up with patient by phone over the next days  Alois Cliche -[facility] Guide , Embedded Care Coordination [facility], Care Management  [phone] [address] [place] , [place] [address] Email : [email or web address]

## 2020-12-05 NOTE — Telephone Encounter (Signed)
Patient called in requesting a return phone call. She would not go into further detail. States that it is in regards to her recent wellness visit.

## 2020-12-08 NOTE — Telephone Encounter (Signed)
   Telephone encounter was:  Successful.  12/08/2020 Name: Timmya Blazier MRN: [id number] DOB: 17-May-1945  Cattaleya Wien is a 75 y.o. year old female who is a primary care patient of [name], Katina Degree, MD . The community resource team was consulted for assistance with Transportation Needs  and Food Insecurity  Care guide performed the following interventions: Patient provided with information about care guide support team and interviewed to confirm resource needs Follow up call placed to community resources to determine status of patients referral.  Follow Up Plan:  No further follow up planned at this time. The patient has been provided with needed resources. Alois Cliche -[facility] Guide , Embedded Care Coordination [facility], Care Management  [phone] [address] [place] , [place] [address] Email : [email or web address]

## 2020-12-08 NOTE — Telephone Encounter (Signed)
I agree with referral to case management if needed. Please place referral.  Jennessy Sandridge M. [name], MD 12/08/2020 7:54 AM

## 2020-12-09 ENCOUNTER — Telehealth: Payer: Self-pay | Admitting: Family Medicine

## 2020-12-09 NOTE — Telephone Encounter (Signed)
   Chantee Cerino DOB: 10/01/1945 MRN: [id number]   RIDER WAIVER AND RELEASE OF LIABILITY  For purposes of improving physical access to our facilities, [facility] is pleased to partner with third parties to provide [facility] patients or other authorized individuals the option of convenient, on-demand ground transportation services (the [employer]") through use of the technology service that enables users to request on-demand ground transportation from independent third-party providers.  By opting to use and accept these [employer], I, the undersigned, hereby agree on behalf of myself, and on behalf of any minor child using the Science writer for whom I am the parent or legal guardian, as follows:  Science writer provided to me are provided by independent third-party transportation providers who are not [employer] or employees and who are unaffiliated with [employer]. [facility] [facility] is neither a transportation carrier nor a common or public carrier. [facility] has no control over the quality or safety of the transportation that occurs as a result of the [employer]. [facility] cannot guarantee that any third-party transportation provider will complete any arranged transportation service. [facility] makes no representation, warranty, or guarantee regarding the reliability, timeliness, quality, safety, suitability, or availability of any of the Transport Services or that they will be error free. I fully understand that traveling by vehicle involves risks and dangers of serious bodily injury, including permanent disability, paralysis, and death. I agree, on behalf of myself and on behalf of any minor child using the Transport Services for whom I am the parent or legal guardian, that the entire risk arising out of my use of the [employer] remains solely with me, to the maximum extent permitted under applicable law. The [employer] are provided "as  is" and "as available." [facility] disclaims all representations and warranties, express, implied or statutory, not expressly set out in these terms, including the implied warranties of merchantability and fitness for a particular purpose. I hereby waive and release [facility], its agents, employees, officers, directors, representatives, insurers, attorneys, assigns, successors, subsidiaries, and affiliates from any and all past, present, or future claims, demands, liabilities, actions, causes of action, or suits of any kind directly or indirectly arising from acceptance and use of the [employer]. I further waive and release [facility] and its affiliates from all present and future liability and responsibility for any injury or death to persons or damages to property caused by or related to the use of the [employer]. I have read this Waiver and Release of Liability, and I understand the terms used in it and their legal significance. This Waiver is freely and voluntarily given with the understanding that my right (as well as the right of any minor child for whom I am the parent or legal guardian using the [employer]) to legal recourse against  in connection with the [employer] is knowingly surrendered in return for use of these services.   I attest that I read the consent document to Fonnie Mu, gave Ms. Kunzman the opportunity to ask questions and answered the questions asked (if any). I affirm that Fonnie Mu then provided consent for she's participation in this program.     Launa Grill

## 2020-12-10 ENCOUNTER — Ambulatory Visit (HOSPITAL_COMMUNITY)
Admission: RE | Admit: 2020-12-10 | Discharge: 2020-12-10 | Disposition: A | Discharge status: Home / Self Care | Payer: Medicare Other | Source: Ambulatory Visit | Attending: Nephrology | Admitting: Nephrology

## 2020-12-10 VITALS — BP 112/57 | HR 61 | Temp 97.1°F | Resp 16

## 2020-12-10 DIAGNOSIS — D631 Anemia in chronic kidney disease: Secondary | ICD-10-CM | POA: Insufficient documentation

## 2020-12-10 DIAGNOSIS — N183 Chronic kidney disease, stage 3 unspecified: Secondary | ICD-10-CM | POA: Insufficient documentation

## 2020-12-10 LAB — IRON AND TIBC
Iron: 82 ug/dL (ref 28–170)
Saturation Ratios: 26 % (ref 10.4–31.8)
TIBC: 312 ug/dL (ref 250–450)
UIBC: 230 ug/dL

## 2020-12-10 LAB — POCT HEMOGLOBIN-HEMACUE: Hemoglobin: 10.7 g/dL — ABNORMAL LOW (ref 12.0–15.0)

## 2020-12-10 LAB — FERRITIN: Ferritin: 526 ng/mL — ABNORMAL HIGH (ref 11–307)

## 2020-12-10 MED ORDER — EPOETIN ALFA-EPBX 10000 UNIT/ML IJ SOLN: 15000.0000 [IU] | INTRAMUSCULAR | Status: DC

## 2020-12-10 MED ORDER — EPOETIN ALFA-EPBX 10000 UNIT/ML IJ SOLN
INTRAMUSCULAR | Status: AC
  Administered 2020-12-10: 15000 [IU] via SUBCUTANEOUS
  Filled 2020-12-10: qty 2

## 2020-12-11 ENCOUNTER — Other Ambulatory Visit: Payer: Self-pay | Admitting: *Deleted

## 2020-12-11 NOTE — Telephone Encounter (Signed)
Referral placed.

## 2020-12-22 IMAGING — US US RENAL
1 series · 14 of 25 positions shown · non-contrast
Comparison: None.

CLINICAL DATA: Chronic kidney disease, stage III.  Diabetes.

EXAM:
RENAL / URINARY TRACT ULTRASOUND COMPLETE

[Series 1: us renal · 0.18mm/px · 14 of 31 slices shown]
[im 1/31]
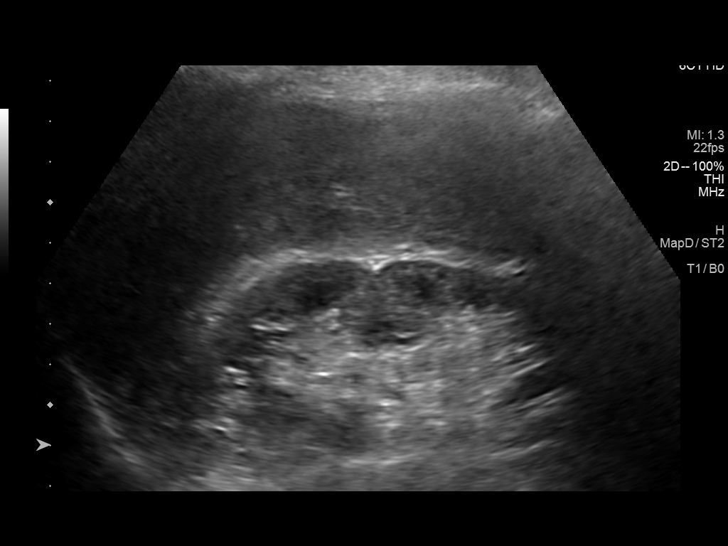
[im 3/31]
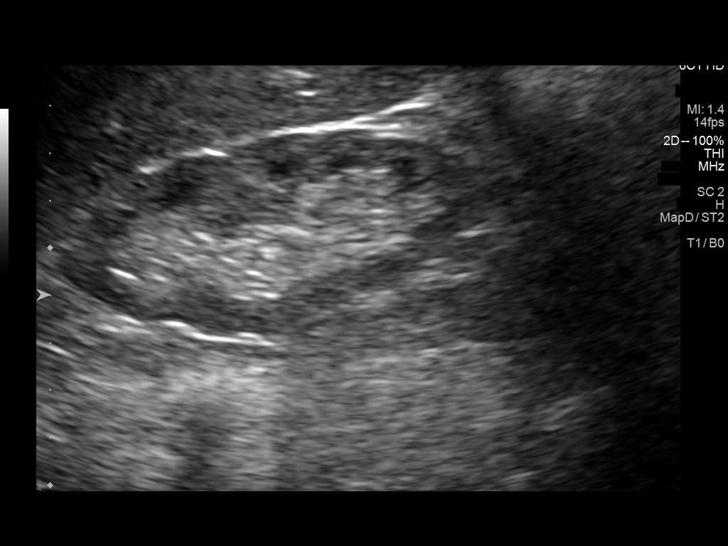
[im 6/31]
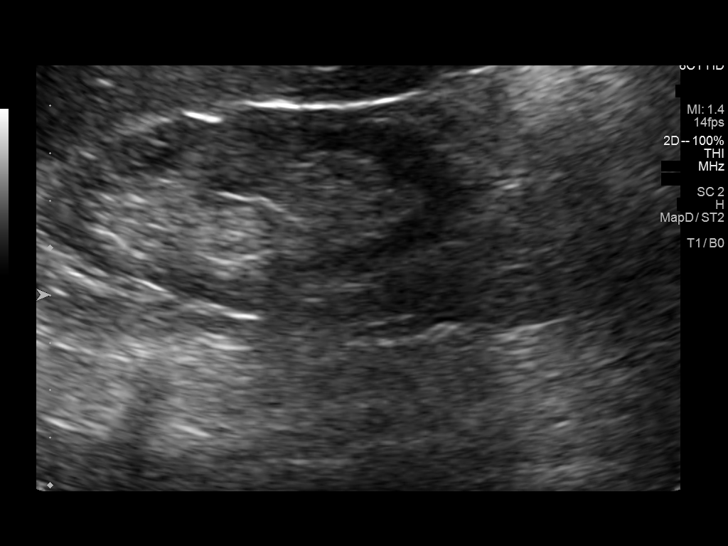
[im 8/31]
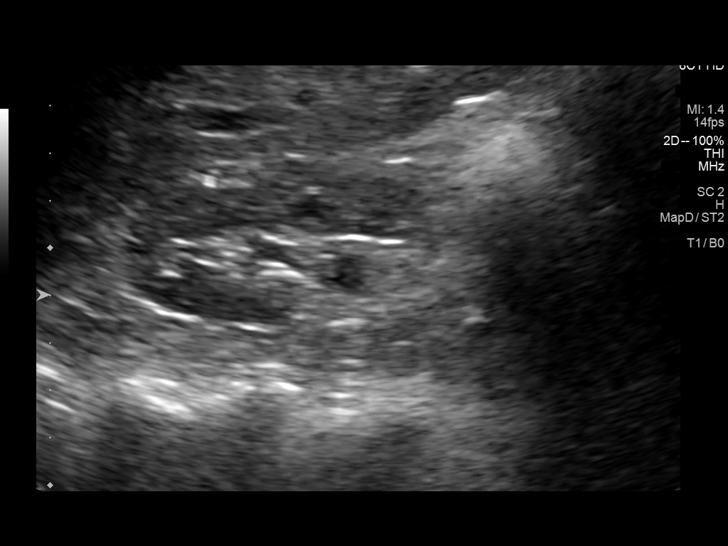
[im 11/31]
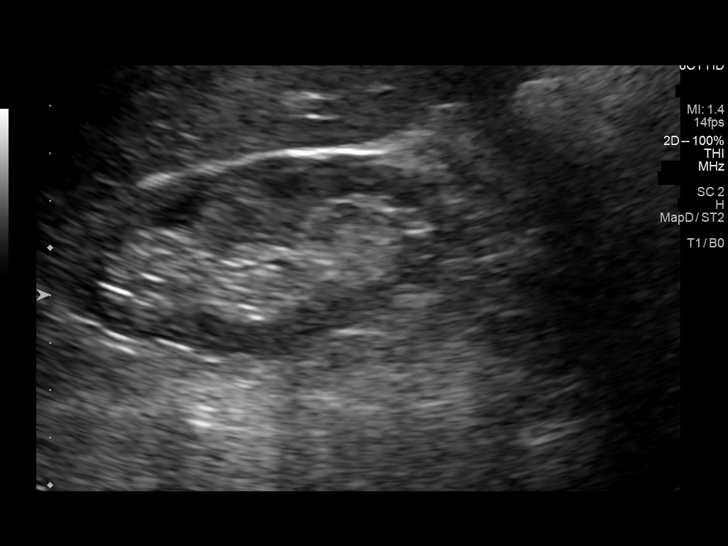
[im 12/31]
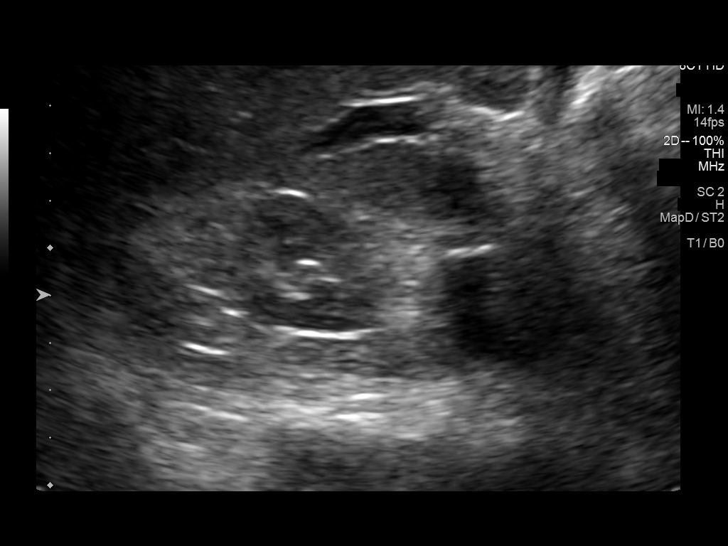
[im 14/31]
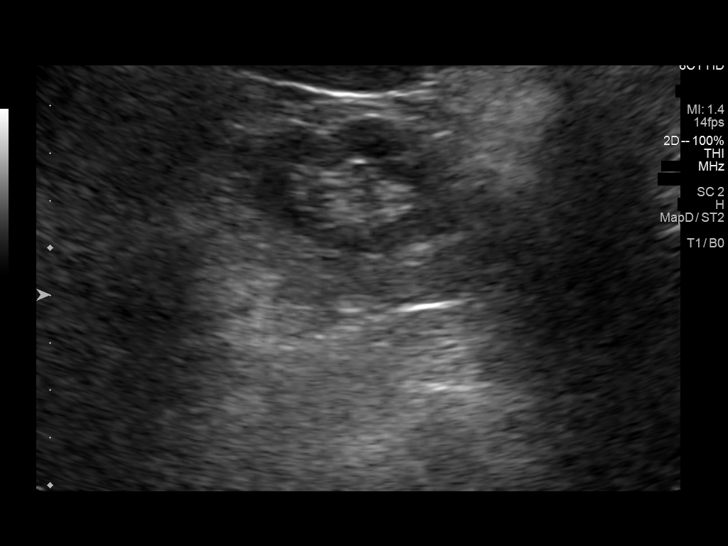
[im 17/31]
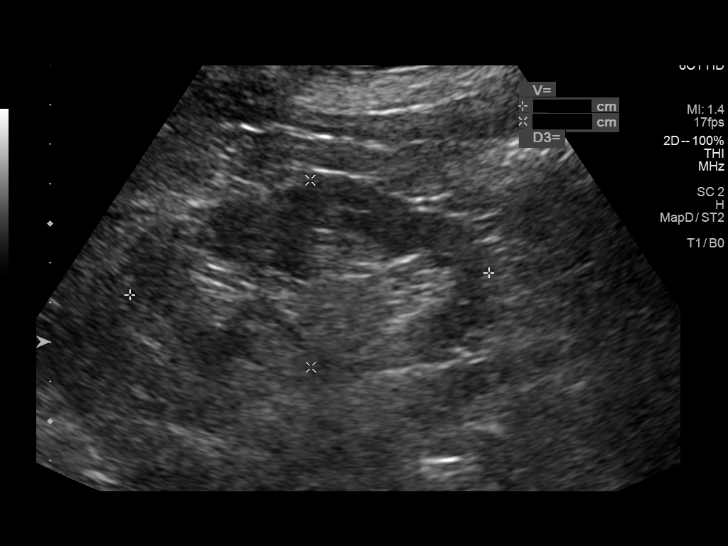
[im 19/31]
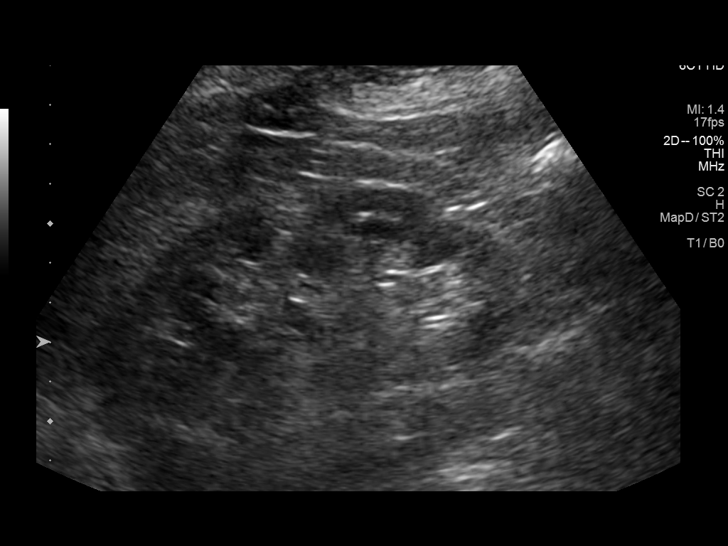
[im 21/31]
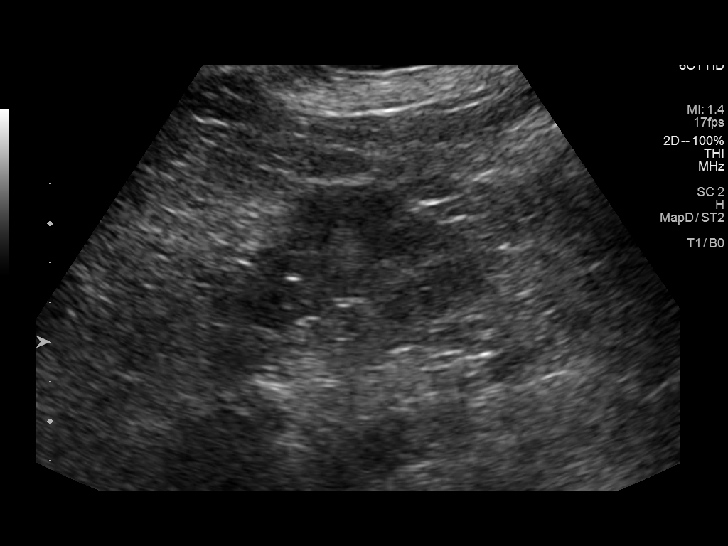
[im 23/31]
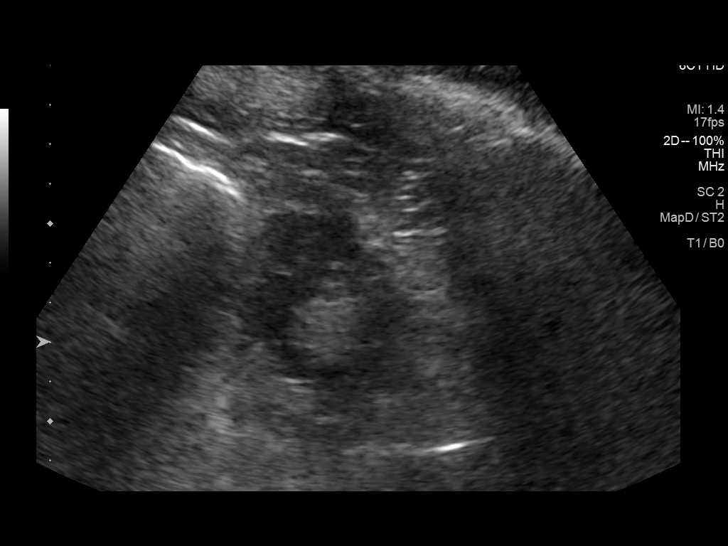
[im 26/31]
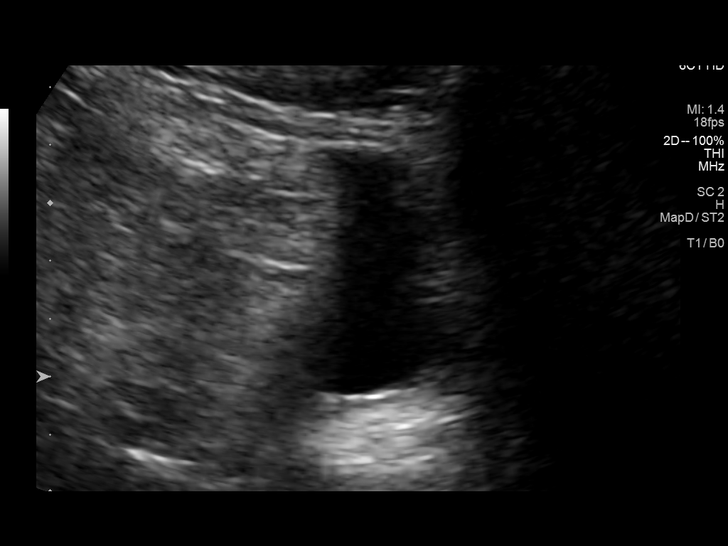
[im 28/31]
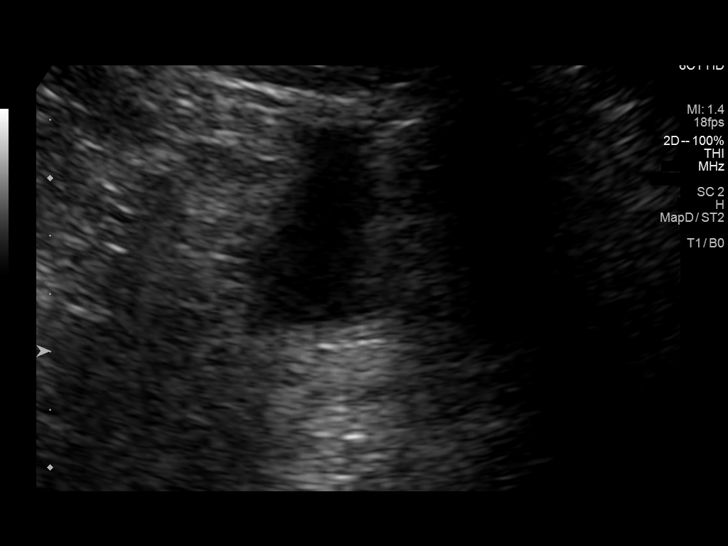
[im 31/31]
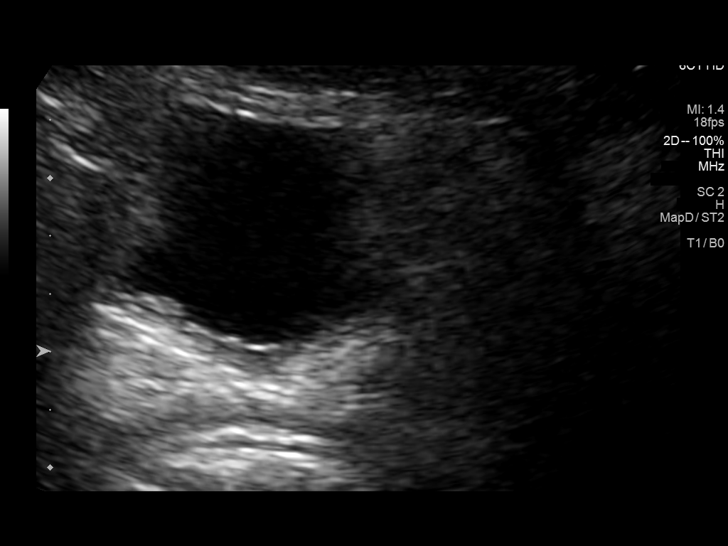

[14 of 25 positions shown; findings below may reference images not displayed]

FINDINGS: Right Kidney:

Renal measurements: 9.3 x 4.0 x 4.3 cm = volume: 83 mL .
Echogenicity within normal limits. No mass or hydronephrosis
visualized.

Left Kidney:

Renal measurements: 9.1 x 4.7 x 4.2 cm = volume: 95 mL. Echogenicity
within normal limits. No mass or hydronephrosis visualized.

Bladder:

Appears normal for degree of bladder distention.

Other:

None.
IMPRESSION: Normal size and appearance of both kidneys. No evidence of
hydronephrosis.

## 2021-01-04 ENCOUNTER — Other Ambulatory Visit: Payer: Self-pay | Admitting: Family Medicine

## 2021-01-07 ENCOUNTER — Other Ambulatory Visit: Payer: Self-pay

## 2021-01-07 ENCOUNTER — Ambulatory Visit (HOSPITAL_COMMUNITY)
Admission: RE | Admit: 2021-01-07 | Discharge: 2021-01-07 | Disposition: A | Discharge status: Home / Self Care | Payer: Medicare Other | Source: Ambulatory Visit | Attending: Nephrology | Admitting: Nephrology

## 2021-01-07 VITALS — BP 134/64 | HR 65 | Temp 97.1°F | Resp 18

## 2021-01-07 DIAGNOSIS — D631 Anemia in chronic kidney disease: Secondary | ICD-10-CM | POA: Diagnosis not present

## 2021-01-07 DIAGNOSIS — N183 Chronic kidney disease, stage 3 unspecified: Secondary | ICD-10-CM | POA: Diagnosis not present

## 2021-01-07 LAB — IRON AND TIBC
Iron: 67 ug/dL (ref 28–170)
Saturation Ratios: 23 % (ref 10.4–31.8)
TIBC: 290 ug/dL (ref 250–450)
UIBC: 223 ug/dL

## 2021-01-07 LAB — FERRITIN: Ferritin: 498 ng/mL — ABNORMAL HIGH (ref 11–307)

## 2021-01-07 LAB — POCT HEMOGLOBIN-HEMACUE: Hemoglobin: 11.1 g/dL — ABNORMAL LOW (ref 12.0–15.0)

## 2021-01-07 MED ORDER — EPOETIN ALFA-EPBX 10000 UNIT/ML IJ SOLN
INTRAMUSCULAR | Status: AC
  Filled 2021-01-07: qty 2

## 2021-01-07 MED ORDER — EPOETIN ALFA-EPBX 10000 UNIT/ML IJ SOLN
15000.0000 [IU] | INTRAMUSCULAR | Status: DC
  Administered 2021-01-07: 15000 [IU] via SUBCUTANEOUS

## 2021-01-29 ENCOUNTER — Ambulatory Visit (INDEPENDENT_AMBULATORY_CARE_PROVIDER_SITE_OTHER): Payer: Medicare Other | Admitting: Family Medicine

## 2021-01-29 ENCOUNTER — Other Ambulatory Visit: Payer: Self-pay

## 2021-01-29 VITALS — BP 111/57 | HR 74 | Temp 98.3°F | Ht 65.0 in | Wt 186.4 lb

## 2021-01-29 DIAGNOSIS — I152 Hypertension secondary to endocrine disorders: Secondary | ICD-10-CM

## 2021-01-29 DIAGNOSIS — N183 Chronic kidney disease, stage 3 unspecified: Secondary | ICD-10-CM | POA: Diagnosis not present

## 2021-01-29 DIAGNOSIS — E1159 Type 2 diabetes mellitus with other circulatory complications: Secondary | ICD-10-CM

## 2021-01-29 DIAGNOSIS — E1122 Type 2 diabetes mellitus with diabetic chronic kidney disease: Secondary | ICD-10-CM | POA: Diagnosis not present

## 2021-01-29 DIAGNOSIS — Z13220 Encounter for screening for lipoid disorders: Secondary | ICD-10-CM | POA: Diagnosis not present

## 2021-01-29 DIAGNOSIS — D649 Anemia, unspecified: Secondary | ICD-10-CM

## 2021-01-29 DIAGNOSIS — Z0001 Encounter for general adult medical examination with abnormal findings: Secondary | ICD-10-CM | POA: Diagnosis not present

## 2021-01-29 DIAGNOSIS — M1990 Unspecified osteoarthritis, unspecified site: Secondary | ICD-10-CM | POA: Insufficient documentation

## 2021-01-29 LAB — LIPID PANEL
Cholesterol: 181 mg/dL (ref 0–200)
HDL: 53.7 mg/dL (ref >39.00)
LDL Cholesterol: 107 mg/dL — ABNORMAL HIGH (ref 0–99)
NonHDL: 127.34
Total CHOL/HDL Ratio: 3
Triglycerides: 104 mg/dL (ref 0.0–149.0)
VLDL: 20.8 mg/dL (ref 0.0–40.0)

## 2021-01-29 LAB — COMPREHENSIVE METABOLIC PANEL
ALT: 11 U/L (ref 0–35)
AST: 14 U/L (ref 0–37)
Albumin: 4.1 g/dL (ref 3.5–5.2)
Alkaline Phosphatase: 76 U/L (ref 39–117)
BUN: 41 mg/dL — ABNORMAL HIGH (ref 6–23)
CO2: 24 mEq/L (ref 19–32)
Calcium: 8.9 mg/dL (ref 8.4–10.5)
Chloride: 107 mEq/L (ref 96–112)
Creatinine, Ser: 1.94 mg/dL — ABNORMAL HIGH (ref 0.40–1.20)
GFR: 25.07 mL/min — ABNORMAL LOW (ref >60.00)
Glucose, Bld: 261 mg/dL — ABNORMAL HIGH (ref 70–99)
Potassium: 3.9 mEq/L (ref 3.5–5.1)
Sodium: 139 mEq/L (ref 135–145)
Total Bilirubin: 0.5 mg/dL (ref 0.2–1.2)
Total Protein: 7.3 g/dL (ref 6.0–8.3)

## 2021-01-29 LAB — HEMOGLOBIN A1C: Hgb A1c MFr Bld: 6.8 % — ABNORMAL HIGH (ref 4.6–6.5)

## 2021-01-29 LAB — CBC
HCT: 34.4 % — ABNORMAL LOW (ref 36.0–46.0)
Hemoglobin: 10.9 g/dL — ABNORMAL LOW (ref 12.0–15.0)
MCHC: 31.7 g/dL (ref 30.0–36.0)
MCV: 91.3 fl (ref 78.0–100.0)
Platelets: 204 10*3/uL (ref 150.0–400.0)
RBC: 3.77 Mil/uL — ABNORMAL LOW (ref 3.87–5.11)
RDW: 15.5 % (ref 11.5–15.5)
WBC: 4.9 10*3/uL (ref 4.0–10.5)

## 2021-01-29 LAB — TSH: TSH: 1.95 u[IU]/mL (ref 0.35–5.50)

## 2021-01-29 NOTE — Assessment & Plan Note (Signed)
At goal on lisinopril 20mg daily.  

## 2021-01-29 NOTE — Progress Notes (Signed)
Chief Complaint:  Brandi Cooper is a 75 y.o. female who presents today for her annual comprehensive physical exam.    Assessment/Plan:  Chronic Problems Addressed Today: Hypertension associated with diabetes ([place]) At goal on lisinopril 20mg  daily.   CKD (chronic kidney disease) stage 3, GFR 30-59 ml/min (HCC) Check CMET.   Type 2 diabetes mellitus with stage 3 chronic kidney disease, without long-term current use of insulin (HCC) Check A1c. Continue glipizide 20mg  twice daily and actos 45mg  daily.   Anemia Check CBC.   Osteoarthritis She has had worsening pain in knees and ankles over the last several months.  We will update plain films and likely refer to sports medicine.  She had orthopedist while in [place] but has not seen any wearing off year since relocating to [place].   Body mass index is 31.02 kg/m. / Obese  BMI Metric Follow Up - 01/29/21 1408       BMI Metric Follow Up-Please document annually   BMI Metric Follow Up Education provided             Preventative Healthcare: Check labs. Declined pneumonia vaccine.   Patient Counseling(The following topics were reviewed and/or handout was given):  -Nutrition: Stressed importance of moderation in sodium/caffeine intake, saturated fat and cholesterol, caloric balance, sufficient intake of fresh fruits, vegetables, and fiber.  -Stressed the importance of regular exercise.   -Substance Abuse: Discussed cessation/primary prevention of tobacco, alcohol, or other drug use; driving or other dangerous activities under the influence; availability of treatment for abuse.   -Injury prevention: Discussed safety belts, safety helmets, smoke detector, smoking near bedding or upholstery.   -Sexuality: Discussed sexually transmitted diseases, partner selection, use of condoms, avoidance of unintended pregnancy and contraceptive alternatives.   -Dental health: Discussed importance of regular tooth brushing, flossing, and  dental visits.  -Health maintenance and immunizations reviewed. Please refer to Health maintenance section.  Return to care in 1 year for next preventative visit.     Subjective:  HPI:  She has no acute complaints today.   Lifestyle Diet: Balanced. Exercise: None specific.   Depression screen PHQ 2/9 01/29/2021  Decreased Interest 0  Down, Depressed, Hopeless 0  PHQ - 2 Score 0    Health Maintenance Due  Topic Date Due   Hepatitis C Screening  Never done   FOOT EXAM  09/26/2019   HEMOGLOBIN A1C  01/28/2021     ROS: Per HPI, otherwise a complete review of systems was negative.   PMH:  The following were reviewed and entered/updated in epic: Past Medical History:  Diagnosis Date   Chronic kidney disease    Diabetes mellitus without complication ([place])    GERD (gastroesophageal reflux disease)    Glaucoma    Heart disease    Patient Active Problem List   Diagnosis Date Noted   Osteoarthritis 01/29/2021   Vertigo 09/26/2018   Cluster headaches 09/26/2018   Dry mouth 09/26/2018   Type 2 diabetes mellitus with stage 3 chronic kidney disease, without long-term current use of insulin ([place]) 03/30/2018   CKD (chronic kidney disease) stage 3, GFR 30-59 ml/min ([place]) 03/30/2018   Hypertension associated with diabetes ([place]) 03/30/2018   Glaucoma 03/30/2018   Chronic pain of both shoulders 03/30/2018   Allergic rhinitis 03/30/2018   Anemia 03/30/2018   Past Surgical History:  Procedure Laterality Date   ABDOMINAL HYSTERECTOMY     blood transfusion     BREAST SURGERY     Breast Reduction   CERVICAL FUSION  EYE SURGERY     KNEE CARTILAGE SURGERY     LUMBAR DISC SURGERY     REDUCTION MAMMAPLASTY     RETINOPATHY SURGERY     SHOULDER SURGERY Bilateral    Rotator Cuff   TRABECULECTOMY     TUBAL LIGATION     VITRECTOMY AND CATARACT     WRIST SURGERY     Carpal Tunnel    Family History  Problem Relation Age of Onset   Diabetes Mother    Hypertension Mother     Stroke Mother    Diabetes Father    Arthritis Sister    Cancer Sister    Heart attack Sister    Cancer Brother    Hypertension Daughter    Cancer Son    Stroke Maternal Grandmother    Hypertension Maternal Grandmother    Diabetes Paternal Grandmother    Heart attack Paternal Grandfather    Cancer Sister    Cancer Sister    Stroke Sister    Hypertension Sister    Diabetes Brother    Kidney disease Brother     Medications- reviewed and updated Current Outpatient Medications  Medication Sig Dispense Refill   acetaZOLAMIDE (DIAMOX) 250 MG tablet      Ascorbic Acid (VITAMIN C) 1000 MG tablet Take 1,000 mg by mouth daily.     aspirin 81 MG tablet Take 81 mg by mouth daily.     Biotin 5000 MCG CAPS Take by mouth.     brimonidine (ALPHAGAN) 0.2 % ophthalmic solution 3 (three) times daily.     Cholecalciferol (VITAMIN D3) 50 MCG (2000 UT) capsule Take by mouth.     dorzolamide (TRUSOPT) 2 % ophthalmic solution Place into both eyes 3 (three) times daily.      glipiZIDE (GLUCOTROL) 10 MG tablet TAKE 2 TABLETS(20 MG) BY MOUTH TWICE DAILY BEFORE A MEAL 360 tablet 0   GNP CINNAMON PO Take by mouth. Takes 2000mg  2 capfuls daily     lisinopril (ZESTRIL) 20 MG tablet TAKE 1 TABLET(20 MG) BY MOUTH DAILY 90 tablet 0   Omega-3 Fatty Acids (FISH OIL) 1200 MG CAPS Take 2 capsules by mouth.     omeprazole (PRILOSEC) 40 MG capsule Take 40 mg by mouth daily.     pioglitazone (ACTOS) 45 MG tablet TAKE 1 TABLET(45 MG) BY MOUTH DAILY 90 tablet 1   ROCKLATAN 0.02-0.005 % SOLN Apply 1 drop to eye at bedtime.     sodium bicarbonate 650 MG tablet Take 1,300 mg by mouth 2 (two) times daily.     timolol (BETIMOL) 0.5 % ophthalmic solution Place 1 drop into both eyes 2 (two) times daily.     torsemide (DEMADEX) 20 MG tablet TAKE 1 TABLET(20 MG) BY MOUTH DAILY (Patient taking differently: Twice a day) 90 tablet 1   No current facility-administered medications for this visit.    Allergies-reviewed and  updated Allergies  Allergen Reactions   Amitriptyline    Nsaids     Was told to avoid all NSAIDs due to kidney function    Social History   Socioeconomic History   Marital status: Married    Spouse name: Not on file   Number of children: Not on file   Years of education: Not on file   Highest education level: Not on file  Occupational History   Occupation: retired  Tobacco Use   Smoking status: Never   Smokeless tobacco: Never  Vaping Use   Vaping Use: Never used  Substance and Sexual  Activity   Alcohol use: Never   Drug use: Never   Sexual activity: Not on file  Other Topics Concern   Not on file  Social History Narrative   Not on file   Social Determinants of Health   Financial Resource Strain: Medium Risk   Difficulty of Paying Living Expenses: Somewhat hard  Food Insecurity: Food Insecurity Present   Worried About Running Out of Food in the Last Year: Sometimes true   Ran Out of Food in the Last Year: Sometimes true  Transportation Needs: Unmet Transportation Needs   Lack of Transportation (Medical): Yes   Lack of Transportation (Non-Medical): Yes  Physical Activity: Inactive   Days of Exercise per Week: 0 days   Minutes of Exercise per Session: 0 min  Stress: No Stress Concern Present   Feeling of Stress : Only a little  Social Connections: Moderately Integrated   Frequency of Communication with Friends and Family: More than three times a week   Frequency of Social Gatherings with Friends and Family: Once a week   Attends Religious Services: 1 to 4 times per year   Active Member of [employer] or Organizations: No   Attends Music therapist: Never   Marital Status: Married        Objective:  Physical Exam: BP (!) 111/57   Pulse 74   Temp 98.3 F (36.8 C) (Temporal)   Ht 5\' 5"  (1.651 m)   Wt 186 lb 6.4 oz (84.6 kg)   SpO2 98%   BMI 31.02 kg/m   Body mass index is 31.02 kg/m. Wt Readings from Last 3 Encounters:  01/29/21 186 lb 6.4 oz  (84.6 kg)  11/27/20 188 lb 12.8 oz (85.6 kg)  07/28/20 188 lb 9.6 oz (85.5 kg)   Gen: NAD, resting comfortably HEENT: TMs normal bilaterally. OP clear. No thyromegaly noted.  CV: RRR with no murmurs appreciated Pulm: NWOB, CTAB with no crackles, wheezes, or rhonchi GI: Normal bowel sounds present. Soft, Nontender, Nondistended. MSK: no edema, cyanosis, or clubbing noted Skin: warm, dry Neuro: CN2-12 grossly intact. Strength 5/5 in upper and lower extremities. Reflexes symmetric and intact bilaterally.  Psych: Normal affect and thought content     Myldred Raju M. Jerline Pain, MD 01/29/2021 2:10 PM

## 2021-01-29 NOTE — Assessment & Plan Note (Signed)
Check CBC 

## 2021-01-29 NOTE — Assessment & Plan Note (Signed)
Check CMET. 

## 2021-01-29 NOTE — Assessment & Plan Note (Signed)
She has had worsening pain in knees and ankles over the last several months.  We will update plain films and likely refer to sports medicine.  She had orthopedist while in [place] but has not seen any wearing off year since relocating to [place].

## 2021-01-29 NOTE — Assessment & Plan Note (Signed)
Check A1c. Continue glipizide 20mg  twice daily and actos 45mg  daily.

## 2021-01-29 NOTE — Patient Instructions (Signed)
It was very nice to see you today!  We will check blood work today.  We will get x-rays.  Please continue to work with diet and exercise.  I will see you back in 6 months.  Please come back to see me sooner if needed.  Take care, Dr Jerline Pain  PLEASE NOTE:  If you had any lab tests please let us know if you have not heard back within a few days. You may see your results on mychart before we have a chance to review them but we will give you a call once they are reviewed by [place]. If we ordered any referrals today, please let us know if you have not heard from their office within the next week.   Please try these tips to maintain a healthy lifestyle:  Eat at least 3 REAL meals and 1-2 snacks per day.  Aim for no more than 5 hours between eating.  If you eat breakfast, please do so within one hour of getting up.   Each meal should contain half fruits/vegetables, one quarter protein, and one quarter carbs (no bigger than a computer mouse)  Cut down on sweet beverages. This includes juice, soda, and sweet tea.   Drink at least 1 glass of water with each meal and aim for at least 8 glasses per day  Exercise at least 150 minutes every week.    Preventive Care 91 Years and Older, Female Preventive care refers to lifestyle choices and visits with your health care provider that can promote health and wellness. Preventive care visits are also called wellness exams. What can I expect for my preventive care visit? Counseling Your health care provider may ask you questions about your: Medical history, including: Past medical problems. Family medical history. Pregnancy and menstrual history. History of falls. Current health, including: Memory and ability to understand (cognition). Emotional well-being. Home life and relationship well-being. Sexual activity and sexual health. Lifestyle, including: Alcohol, nicotine or tobacco, and drug use. Access to firearms. Diet, exercise, and sleep  habits. Work and work Statistician. Sunscreen use. Safety issues such as seatbelt and bike helmet use. Physical exam Your health care provider will check your: Height and weight. These may be used to calculate your BMI (body mass index). BMI is a measurement that tells if you are at a healthy weight. Waist circumference. This measures the distance around your waistline. This measurement also tells if you are at a healthy weight and may help predict your risk of certain diseases, such as type 2 diabetes and high blood pressure. Heart rate and blood pressure. Body temperature. Skin for abnormal spots. What immunizations do I need? Vaccines are usually given at various ages, according to a schedule. Your health care provider will recommend vaccines for you based on your age, medical history, and lifestyle or other factors, such as travel or where you work. What tests do I need? Screening Your health care provider may recommend screening tests for certain conditions. This may include: Lipid and cholesterol levels. Hepatitis C test. Hepatitis B test. HIV (human immunodeficiency virus) test. STI (sexually transmitted infection) testing, if you are at risk. Lung cancer screening. Colorectal cancer screening. Diabetes screening. This is done by checking your blood sugar (glucose) after you have not eaten for a while (fasting). Mammogram. Talk with your health care provider about how often you should have regular mammograms. BRCA-related cancer screening. This may be done if you have a family history of breast, ovarian, tubal, or peritoneal cancers. Bone density  scan. This is done to screen for osteoporosis. Talk with your health care provider about your test results, treatment options, and if necessary, the need for more tests. Follow these instructions at home: Eating and drinking  Eat a diet that includes fresh fruits and vegetables, whole grains, lean protein, and low-fat dairy products.  Limit your intake of foods with high amounts of sugar, saturated fats, and salt. Take vitamin and mineral supplements as recommended by your health care provider. Do not drink alcohol if your health care provider tells you not to drink. If you drink alcohol: Limit how much you have to 0-1 drink a day. Know how much alcohol is in your drink. In the [place] one drink equals one 12 oz bottle of beer (355 mL), one 5 oz glass of wine (148 mL), or one 1 oz glass of hard liquor (44 mL). Lifestyle Brush your teeth every morning and night with fluoride toothpaste. Floss one time each day. Exercise for at least 30 minutes 5 or more days each week. Do not use any products that contain nicotine or tobacco. These products include cigarettes, chewing tobacco, and vaping devices, such as e-cigarettes. If you need help quitting, ask your health care provider. Do not use drugs. If you are sexually active, practice safe sex. Use a condom or other form of protection in order to prevent STIs. Take aspirin only as told by your health care provider. Make sure that you understand how much to take and what form to take. Work with your health care provider to find out whether it is safe and beneficial for you to take aspirin daily. Ask your health care provider if you need to take a cholesterol-lowering medicine (statin). Find healthy ways to manage stress, such as: Meditation, yoga, or listening to music. Journaling. Talking to a trusted person. Spending time with friends and family. Minimize exposure to UV radiation to reduce your risk of skin cancer. Safety Always wear your seat belt while driving or riding in a vehicle. Do not drive: If you have been drinking alcohol. Do not ride with someone who has been drinking. When you are tired or distracted. While texting. If you have been using any mind-altering substances or drugs. Wear a helmet and other protective equipment during sports activities. If you have  firearms in your house, make sure you follow all gun safety procedures. What's next? Visit your health care provider once a year for an annual wellness visit. Ask your health care provider how often you should have your eyes and teeth checked. Stay up to date on all vaccines. This information is not intended to replace advice given to you by your health care provider. Make sure you discuss any questions you have with your health care provider. Document Revised: 08/27/2020 Document Reviewed: 08/27/2020 Elsevier Patient Education  [place].

## 2021-01-30 NOTE — Progress Notes (Signed)
Please inform patient of the following:  Her cholesterol and blood sugar are borderline but otherwise her labs are all stable. Do not need to make any changes to her treatment plan at this time. We should recheck in a year.

## 2021-02-04 ENCOUNTER — Ambulatory Visit (INDEPENDENT_AMBULATORY_CARE_PROVIDER_SITE_OTHER)
Admission: RE | Admit: 2021-02-04 | Discharge: 2021-02-04 | Disposition: A | Discharge status: Home / Self Care | Payer: Medicare Other | Source: Ambulatory Visit | Attending: Family Medicine | Admitting: Family Medicine

## 2021-02-04 ENCOUNTER — Ambulatory Visit (HOSPITAL_COMMUNITY)
Admission: RE | Admit: 2021-02-04 | Discharge: 2021-02-04 | Disposition: A | Discharge status: Home / Self Care | Payer: Medicare Other | Source: Ambulatory Visit | Attending: Nephrology | Admitting: Nephrology

## 2021-02-04 ENCOUNTER — Other Ambulatory Visit: Payer: Self-pay

## 2021-02-04 VITALS — BP 99/58 | HR 76 | Temp 97.0°F | Resp 18

## 2021-02-04 DIAGNOSIS — D631 Anemia in chronic kidney disease: Secondary | ICD-10-CM | POA: Insufficient documentation

## 2021-02-04 DIAGNOSIS — N183 Chronic kidney disease, stage 3 unspecified: Secondary | ICD-10-CM | POA: Diagnosis present

## 2021-02-04 DIAGNOSIS — M1990 Unspecified osteoarthritis, unspecified site: Secondary | ICD-10-CM | POA: Diagnosis not present

## 2021-02-04 LAB — FERRITIN: Ferritin: 525 ng/mL — ABNORMAL HIGH (ref 11–307)

## 2021-02-04 LAB — IRON AND TIBC
Iron: 56 ug/dL (ref 28–170)
Saturation Ratios: 18 % (ref 10.4–31.8)
TIBC: 307 ug/dL (ref 250–450)
UIBC: 251 ug/dL

## 2021-02-04 MED ORDER — EPOETIN ALFA-EPBX 10000 UNIT/ML IJ SOLN
INTRAMUSCULAR | Status: AC
  Filled 2021-02-04: qty 2

## 2021-02-04 MED ORDER — EPOETIN ALFA-EPBX 10000 UNIT/ML IJ SOLN
15000.0000 [IU] | INTRAMUSCULAR | Status: DC
  Administered 2021-02-04: 15000 [IU] via SUBCUTANEOUS

## 2021-02-08 ENCOUNTER — Other Ambulatory Visit: Payer: Self-pay | Admitting: Family Medicine

## 2021-02-09 ENCOUNTER — Telehealth: Payer: Self-pay

## 2021-02-09 LAB — POCT HEMOGLOBIN-HEMACUE: Hemoglobin: 11.1 g/dL — ABNORMAL LOW (ref 12.0–15.0)

## 2021-02-09 NOTE — Progress Notes (Signed)
Please inform patient of the following:  Her xrays show arthritis. Recommend referral to sports medicine.  Brandi Cooper. [name], MD 02/09/2021 8:03 AM

## 2021-02-09 NOTE — Telephone Encounter (Signed)
LAST APPOINTMENT DATE:  01/29/21  NEXT APPOINTMENT DATE: 07/31/21  MEDICATION:pioglitazone (ACTOS) 45 MG tablet  PHARMACY: [id number] - [place], [place] - [address] AT [facility] [address] [facility] [address]

## 2021-02-09 NOTE — Telephone Encounter (Signed)
Rx send to pharmacy  

## 2021-02-11 ENCOUNTER — Other Ambulatory Visit: Payer: Self-pay

## 2021-02-11 DIAGNOSIS — M1990 Unspecified osteoarthritis, unspecified site: Secondary | ICD-10-CM

## 2021-02-22 ENCOUNTER — Other Ambulatory Visit: Payer: Self-pay | Admitting: Family Medicine

## 2021-02-24 NOTE — Progress Notes (Signed)
Subjective:    CC: B knee and ankle pain  I, Brandi Cooper, LAT, ATC, am serving as scribe for Dr. Clementeen Cooper.  HPI: Pt is a 75 y/o female presenting w/ c/o chronic B knee and ankle pain due to OA that has been worsening over the last few months.  She locates her pain to .  Knee pain: Chronic pain x years that began after falling while walking across a freeway in 2016.  She locates her pain to her B ant knees, L>R. -Swelling: yes -Mechanical symptoms: yes -Aggravating factors: walking/weight-bearing activity; pain at night in bed -Treatments tried: nothing really  Ankle pain: pain x approximately one year -Swelling: yes in both her ankles and feet -Aggravating factors: walking/weight-bearing acitivity -Treatments tried: nothing   Diagnostic testing: R and L knee and L ankle XR- 02/04/21  Pertinent review of Systems: No fevers or chills  Relevant historical information: Diabetes and CKD. History lumbar fusion.  Objective:    Vitals:   02/25/21 1036  BP: 110/60  Pulse: 61  SpO2: 93%   General: Well Developed, well nourished, and in no acute distress.   MSK:  Right knee normal-appearing normal motion with crepitation.  Tender palpation anterior knee.  Intact strength.  Left knee normal-appearing normal motion with crepitation.  Tender palpation anterior knee.  Intact strength.  Left ankle and foot swelling with nonpitting edema. Mildly tender palpation ATFL region. Normal motion.  Intact strength. Stable ligamentous exam.  Lab and Radiology Results  EXAM: RIGHT KNEE - 2 VIEW   COMPARISON:  None.   FINDINGS: No acute fracture or dislocation is noted. No soft tissue abnormality is seen.   IMPRESSION: No acute abnormality noted.     Electronically Signed   By: Alcide Clever M.D.   On: 02/04/2021 20:13   EXAM: LEFT KNEE - 2 VIEW   COMPARISON:  None.   FINDINGS: No acute fracture or dislocation is noted. Mild degenerative changes in the  patellofemoral joint space are seen. No joint effusion is noted.   IMPRESSION: Mild degenerative change without acute abnormality.     Electronically Signed   By: Alcide Clever M.D.   On: 02/04/2021 20:13  EXAM: LEFT ANKLE - 2 VIEW   COMPARISON:  None.   FINDINGS: Soft tissue swelling is noted most prominent laterally. No acute fracture or dislocation is noted. Calcaneal spurring is seen.   IMPRESSION: No acute fracture is noted.  Mild soft tissue swelling is seen.     Electronically Signed   By: Alcide Clever M.D.   On: 02/04/2021 20:07    I, Brandi Cooper, personally (independently) visualized and performed the interpretation of the images attached in this note.     Impression and Recommendations:    Assessment and Plan: 75 y.o. female with bilateral knee pain.  Patient has primarily anterior knee pain.  She does not have a significant amount of DJD to explain her pain.  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  She is a great candidate for physical therapy focusing on quad strength.  After discussion she like to do aquatic physical therapy which is a good option for her.  Recommend Tylenol and Voltaren gel.  Recommend avoiding oral NSAIDs given her CKD.  She has some left ankle pain as well.  Again not much DJD to explain her pain.  I think a lot of her ankle discomfort is coming from her nonpitting edema bilateral lower  extremities and some due to her DJD.  For starting point should be compression stockings and physical therapy.  Again Voltaren gel should be helpful.  Recheck in 8 weeks.  Return sooner if needed.Marland Kitchen  PDMP not reviewed this encounter. Orders Placed This Encounter  Procedures   Ambulatory referral to Physical Therapy    Referral Priority:   Routine    Referral Type:   Physical Medicine    Referral Reason:   Specialty Services Required    Requested Specialty:   Physical Therapy    Number of Visits  Requested:   1   No orders of the defined types were placed in this encounter.   Discussed warning signs or symptoms. Please see discharge instructions. Patient expresses understanding.   The above documentation has been reviewed and is accurate and complete Brandi Cooper, M.D.

## 2021-02-25 ENCOUNTER — Encounter: Payer: Self-pay | Admitting: Family Medicine

## 2021-02-25 ENCOUNTER — Ambulatory Visit: Payer: Self-pay

## 2021-02-25 ENCOUNTER — Other Ambulatory Visit: Payer: Self-pay

## 2021-02-25 ENCOUNTER — Ambulatory Visit (INDEPENDENT_AMBULATORY_CARE_PROVIDER_SITE_OTHER): Payer: Medicare Other | Admitting: Family Medicine

## 2021-02-25 VITALS — BP 110/60 | HR 61 | Ht 65.0 in | Wt 191.4 lb

## 2021-02-25 DIAGNOSIS — M25571 Pain in right ankle and joints of right foot: Secondary | ICD-10-CM

## 2021-02-25 DIAGNOSIS — R609 Edema, unspecified: Secondary | ICD-10-CM

## 2021-02-25 DIAGNOSIS — G8929 Other chronic pain: Secondary | ICD-10-CM

## 2021-02-25 DIAGNOSIS — M25561 Pain in right knee: Secondary | ICD-10-CM | POA: Diagnosis not present

## 2021-02-25 DIAGNOSIS — M25562 Pain in left knee: Secondary | ICD-10-CM

## 2021-02-25 DIAGNOSIS — M25572 Pain in left ankle and joints of left foot: Secondary | ICD-10-CM

## 2021-02-25 NOTE — Patient Instructions (Addendum)
Nice to meet you today.  I've referred you to aquatic physical therapy.  Please let us know if you haven't heard from them in one week regarding scheduling.  Recommend using compression stockings for ankle and lower leg swelling.  Please use Voltaren gel (Generic Diclofenac Gel) up to 4x daily for pain as needed.  This is available over-the-counter as both the name brand Voltaren gel and the generic diclofenac gel.   Follow-up: 8 weeks

## 2021-03-04 ENCOUNTER — Ambulatory Visit (HOSPITAL_COMMUNITY)
Admission: RE | Admit: 2021-03-04 | Discharge: 2021-03-04 | Disposition: A | Discharge status: Home / Self Care | Payer: Medicare Other | Source: Ambulatory Visit | Attending: Nephrology | Admitting: Nephrology

## 2021-03-04 ENCOUNTER — Other Ambulatory Visit: Payer: Self-pay

## 2021-03-04 VITALS — BP 112/56 | HR 57 | Temp 97.3°F | Resp 18

## 2021-03-04 DIAGNOSIS — N183 Chronic kidney disease, stage 3 unspecified: Secondary | ICD-10-CM | POA: Diagnosis present

## 2021-03-04 DIAGNOSIS — D631 Anemia in chronic kidney disease: Secondary | ICD-10-CM | POA: Diagnosis not present

## 2021-03-04 LAB — RENAL FUNCTION PANEL
Albumin: 3.5 g/dL (ref 3.5–5.0)
Anion gap: 7 (ref 5–15)
BUN: 47 mg/dL — ABNORMAL HIGH (ref 8–23)
CO2: 20 mmol/L — ABNORMAL LOW (ref 22–32)
Calcium: 8.8 mg/dL — ABNORMAL LOW (ref 8.9–10.3)
Chloride: 109 mmol/L (ref 98–111)
Creatinine, Ser: 1.98 mg/dL — ABNORMAL HIGH (ref 0.44–1.00)
GFR, Estimated: 26 mL/min — ABNORMAL LOW (ref >60)
Glucose, Bld: 178 mg/dL — ABNORMAL HIGH (ref 70–99)
Phosphorus: 3.7 mg/dL (ref 2.5–4.6)
Potassium: 4.4 mmol/L (ref 3.5–5.1)
Sodium: 136 mmol/L (ref 135–145)

## 2021-03-04 LAB — POCT HEMOGLOBIN-HEMACUE: Hemoglobin: 10.8 g/dL — ABNORMAL LOW (ref 12.0–15.0)

## 2021-03-04 LAB — IRON AND TIBC
Iron: 93 ug/dL (ref 28–170)
Saturation Ratios: 29 % (ref 10.4–31.8)
TIBC: 323 ug/dL (ref 250–450)
UIBC: 230 ug/dL

## 2021-03-04 LAB — VITAMIN D 25 HYDROXY (VIT D DEFICIENCY, FRACTURES): Vit D, 25-Hydroxy: 64.77 ng/mL (ref 30–100)

## 2021-03-04 LAB — FERRITIN: Ferritin: 434 ng/mL — ABNORMAL HIGH (ref 11–307)

## 2021-03-04 MED ORDER — EPOETIN ALFA-EPBX 10000 UNIT/ML IJ SOLN
INTRAMUSCULAR | Status: AC
  Filled 2021-03-04: qty 2

## 2021-03-04 MED ORDER — EPOETIN ALFA-EPBX 10000 UNIT/ML IJ SOLN
15000.0000 [IU] | INTRAMUSCULAR | Status: DC
  Administered 2021-03-04: 10:00:00 15000 [IU] via SUBCUTANEOUS

## 2021-03-05 LAB — PTH, INTACT AND CALCIUM
Calcium, Total (PTH): 8.9 mg/dL (ref 8.7–10.3)
PTH: 111 pg/mL — ABNORMAL HIGH (ref 15–65)

## 2021-03-09 ENCOUNTER — Other Ambulatory Visit: Payer: Self-pay | Admitting: Family Medicine

## 2021-03-25 ENCOUNTER — Other Ambulatory Visit: Payer: Self-pay

## 2021-03-25 ENCOUNTER — Ambulatory Visit (HOSPITAL_BASED_OUTPATIENT_CLINIC_OR_DEPARTMENT_OTHER): Payer: Medicare Other | Attending: Family Medicine | Admitting: Physical Therapy

## 2021-03-25 ENCOUNTER — Encounter (HOSPITAL_BASED_OUTPATIENT_CLINIC_OR_DEPARTMENT_OTHER): Payer: Self-pay | Admitting: Physical Therapy

## 2021-03-25 DIAGNOSIS — M25571 Pain in right ankle and joints of right foot: Secondary | ICD-10-CM | POA: Diagnosis not present

## 2021-03-25 DIAGNOSIS — M25572 Pain in left ankle and joints of left foot: Secondary | ICD-10-CM | POA: Diagnosis not present

## 2021-03-25 DIAGNOSIS — G8929 Other chronic pain: Secondary | ICD-10-CM | POA: Diagnosis not present

## 2021-03-25 DIAGNOSIS — M25561 Pain in right knee: Secondary | ICD-10-CM | POA: Diagnosis present

## 2021-03-25 DIAGNOSIS — M25562 Pain in left knee: Secondary | ICD-10-CM | POA: Diagnosis not present

## 2021-03-25 DIAGNOSIS — R262 Difficulty in walking, not elsewhere classified: Secondary | ICD-10-CM

## 2021-03-25 DIAGNOSIS — M6281 Muscle weakness (generalized): Secondary | ICD-10-CM

## 2021-03-25 NOTE — Therapy (Signed)
OUTPATIENT PHYSICAL THERAPY LOWER EXTREMITY EVALUATION   Patient Name: Brandi Cooper MRN: [id number] DOB:08/28/1945, 76 y.o., female Today's Date: 03/25/2021   PT End of Session - 03/25/21 1116     Visit Number 1    Number of Visits 21    Date for PT Re-Evaluation 06/23/21    Authorization Type BCBS Medicare    PT Start Time 1115   pt arrives late   PT Stop Time 1145    PT Time Calculation (min) 30 min    Activity Tolerance Patient tolerated treatment well    Behavior During Therapy WFL for tasks assessed/performed             Past Medical History:  Diagnosis Date   Chronic kidney disease    Diabetes mellitus without complication ([place])    GERD (gastroesophageal reflux disease)    Glaucoma    Heart disease    Past Surgical History:  Procedure Laterality Date   ABDOMINAL HYSTERECTOMY     blood transfusion     BREAST SURGERY     Breast Reduction   CERVICAL FUSION     EYE SURGERY     KNEE CARTILAGE SURGERY     LUMBAR DISC SURGERY     REDUCTION MAMMAPLASTY     RETINOPATHY SURGERY     SHOULDER SURGERY Bilateral    Rotator Cuff   TRABECULECTOMY     TUBAL LIGATION     VITRECTOMY AND CATARACT     WRIST SURGERY     Carpal Tunnel   Patient Active Problem List   Diagnosis Date Noted   Osteoarthritis 01/29/2021   Vertigo 09/26/2018   Cluster headaches 09/26/2018   Dry mouth 09/26/2018   Type 2 diabetes mellitus with stage 3 chronic kidney disease, without long-term current use of insulin ([place]) 03/30/2018   CKD (chronic kidney disease) stage 3, GFR 30-59 ml/min ([place]) 03/30/2018   Hypertension associated with diabetes ([place]) 03/30/2018   Glaucoma 03/30/2018   Chronic pain of both shoulders 03/30/2018   Allergic rhinitis 03/30/2018   Anemia 03/30/2018    PCP: Vivi Barrack, MD  REFERRING PROVIDER: Gregor Hams, MD  REFERRING DIAG: M25.561,M25.562,G89.29 (ICD-10-CM) - Chronic pain of both knees M25.571,G89.29,M25.572 (ICD-10-CM) - Chronic pain of both ankles    THERAPY DIAG:  Chronic pain of right knee - Plan: PT plan of care cert/re-cert  Chronic pain of left knee - Plan: PT plan of care cert/re-cert  Muscle weakness (generalized) - Plan: PT plan of care cert/re-cert  Difficulty walking - Plan: PT plan of care cert/re-cert  ONSET DATE: [id number]  SUBJECTIVE: Daughter and granddaughter present during session  SUBJECTIVE STATEMENT: Pt states the knee imaging does not show issues but she thinks her back and history of illness  contribute to the pain. She states she will feel a radiating pain down to both feet night. She will have pain in the front to sides of the knees. She does have bilateral edema- likely due to kidneys. Pt does have NT into the toes that is due to neuropathy. Pt and daughter report significant history of falls. She will loose her balance. 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 Pt states her back is also a limiting factor. Pt unable to do house work due to the pain in the back. Pt does have vision issues that also contribute to falls. Pt does perform regular feet checks.   PERTINENT HISTORY: Stage 3 CKD, diabetes, lumbar pain, neuropathy, glaucoma, diabetic retinopathy  PAIN:  Are you having pain? No NPRS scale: 0/10 Worst 10/10 Pain location: bilateral knees Pain orientation: Bilateral  PAIN TYPE: aching, burning, sharp, and tingling Pain description: intermittent  Aggravating factors: walking, down stairs, bending Relieving factors: heat  PRECAUTIONS: Knee  WEIGHT BEARING RESTRICTIONS No  FALLS:  Has patient fallen in last 6 months? Yes, Number of falls: 2- picking something off the floor  LIVING ENVIRONMENT: Lives with: lives with their family Lives in: House/apartment Stairs: Yes;  Has following equipment at home: Single point cane  OCCUPATION: retired  PLOF: Independent with basic ADLs  PATIENT GOALS : pt would like to be able to walk better. She is going on a  cruise this year in Oct and wants to walk freely during.    OBJECTIVE:   DIAGNOSTIC FINDINGS:  L knee FINDINGS: No acute fracture or dislocation is noted. Mild degenerative changes in the patellofemoral joint space are seen. No joint effusion is noted.   IMPRESSION: Mild degenerative change without acute abnormality.    R knee FINDINGS: No acute fracture or dislocation is noted. No soft tissue abnormality is seen.   IMPRESSION: No acute abnormality noted.  PATIENT SURVEYS:  FOTO unable due to time   COGNITION:  Overall cognitive status: Within functional limits for tasks assessed     SENSATION:  Light touch: Deficits neuropathy in legs     POSTURE:  Kyphotic, decreased lumbar lordosis, flex hip gait  PALPATION: TTP R quad and lateral joint line No TTP of L   LE AROM/PROM:  A/PROM Right 03/25/2021 Left 03/25/2021  Hip extension -5 -10  Knee flexion [facility] WFL  Knee extension [facility] WFL  Ankle dorsiflexion -10 -10   (Blank rows = not tested)  LE MMT:  MMT Right 03/25/2021 Left 03/25/2021  Knee flexion 4+/5 4+/5  Knee extension 4/5 4/5   (Blank rows = not tested)  LOWER EXTREMITY SPECIAL TESTS:  Knee special tests: Anterior drawer test: negative and Varus/Valgus   FUNCTIONAL TESTS:  5 times sit to stand: unable to perform without hands, pain with stand TUG: 26s (difficulty with transfer)    GAIT: Distance walked: 77ft Assistive device utilized: None Level of assistance: SBA Comments: decreased step length, antalgic, decreased L hip ROM, decreased heel strike     TODAY'S TREATMENT:   Exercises Sit to Stand with Armchair - 2 x daily - 7 x weekly - 1 sets - 10 reps Sitting Knee Extension with Resistance - 1 x daily - 7 x weekly - 3 sets - 10 reps    PATIENT EDUCATION:  Education details: MOI, diagnosis, prognosis, anatomy, exercise progression, DOMS expectations, muscle firing,  envelope of function, HEP, POC  Person educated: Patient Education  method: Explanation, Demonstration, Tactile cues, Verbal cues, and Handouts Education comprehension: verbalized understanding, returned demonstration, verbal cues required, and tactile cues required   HOME EXERCISE PROGRAM: Access Code: [email or web address] URL: [email or web address] Date: 03/25/2021 Prepared by: Daleen Bo  ASSESSMENT:  CLINICAL IMPRESSION: Patient is a 76 y.o. female who was seen today for physical therapy evaluation and treatment for cc of bilat knee and ankle pain. Pt's s/s appear consistent with anterior knee pain that is likely due to quadriceps weakness. Pt's history of LBP also plays a large role in radicular pain gait deviations. Pt's pain is moderately sensitive and irritable with movement. Pt presents as a significant fall risk due to vision deficits, neuropathy, and LE strength deficits.   Objective impairments include Abnormal gait, decreased activity tolerance, decreased balance, decreased endurance, decreased  mobility, difficulty walking, decreased ROM, decreased strength, hypomobility, increased muscle spasms, impaired flexibility, improper body mechanics, postural dysfunction, and pain. These impairments are limiting patient from cleaning, community activity, driving, meal prep, laundry, yard work, shopping, and exercise/recreation . Personal factors including Age, Fitness, Time since onset of injury/illness/exacerbation, and 1-2 comorbidities:    are also affecting patient's functional outcome. Patient will benefit from skilled PT to address above impairments and improve overall function.  REHAB POTENTIAL: Fair    CLINICAL DECISION MAKING: Evolving/moderate complexity  EVALUATION COMPLEXITY: Moderate   GOALS:   SHORT TERM GOALS:  STG Name Target Date Goal status  1 Pt will become independent with HEP in order to demonstrate synthesis of PT education.  Baseline:  05/06/2021 INITIAL  2 Pt will report at least 2 pt reduction on NPRS scale for pain in  order to demonstrate functional improvement with household activity, self care, and ADL.  Baseline:  05/06/2021 INITIAL  4 Pt will be able to demonstrate STS without UE support in order to demonstrate functional improvement in LE function for self-care and house hold duties.  Baseline: 05/06/2021 INITIAL   LONG TERM GOALS:   LTG Name Target Date Goal status  1 Pt  will become independent with final HEP in order to demonstrate synthesis of PT education.  Baseline: 06/17/2021 INITIAL  2 Pt will be able to demonstrate/report ability to walk >10 mins without pain in order to demonstrate functional improvement and tolerance to exercise and community mobility.   Baseline: 06/17/2021 INITIAL  3 Pt will be able to perform 5XSTS in under 12s  in order to demonstrate functional improvement above the cut off score for adults.  Baseline: 06/17/2021 INITIAL  4 Pt will be able to demonstrate TUG in under 10 sec in order to demonstrate functional improvement in LE function, strength, balance, and mobility for safety with community ambulation.  06/17/2021 INITIAL   PLAN: PT FREQUENCY: 1-2x/week  PT DURATION: 12 weeks   PLANNED INTERVENTIONS: Therapeutic exercises, Therapeutic activity, Neuro Muscular re-education, Balance training, Gait training, Patient/Family education, Joint mobilization, Stair training, Aquatic Therapy, Dry Needling, Electrical stimulation, Spinal mobilization, Cryotherapy, Moist heat, Manual lymph drainage, Compression bandaging, Taping, Vasopneumatic device, Traction, Ultrasound, Ionotophoresis 4mg /ml Dexamethasone, and Manual therapy  PLAN FOR NEXT SESSION: intro to aquatics- work on quad strength and balance; reinforce usage of cane when not in home setting; L/S stretching to improve L LE ROM during  Daleen Bo PT, DPT 03/25/21 12:58 PM

## 2021-04-01 ENCOUNTER — Encounter (HOSPITAL_COMMUNITY): Payer: Medicare Other

## 2021-04-02 ENCOUNTER — Other Ambulatory Visit: Payer: Self-pay | Admitting: Family Medicine

## 2021-04-08 ENCOUNTER — Ambulatory Visit (HOSPITAL_COMMUNITY)
Admission: RE | Admit: 2021-04-08 | Discharge: 2021-04-08 | Disposition: A | Discharge status: Home / Self Care | Payer: Medicare Other | Source: Ambulatory Visit | Attending: Nephrology | Admitting: Nephrology

## 2021-04-08 ENCOUNTER — Other Ambulatory Visit: Payer: Self-pay

## 2021-04-08 VITALS — BP 136/77 | HR 70

## 2021-04-08 DIAGNOSIS — D631 Anemia in chronic kidney disease: Secondary | ICD-10-CM | POA: Insufficient documentation

## 2021-04-08 DIAGNOSIS — N183 Chronic kidney disease, stage 3 unspecified: Secondary | ICD-10-CM | POA: Insufficient documentation

## 2021-04-08 LAB — IRON AND TIBC
Iron: 81 ug/dL (ref 28–170)
Saturation Ratios: 26 % (ref 10.4–31.8)
TIBC: 307 ug/dL (ref 250–450)
UIBC: 226 ug/dL

## 2021-04-08 LAB — FERRITIN: Ferritin: 497 ng/mL — ABNORMAL HIGH (ref 11–307)

## 2021-04-08 LAB — POCT HEMOGLOBIN-HEMACUE: Hemoglobin: 10.2 g/dL — ABNORMAL LOW (ref 12.0–15.0)

## 2021-04-08 MED ORDER — EPOETIN ALFA-EPBX 10000 UNIT/ML IJ SOLN: 15000.0000 [IU] | INTRAMUSCULAR | Status: DC

## 2021-04-08 MED ORDER — EPOETIN ALFA-EPBX 10000 UNIT/ML IJ SOLN
INTRAMUSCULAR | Status: AC
  Administered 2021-04-08: 09:00:00 15000 [IU] via SUBCUTANEOUS
  Filled 2021-04-08: qty 2

## 2021-04-16 ENCOUNTER — Encounter: Payer: Self-pay | Admitting: Family Medicine

## 2021-04-22 ENCOUNTER — Other Ambulatory Visit: Payer: Self-pay

## 2021-04-22 ENCOUNTER — Ambulatory Visit (INDEPENDENT_AMBULATORY_CARE_PROVIDER_SITE_OTHER): Payer: Medicare Other

## 2021-04-22 ENCOUNTER — Ambulatory Visit (INDEPENDENT_AMBULATORY_CARE_PROVIDER_SITE_OTHER): Payer: Medicare Other | Admitting: Family Medicine

## 2021-04-22 ENCOUNTER — Ambulatory Visit (HOSPITAL_BASED_OUTPATIENT_CLINIC_OR_DEPARTMENT_OTHER): Payer: Medicare Other | Attending: Family Medicine | Admitting: Physical Therapy

## 2021-04-22 ENCOUNTER — Encounter (HOSPITAL_BASED_OUTPATIENT_CLINIC_OR_DEPARTMENT_OTHER): Payer: Self-pay | Admitting: Physical Therapy

## 2021-04-22 VITALS — BP 132/80 | HR 60 | Ht 65.0 in | Wt 190.0 lb

## 2021-04-22 DIAGNOSIS — M25562 Pain in left knee: Secondary | ICD-10-CM

## 2021-04-22 DIAGNOSIS — M6281 Muscle weakness (generalized): Secondary | ICD-10-CM | POA: Insufficient documentation

## 2021-04-22 DIAGNOSIS — M25571 Pain in right ankle and joints of right foot: Secondary | ICD-10-CM

## 2021-04-22 DIAGNOSIS — R262 Difficulty in walking, not elsewhere classified: Secondary | ICD-10-CM | POA: Insufficient documentation

## 2021-04-22 DIAGNOSIS — G8929 Other chronic pain: Secondary | ICD-10-CM | POA: Insufficient documentation

## 2021-04-22 DIAGNOSIS — M25561 Pain in right knee: Secondary | ICD-10-CM | POA: Insufficient documentation

## 2021-04-22 DIAGNOSIS — M545 Low back pain, unspecified: Secondary | ICD-10-CM

## 2021-04-22 DIAGNOSIS — M25572 Pain in left ankle and joints of left foot: Secondary | ICD-10-CM

## 2021-04-22 NOTE — Therapy (Signed)
OUTPATIENT PHYSICAL THERAPY TREATMENT NOTE   Patient Name: Brandi Cooper MRN: [id number] DOB:Aug 12, 1945, 76 y.o., female Today's Date: 04/22/2021  PCP: Vivi Barrack, MD REFERRING PROVIDER: Vivi Barrack, MD   PT End of Session - 04/22/21 1409     Visit Number 2    Number of Visits 21    Date for PT Re-Evaluation 06/23/21    Authorization Type BCBS Medicare    PT Start Time 1415    PT Stop Time 1500    PT Time Calculation (min) 45 min    Activity Tolerance Patient tolerated treatment well    Behavior During Therapy WFL for tasks assessed/performed             Past Medical History:  Diagnosis Date   Chronic kidney disease    Diabetes mellitus without complication ([place])    GERD (gastroesophageal reflux disease)    Glaucoma    Heart disease    Past Surgical History:  Procedure Laterality Date   ABDOMINAL HYSTERECTOMY     blood transfusion     BREAST SURGERY     Breast Reduction   CERVICAL FUSION     EYE SURGERY     KNEE CARTILAGE SURGERY     LUMBAR [place] Bilateral    Rotator Cuff   TRABECULECTOMY     TUBAL LIGATION     VITRECTOMY AND CATARACT     WRIST SURGERY     Carpal Tunnel   Patient Active Problem List   Diagnosis Date Noted   Osteoarthritis 01/29/2021   Vertigo 09/26/2018   Cluster headaches 09/26/2018   Dry mouth 09/26/2018   Type 2 diabetes mellitus with stage 3 chronic kidney disease, without long-term current use of insulin ([place]) 03/30/2018   CKD (chronic kidney disease) stage 3, GFR 30-59 ml/min ([place]) 03/30/2018   Hypertension associated with diabetes ([place]) 03/30/2018   Glaucoma 03/30/2018   Chronic pain of both shoulders 03/30/2018   Allergic rhinitis 03/30/2018   Anemia 03/30/2018    REFERRING DIAG: M25.561,M25.562,G89.29 (ICD-10-CM) - Chronic pain of both knees M25.571,G89.29,M25.572 (ICD-10-CM) - Chronic pain of both ankles   THERAPY DIAG:  Chronic  pain of right knee  Chronic pain of left knee  Muscle weakness (generalized)  Difficulty walking  PERTINENT HISTORY: Stage 3 CKD, diabetes, lumbar pain, neuropathy, glaucoma, diabetic retinopathy  PRECAUTIONS: knee  SUBJECTIVE: "I am having a little pain today but not bad 2/10"  PAIN:  Are you having pain? No NPRS scale: 2/10 Worst 10/10 Pain location: bilateral knees Pain orientation: Bilateral  PAIN TYPE: aching, burning, sharp, and tingling Pain description: intermittent  Aggravating factors: walking, down stairs, bending Relieving factors: heat  PRECAUTIONS: Knee  WEIGHT BEARING RESTRICTIONS No  FALLS:  Has patient fallen in last 6 months? Yes, Number of falls: 2- picking something off the floor  LIVING ENVIRONMENT: Lives with: lives with their family Lives in: House/apartment Stairs: Yes;  Has following equipment at home: Single point cane  OCCUPATION: retired  PLOF: Independent with basic ADLs  PATIENT GOALS : pt would like to be able to walk better. She is going on a cruise this year in Oct and wants to walk freely during.    OBJECTIVE:   DIAGNOSTIC FINDINGS:  L knee FINDINGS: No acute fracture or dislocation is noted. Mild degenerative changes in the patellofemoral joint space are seen. No joint effusion is noted.   IMPRESSION: Mild  degenerative change without acute abnormality.    R knee FINDINGS: No acute fracture or dislocation is noted. No soft tissue abnormality is seen.   IMPRESSION: No acute abnormality noted.  PATIENT SURVEYS:  FOTO unable due to time   Foto completed 04/22/21: 33  COGNITION:  Overall cognitive status: Within functional limits for tasks assessed     SENSATION:  Light touch: Deficits neuropathy in legs     POSTURE:  Kyphotic, decreased lumbar lordosis, flex hip gait  PALPATION: TTP R quad and lateral joint line No TTP of L   LE AROM/PROM:  A/PROM Right 03/25/2021 Left 03/25/2021  Hip extension -5 -10   Knee flexion [facility] WFL  Knee extension [facility] WFL  Ankle dorsiflexion -10 -10   (Blank rows = not tested)  LE MMT:  MMT Right 03/25/2021 Left 03/25/2021  Knee flexion 4+/5 4+/5  Knee extension 4/5 4/5   (Blank rows = not tested)  LOWER EXTREMITY SPECIAL TESTS:  Knee special tests: Anterior drawer test: negative and Varus/Valgus   FUNCTIONAL TESTS:  5 times sit to stand: unable to perform without hands, pain with stand TUG: 26s (difficulty with transfer)    GAIT: Distance walked: 50ft Assistive device utilized: None Level of assistance: SBA Comments: decreased step length, antalgic, decreased L hip ROM, decreased heel strike     TODAY'S TREATMENT: Pt seen for aquatic therapy today.  Treatment took place in water 3.25-4.8 ft in depth at the [facility] pool. Temp of water was 91.  Pt entered/exited the pool via stairs (step through pattern) independently with bilat rail. Foto completed  Reviewed current function, pain levels, response to prior Rx, and HEP compliance.    Seated: stretching, gastroc, hamstring 3 x 20 sec    Standing: instruction on TrA bracing throughout exercises for core engagement and strengthening -Standing kick board pushdown 3x12 -Lumbar rotations 3x20 - supported by wall: df;pf; marching;Add/abd; hip extension x10 -Hip hinges x 10 with verbal and tactile cues -LB stretching in above position 3 x 20 second hold  Pt requires buoyancy for support and to offload joints with strengthening exercises. Viscosity of the water is needed for resistance of strengthening; water current perturbations provides challenge to standing balance unsupported, requiring increased core activation.        PATIENT EDUCATION:  Education details: MOI, diagnosis, prognosis, anatomy, exercise progression, DOMS expectations, muscle firing,  envelope of function, HEP, POC  Person educated: Patient Education method: Explanation, Demonstration, Tactile cues, Verbal  cues, and Handouts Education comprehension: verbalized understanding, returned demonstration, verbal cues required, and tactile cues required   HOME EXERCISE PROGRAM: Access Code: [email or web address] URL: [email or web address] Date: 03/25/2021 Prepared by: Daleen Bo Exercises Sit to Stand with Armchair - 2 x daily - 7 x weekly - 1 sets - 10 reps Sitting Knee Extension with Resistance - 1 x daily - 7 x weekly - 3 sets - 10 reps  ASSESSMENT:  CLINICAL IMPRESSION: Pt introduced to setting.  She demonstrates indep and confidence. She is directed through stretching and strengthening challenges in all depths. Pt education on properties of water and benefits of aquatic therapy.  She reports decreased sx once submerged 75%. Her radicular pain gait deviations decrease in setting demonstrating an improved pattern. Pt is an excellent candidate for aquatic therapy and will benefit from theraputic warm environment to relax and facilitate stretching and buoyancy to decrease pain with activity improving movement and strength    Objective impairments include Abnormal gait, decreased activity tolerance, decreased balance, decreased endurance, decreased mobility,  difficulty walking, decreased ROM, decreased strength, hypomobility, increased muscle spasms, impaired flexibility, improper body mechanics, postural dysfunction, and pain. These impairments are limiting patient from cleaning, community activity, driving, meal prep, laundry, yard work, shopping, and exercise/recreation. Personal factors including Age, Fitness, Time since onset of injury/illness/exacerbation, and 1-2 comorbidities:  are also affecting patient's functional outcome. Patient will benefit from skilled PT to address above impairments and improve overall function.  REHAB POTENTIAL: Fair   CLINICAL DECISION MAKING: Evolving/moderate complexity  EVALUATION COMPLEXITY: Moderate   GOALS:   SHORT TERM GOALS:  STG Name Target Date  Goal status  1 Pt will become independent with HEP in order to demonstrate synthesis of PT education.  Baseline:  05/06/2021 INITIAL  2 Pt will report at least 2 pt reduction on NPRS scale for pain in order to demonstrate functional improvement with household activity, self care, and ADL.  Baseline:  05/06/2021 INITIAL  4 Pt will be able to demonstrate STS without UE support in order to demonstrate functional improvement in LE function for self-care and house hold duties.  Baseline: 05/06/2021 INITIAL   LONG TERM GOALS:   LTG Name Target Date Goal status  1 Pt  will become independent with final HEP in order to demonstrate synthesis of PT education.  Baseline: 06/17/2021 INITIAL  2 Pt will be able to demonstrate/report ability to walk >10 mins without pain in order to demonstrate functional improvement and tolerance to exercise and community mobility.   Baseline: 06/17/2021 INITIAL  3 Pt will be able to perform 5XSTS in under 12s  in order to demonstrate functional improvement above the cut off score for adults.  Baseline: 06/17/2021 INITIAL  4 Pt will be able to demonstrate TUG in under 10 sec in order to demonstrate functional improvement in LE function, strength, balance, and mobility for safety with community ambulation.  06/17/2021 INITIAL   PLAN: PT FREQUENCY: 1-2x/week  PT DURATION: 12 weeks   PLANNED INTERVENTIONS: Therapeutic exercises, Therapeutic activity, Neuro Muscular re-education, Balance training, Gait training, Patient/Family education, Joint mobilization, Stair training, Aquatic Therapy, Dry Needling, Electrical stimulation, Spinal mobilization, Cryotherapy, Moist heat, Manual lymph drainage, Compression bandaging, Taping, Vasopneumatic device, Traction, Ultrasound, Ionotophoresis 4mg /ml Dexamethasone, and Manual therapy  PLAN FOR NEXT SESSION:  quad strength and balance; reinforce usage of cane when not in home setting; L/S stretching to improve L LE ROM during     Stanton Kidney  Tharon Aquas) Chyenne Sobczak MPT 04/22/2021, 4:01 PM

## 2021-04-22 NOTE — Patient Instructions (Addendum)
Good to see you  Please get an X-ray today before you leave  6 week follow up

## 2021-04-22 NOTE — Progress Notes (Signed)
° °  I, Philbert Riser, LAT, ATC acting as a scribe for Clementeen Graham, MD.  Brandi Cooper is a 76 y.o. female who presents to [facility] at [facility] today for f/u bilat knees and bilat ankle pain. Pt was last seen by Dr. Denyse Amass on 02/25/21 and was advised advised to use Voltaren gel and Tylenol, and was referred to aquatic PT, completing 1 visit. Today, pt reports that she has a little pain in her back today , ankle and knee are hurting, has been using the Voltaren gel, aquatic pt starts today, tylenol isn't really doing much for the pain    Dx imaging: 02/04/21 R & L knee & L ankle XR  Pertinent review of systems: No fevers or chills  Relevant historical information: Hypertension and diabetes   Exam:  BP 132/80    Pulse 60    Ht 5\' 5"  (1.651 m)    Wt 190 lb (86.2 kg)    SpO2 99%    BMI 31.62 kg/m  General: Well Developed, well nourished, and in no acute distress.       Lab and Radiology Results  X-ray images L-spine obtained today personally and independently interpreted. Significant DDD L5-S1 and L4-L5.  No acute fractures. Await formal radiology review     Assessment and Plan: 76 y.o. female with chronic back pain.  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  Plan to add this on to existing aquatic physical therapy.  Patient already is set up to start aquatic physical therapy for her bilateral knee and ankle pain.  Start PT and check back in 6 weeks.   PDMP not reviewed this encounter. Orders Placed This Encounter  Procedures   DG Lumbar Spine 2-3 Views    Standing Status:   Future    Number of Occurrences:   1    Standing Expiration Date:   04/22/2022    Order Specific Question:   Reason for Exam (SYMPTOM  OR DIAGNOSIS REQUIRED)    Answer:   lumbar pain    Order Specific Question:   Preferred imaging location?    Answer:   06/21/2022   No orders of the defined types were placed in this encounter.    Discussed warning signs or symptoms. Please see  discharge instructions. Patient expresses understanding.   The above documentation has been reviewed and is accurate and complete Kyra Searles, M.D.

## 2021-04-24 NOTE — Progress Notes (Signed)
Lumbar spine x-ray shows advanced arthritis changes throughout the lumbar spine.

## 2021-04-29 ENCOUNTER — Encounter (HOSPITAL_BASED_OUTPATIENT_CLINIC_OR_DEPARTMENT_OTHER): Payer: Self-pay | Admitting: Physical Therapy

## 2021-04-29 ENCOUNTER — Ambulatory Visit (HOSPITAL_BASED_OUTPATIENT_CLINIC_OR_DEPARTMENT_OTHER): Payer: Medicare Other | Admitting: Physical Therapy

## 2021-04-29 ENCOUNTER — Encounter (HOSPITAL_COMMUNITY): Payer: Medicare Other

## 2021-04-29 ENCOUNTER — Other Ambulatory Visit: Payer: Self-pay

## 2021-04-29 DIAGNOSIS — M25561 Pain in right knee: Secondary | ICD-10-CM | POA: Diagnosis not present

## 2021-04-29 DIAGNOSIS — M25562 Pain in left knee: Secondary | ICD-10-CM

## 2021-04-29 DIAGNOSIS — G8929 Other chronic pain: Secondary | ICD-10-CM

## 2021-04-29 DIAGNOSIS — M6281 Muscle weakness (generalized): Secondary | ICD-10-CM

## 2021-04-29 DIAGNOSIS — R262 Difficulty in walking, not elsewhere classified: Secondary | ICD-10-CM

## 2021-04-29 NOTE — Therapy (Signed)
OUTPATIENT PHYSICAL THERAPY TREATMENT NOTE   Patient Name: Brandi Cooper MRN: [id number] DOB:25-Jul-1945, 76 y.o., female Today's Date: 04/29/2021  PCP: Ardith Dark, MD REFERRING PROVIDER: Ardith Dark, MD   PT End of Session - 04/29/21 [phone]     Visit Number 3    Number of Visits 21    Date for PT Re-Evaluation 06/23/21    Authorization Type BCBS Medicare    PT Start Time 0905    PT Stop Time 0950    PT Time Calculation (min) 45 min    Activity Tolerance Patient tolerated treatment well    Behavior During Therapy WFL for tasks assessed/performed             Past Medical History:  Diagnosis Date   Chronic kidney disease    Diabetes mellitus without complication (HCC)    GERD (gastroesophageal reflux disease)    Glaucoma    Heart disease    Past Surgical History:  Procedure Laterality Date   ABDOMINAL HYSTERECTOMY     blood transfusion     BREAST SURGERY     Breast Reduction   CERVICAL FUSION     EYE SURGERY     KNEE CARTILAGE SURGERY     LUMBAR DISC SURGERY     REDUCTION MAMMAPLASTY     RETINOPATHY SURGERY     SHOULDER SURGERY Bilateral    Rotator Cuff   TRABECULECTOMY     TUBAL LIGATION     VITRECTOMY AND CATARACT     WRIST SURGERY     Carpal Tunnel   Patient Active Problem List   Diagnosis Date Noted   Osteoarthritis 01/29/2021   Vertigo 09/26/2018   Cluster headaches 09/26/2018   Dry mouth 09/26/2018   Type 2 diabetes mellitus with stage 3 chronic kidney disease, without long-term current use of insulin (HCC) 03/30/2018   CKD (chronic kidney disease) stage 3, GFR 30-59 ml/min (HCC) 03/30/2018   Hypertension associated with diabetes (HCC) 03/30/2018   Glaucoma 03/30/2018   Chronic pain of both shoulders 03/30/2018   Allergic rhinitis 03/30/2018   Anemia 03/30/2018    REFERRING DIAG: M25.561,M25.562,G89.29 (ICD-10-CM) - Chronic pain of both knees M25.571,G89.29,M25.572 (ICD-10-CM) - Chronic pain of both ankles   THERAPY DIAG:  Chronic  pain of right knee  Chronic pain of left knee  Muscle weakness (generalized)  Difficulty walking  PERTINENT HISTORY: Stage 3 CKD, diabetes, lumbar pain, neuropathy, glaucoma, diabetic retinopathy  PRECAUTIONS: knee  SUBJECTIVE: "Not hurting really today, "I have a reasonable portion of health of strength"  PAIN:  Are you having pain? No NPRS scale: 0-1/10 Worst 10/10 Pain location: bilateral knees Pain orientation: Bilateral  PAIN TYPE: aching, burning, sharp, and tingling Pain description: intermittent  Aggravating factors: walking, down stairs, bending Relieving factors: heat  PRECAUTIONS: Knee  WEIGHT BEARING RESTRICTIONS No  FALLS:  Has patient fallen in last 6 months? Yes, Number of falls: 2- picking something off the floor  LIVING ENVIRONMENT: Lives with: lives with their family Lives in: House/apartment Stairs: Yes;  Has following equipment at home: Single point cane  OCCUPATION: retired  PLOF: Independent with basic ADLs  PATIENT GOALS : pt would like to be able to walk better. She is going on a cruise this year in Oct and wants to walk freely during.    OBJECTIVE:   DIAGNOSTIC FINDINGS:  L knee FINDINGS: No acute fracture or dislocation is noted. Mild degenerative changes in the patellofemoral joint space are seen. No joint effusion is noted.  IMPRESSION: Mild degenerative change without acute abnormality.    R knee FINDINGS: No acute fracture or dislocation is noted. No soft tissue abnormality is seen.   IMPRESSION: No acute abnormality noted.  PATIENT SURVEYS:  FOTO unable due to time   Foto completed 04/22/21: 33  COGNITION:  Overall cognitive status: Within functional limits for tasks assessed     SENSATION:  Light touch: Deficits neuropathy in legs     POSTURE:  Kyphotic, decreased lumbar lordosis, flex hip gait  PALPATION: TTP R quad and lateral joint line No TTP of L   LE AROM/PROM:  A/PROM Right 03/25/2021  Left 03/25/2021  Hip extension -5 -10  Knee flexion [facility] WFL  Knee extension [facility] WFL  Ankle dorsiflexion -10 -10   (Blank rows = not tested)  LE MMT:  MMT Right 03/25/2021 Left 03/25/2021  Knee flexion 4+/5 4+/5  Knee extension 4/5 4/5   (Blank rows = not tested)  LOWER EXTREMITY SPECIAL TESTS:  Knee special tests: Anterior drawer test: negative and Varus/Valgus   FUNCTIONAL TESTS:  5 times sit to stand: unable to perform without hands, pain with stand TUG: 26s (difficulty with transfer)    GAIT: Distance walked: 58ft Assistive device utilized: None Level of assistance: SBA Comments: decreased step length, antalgic, decreased L hip ROM, decreased heel strike     TODAY'S TREATMENT: Pt seen for aquatic therapy today.  Treatment took place in water 3.25-4.8 ft in depth at the [facility] pool. Temp of water was 91.  Pt entered/exited the pool via stairs (step through pattern) independently with bilat rail. Foto completed  Reviewed current function, pain levels, response to prior Rx, and HEP compliance.    Seated: stretching, gastroc, hamstring 3 x 20 sec -SLR/flutter kicking 3x30 -add/abd 3x30    Standing: instruction on TrA bracing throughout exercises for core engagement and strengthening -Standing kick board pushdown 3x12 -Lumbar rotations x20 -Hip hinges 2 x 10  -LB stretching in above position 3 x 20 second hold  Balance Tandem and semi tandem positioning PF 2x 10 supported by hand buoys DF x 10 holding to wall, then x 10 supported by hand buoys.   Pt requires buoyancy for support and to offload joints with strengthening exercises. Viscosity of the water is needed for resistance of strengthening; water current perturbations provides challenge to standing balance unsupported, requiring increased core activation.        PATIENT EDUCATION:  Education details: MOI, diagnosis, prognosis, anatomy, exercise progression, DOMS expectations, muscle  firing,  envelope of function, HEP, POC  Person educated: Patient Education method: Explanation, Demonstration, Tactile cues, Verbal cues, and Handouts Education comprehension: verbalized understanding, returned demonstration, verbal cues required, and tactile cues required   HOME EXERCISE PROGRAM: Access Code: [email or web address] URL: [email or web address] Date: 03/25/2021 Prepared by: Daleen Bo Exercises Sit to Stand with Armchair - 2 x daily - 7 x weekly - 1 sets - 10 reps Sitting Knee Extension with Resistance - 1 x daily - 7 x weekly - 3 sets - 10 reps  ASSESSMENT:  CLINICAL IMPRESSION: Pt with some increase in LB pain after last aquatics treatment. Reports she "just stayed in bed"  Pt instructed on importance of movement to decrease pain and improve function. Progressed static balancing, pt with difficulty completing.  Unable to maintain balance with tandem or semi-tandem supported by hand buoys. Cues for abdominal bracing improved balance ability with DF and PF using the same support.  Pain has decreased overall. Gait wfl in submerged.Pt reporting improved ability  to walk longer distances, 500 ft today from parking lot with increased sx. Pt will continue to benefit from aquatic therapy to improve strengthening, amb and functional mobility    Objective impairments include Abnormal gait, decreased activity tolerance, decreased balance, decreased endurance, decreased mobility, difficulty walking, decreased ROM, decreased strength, hypomobility, increased muscle spasms, impaired flexibility, improper body mechanics, postural dysfunction, and pain. These impairments are limiting patient from cleaning, community activity, driving, meal prep, laundry, yard work, shopping, and exercise/recreation. Personal factors including Age, Fitness, Time since onset of injury/illness/exacerbation, and 1-2 comorbidities:  are also affecting patient's functional outcome. Patient will benefit from skilled  PT to address above impairments and improve overall function.  REHAB POTENTIAL: Fair   CLINICAL DECISION MAKING: Evolving/moderate complexity  EVALUATION COMPLEXITY: Moderate   GOALS:   SHORT TERM GOALS:  STG Name Target Date Goal status  1 Pt will become independent with HEP in order to demonstrate synthesis of PT education.  Baseline:  05/06/2021 INITIAL  2 Pt will report at least 2 pt reduction on NPRS scale for pain in order to demonstrate functional improvement with household activity, self care, and ADL.  Baseline:  05/06/2021 INITIAL  4 Pt will be able to demonstrate STS without UE support in order to demonstrate functional improvement in LE function for self-care and house hold duties.  Baseline: 05/06/2021 INITIAL   LONG TERM GOALS:   LTG Name Target Date Goal status  1 Pt  will become independent with final HEP in order to demonstrate synthesis of PT education.  Baseline: 06/17/2021 INITIAL  2 Pt will be able to demonstrate/report ability to walk >10 mins without pain in order to demonstrate functional improvement and tolerance to exercise and community mobility.   Baseline: 06/17/2021 INITIAL  3 Pt will be able to perform 5XSTS in under 12s  in order to demonstrate functional improvement above the cut off score for adults.  Baseline: 06/17/2021 INITIAL  4 Pt will be able to demonstrate TUG in under 10 sec in order to demonstrate functional improvement in LE function, strength, balance, and mobility for safety with community ambulation.  06/17/2021 INITIAL   PLAN: PT FREQUENCY: 1-2x/week  PT DURATION: 12 weeks   PLANNED INTERVENTIONS: Therapeutic exercises, Therapeutic activity, Neuro Muscular re-education, Balance training, Gait training, Patient/Family education, Joint mobilization, Stair training, Aquatic Therapy, Dry Needling, Electrical stimulation, Spinal mobilization, Cryotherapy, Moist heat, Manual lymph drainage, Compression bandaging, Taping, Vasopneumatic  device, Traction, Ultrasound, Ionotophoresis 4mg /ml Dexamethasone, and Manual therapy  PLAN FOR NEXT SESSION:  quad strength and balance; reinforce usage of cane when not in home setting; L/S stretching to improve L LE ROM during     Stanton Kidney Tharon Aquas) Catrina Fellenz MPT 04/29/2021, 1:24 PM

## 2021-05-06 ENCOUNTER — Encounter (HOSPITAL_BASED_OUTPATIENT_CLINIC_OR_DEPARTMENT_OTHER): Payer: Self-pay | Admitting: Physical Therapy

## 2021-05-06 ENCOUNTER — Other Ambulatory Visit: Payer: Self-pay

## 2021-05-06 ENCOUNTER — Ambulatory Visit (HOSPITAL_COMMUNITY)
Admission: RE | Admit: 2021-05-06 | Discharge: 2021-05-06 | Disposition: A | Discharge status: Home / Self Care | Payer: Medicare Other | Source: Ambulatory Visit | Attending: Nephrology | Admitting: Nephrology

## 2021-05-06 ENCOUNTER — Ambulatory Visit (HOSPITAL_BASED_OUTPATIENT_CLINIC_OR_DEPARTMENT_OTHER): Payer: Medicare Other | Admitting: Physical Therapy

## 2021-05-06 VITALS — BP 121/61 | HR 60 | Temp 97.0°F | Resp 16

## 2021-05-06 DIAGNOSIS — R262 Difficulty in walking, not elsewhere classified: Secondary | ICD-10-CM

## 2021-05-06 DIAGNOSIS — M25561 Pain in right knee: Secondary | ICD-10-CM

## 2021-05-06 DIAGNOSIS — D631 Anemia in chronic kidney disease: Secondary | ICD-10-CM | POA: Insufficient documentation

## 2021-05-06 DIAGNOSIS — M25562 Pain in left knee: Secondary | ICD-10-CM

## 2021-05-06 DIAGNOSIS — N183 Chronic kidney disease, stage 3 unspecified: Secondary | ICD-10-CM | POA: Diagnosis not present

## 2021-05-06 DIAGNOSIS — G8929 Other chronic pain: Secondary | ICD-10-CM

## 2021-05-06 DIAGNOSIS — M6281 Muscle weakness (generalized): Secondary | ICD-10-CM

## 2021-05-06 LAB — IRON AND TIBC
Iron: 91 ug/dL (ref 28–170)
Saturation Ratios: 28 % (ref 10.4–31.8)
TIBC: 325 ug/dL (ref 250–450)
UIBC: 234 ug/dL

## 2021-05-06 LAB — FERRITIN: Ferritin: 519 ng/mL — ABNORMAL HIGH (ref 11–307)

## 2021-05-06 LAB — POCT HEMOGLOBIN-HEMACUE: Hemoglobin: 11.3 g/dL — ABNORMAL LOW (ref 12.0–15.0)

## 2021-05-06 MED ORDER — EPOETIN ALFA-EPBX 10000 UNIT/ML IJ SOLN
INTRAMUSCULAR | Status: AC
  Administered 2021-05-06: 15000 [IU] via SUBCUTANEOUS
  Filled 2021-05-06: qty 2

## 2021-05-06 MED ORDER — EPOETIN ALFA-EPBX 10000 UNIT/ML IJ SOLN: 15000.0000 [IU] | INTRAMUSCULAR | Status: DC

## 2021-05-06 NOTE — Therapy (Signed)
OUTPATIENT PHYSICAL THERAPY TREATMENT NOTE   Patient Name: Brandi Cooper MRN: [id number] DOB:[birth year], 76 y.o., female Today's Date: 05/06/2021  PCP: Vivi Barrack, MD REFERRING PROVIDER: Vivi Barrack, MD   PT End of Session - 05/06/21 1116     Visit Number 4    Number of Visits 21    Date for PT Re-Evaluation 06/23/21    Authorization Type BCBS Medicare    PT Start Time 1115    PT Stop Time 1200    PT Time Calculation (min) 45 min    Activity Tolerance Patient tolerated treatment well    Behavior During Therapy WFL for tasks assessed/performed             Past Medical History:  Diagnosis Date   Chronic kidney disease    Diabetes mellitus without complication ([place])    GERD (gastroesophageal reflux disease)    Glaucoma    Heart disease    Past Surgical History:  Procedure Laterality Date   ABDOMINAL HYSTERECTOMY     blood transfusion     BREAST SURGERY     Breast Reduction   CERVICAL FUSION     EYE SURGERY     KNEE CARTILAGE SURGERY     LUMBAR [place] SURGERY     REDUCTION MAMMAPLASTY     RETINOPATHY SURGERY     SHOULDER SURGERY Bilateral    Rotator Cuff   TRABECULECTOMY     TUBAL LIGATION     VITRECTOMY AND CATARACT     WRIST SURGERY     Carpal Tunnel   Patient Active Problem List   Diagnosis Date Noted   Osteoarthritis 01/29/2021   Vertigo 09/26/2018   Cluster headaches 09/26/2018   Dry mouth 09/26/2018   Type 2 diabetes mellitus with stage 3 chronic kidney disease, without long-term current use of insulin ([place]) 03/30/2018   CKD (chronic kidney disease) stage 3, GFR 30-59 ml/min ([place]) 03/30/2018   Hypertension associated with diabetes ([place]) 03/30/2018   Glaucoma 03/30/2018   Chronic pain of both shoulders 03/30/2018   Allergic rhinitis 03/30/2018   Anemia 03/30/2018    REFERRING DIAG: M25.561,M25.562,G89.29 (ICD-10-CM) - Chronic pain of both knees M25.571,G89.29,M25.572 (ICD-10-CM) - Chronic pain of both ankles   THERAPY DIAG:  Chronic  pain of right knee  Chronic pain of left knee  Muscle weakness (generalized)  Difficulty walking  PERTINENT HISTORY: Stage 3 CKD, diabetes, lumbar pain, neuropathy, glaucoma, diabetic retinopathy  PRECAUTIONS: knee  SUBJECTIVE: "Didn't hurt after last visit like I did after the first"    PAIN:  Are you having pain? No NPRS scale: 0-1/10 Worst 10/10 Pain location: bilateral knees Pain orientation: Bilateral  PAIN TYPE: aching, burning, sharp, and tingling Pain description: intermittent  Aggravating factors: walking, down stairs, bending Relieving factors: heat  PRECAUTIONS: Knee  WEIGHT BEARING RESTRICTIONS No  FALLS:  Has patient fallen in last 6 months? Yes, Number of falls: 2- picking something off the floor  LIVING ENVIRONMENT: Lives with: lives with their family Lives in: House/apartment Stairs: Yes;  Has following equipment at home: Single point cane  OCCUPATION: retired  PLOF: Independent with basic ADLs  PATIENT GOALS : pt would like to be able to walk better. She is going on a cruise this year in Oct and wants to walk freely during.    OBJECTIVE:   DIAGNOSTIC FINDINGS:  L knee FINDINGS: No acute fracture or dislocation is noted. Mild degenerative changes in the patellofemoral joint space are seen. No joint effusion is noted.  IMPRESSION: Mild degenerative change without acute abnormality.    R knee FINDINGS: No acute fracture or dislocation is noted. No soft tissue abnormality is seen.   IMPRESSION: No acute abnormality noted.  PATIENT SURVEYS:  FOTO unable due to time   Foto completed 04/22/21: 33  COGNITION:  Overall cognitive status: Within functional limits for tasks assessed     SENSATION:  Light touch: Deficits neuropathy in legs     POSTURE:  Kyphotic, decreased lumbar lordosis, flex hip gait  PALPATION: TTP R quad and lateral joint line No TTP of L   LE AROM/PROM:  A/PROM Right 03/25/2021 Left 03/25/2021  Hip extension  -5 -10  Knee flexion [facility] WFL  Knee extension [facility] WFL  Ankle dorsiflexion -10 -10   (Blank rows = not tested)  LE MMT:  MMT Right 03/25/2021 Left 03/25/2021  Knee flexion 4+/5 4+/5  Knee extension 4/5 4/5   (Blank rows = not tested)  LOWER EXTREMITY SPECIAL TESTS:  Knee special tests: Anterior drawer test: negative and Varus/Valgus   FUNCTIONAL TESTS:  5 times sit to stand: unable to perform without hands, pain with stand TUG: 26s (difficulty with transfer)    GAIT: Distance walked: 53ft Assistive device utilized: None Level of assistance: SBA Comments: decreased step length, antalgic, decreased L hip ROM, decreased heel strike     TODAY'S TREATMENT: Pt seen for aquatic therapy today.  Treatment took place in water 3.25-4.8 ft in depth at the [facility] pool. Temp of water was 91.  Pt entered/exited the pool via stairs (step through pattern) independently with bilat rail. Foto completed  Reviewed current function, pain levels, response to prior Rx, and HEP compliance.    Seated: stretching, gastroc, hamstring 3 x 20 sec -SLR/flutter kicking 3x30 -add/abd 3x30 -alphabet outlining ankles R/L    Standing Stretching and strengthening: Cues for TrA bracing. -Standing kick board pushdown 3x12 -Lumbar rotations x20 -Hip hinges 2 x 10  -LB stretching in above position 3 x 20 second hold -step ups forward,and side stepping submerged 80% x10 R/L.  Progressed to 3 ft completes forward x10 R/L  Balance Tandem and semi tandem positioning PF 2x 10 supported by hand buoys DF x 10 holding to wall, then x 10 supported by hand buoys.   Pt requires buoyancy for support and to offload joints with strengthening exercises. Viscosity of the water is needed for resistance of strengthening; water current perturbations provides challenge to standing balance unsupported, requiring increased core activation.        PATIENT EDUCATION:  Education details: MOI, diagnosis,  prognosis, anatomy, exercise progression, DOMS expectations, muscle firing,  envelope of function, HEP, POC  Person educated: Patient Education method: Explanation, Demonstration, Tactile cues, Verbal cues, and Handouts Education comprehension: verbalized understanding, returned demonstration, verbal cues required, and tactile cues required   HOME EXERCISE PROGRAM: Access Code: [email or web address] URL: [email or web address] Date: 03/25/2021 Prepared by: Daleen Bo Exercises Sit to Stand with Armchair - 2 x daily - 7 x weekly - 1 sets - 10 reps Sitting Knee Extension with Resistance - 1 x daily - 7 x weekly - 3 sets - 10 reps  ASSESSMENT:  CLINICAL IMPRESSION: Pt with complaints of bilateral ankle discomfort today.  Added alphabet outlining completed today in pool and she is instructed to complete at home. Pt progressing with toleration to amb as she reports she can walk for about 30 minutes if she has her cane without needing to rest at best. Ankle have been limiting her more recently.  She completes all exercises without increase of sx today. Balance improving with completion of all exercises in standing holding to hand buoys.   Pt with some increase in LB pain after last aquatics treatment. Reports she "just stayed in bed"  Pt instructed on importance of movement to decrease pain and improve function. Progressed static balancing, pt with difficulty completing.  Unable to maintain balance with tandem or semi-tandem supported by hand buoys. Cues for abdominal bracing improved balance ability with DF and PF using the same support.  Pain has decreased overall. Gait wfl in submerged.Pt reporting improved ability to walk longer distances, 500 ft today from parking lot with increased sx. Pt will continue to benefit from aquatic therapy to improve strengthening, amb and functional mobility    Objective impairments include Abnormal gait, decreased activity tolerance, decreased balance, decreased  endurance, decreased mobility, difficulty walking, decreased ROM, decreased strength, hypomobility, increased muscle spasms, impaired flexibility, improper body mechanics, postural dysfunction, and pain. These impairments are limiting patient from cleaning, community activity, driving, meal prep, laundry, yard work, shopping, and exercise/recreation. Personal factors including Age, Fitness, Time since onset of injury/illness/exacerbation, and 1-2 comorbidities:  are also affecting patient's functional outcome. Patient will benefit from skilled PT to address above impairments and improve overall function.  REHAB POTENTIAL: Fair   CLINICAL DECISION MAKING: Evolving/moderate complexity  EVALUATION COMPLEXITY: Moderate   GOALS:   SHORT TERM GOALS:  STG Name Target Date Goal status  1 Pt will become independent with HEP in order to demonstrate synthesis of PT education.  Baseline:  05/06/2021 INITIAL  2 Pt will report at least 2 pt reduction on NPRS scale for pain in order to demonstrate functional improvement with household activity, self care, and ADL.  Baseline:  05/06/2021 INITIAL  4 Pt will be able to demonstrate STS without UE support in order to demonstrate functional improvement in LE function for self-care and house hold duties.  Baseline: 05/06/2021 INITIAL   LONG TERM GOALS:   LTG Name Target Date Goal status  1 Pt  will become independent with final HEP in order to demonstrate synthesis of PT education.  Baseline: 06/17/2021 INITIAL  2 Pt will be able to demonstrate/report ability to walk >10 mins without pain in order to demonstrate functional improvement and tolerance to exercise and community mobility.   Baseline: 06/17/2021 INITIAL  3 Pt will be able to perform 5XSTS in under 12s  in order to demonstrate functional improvement above the cut off score for adults.  Baseline: 06/17/2021 INITIAL  4 Pt will be able to demonstrate TUG in under 10 sec in order to demonstrate  functional improvement in LE function, strength, balance, and mobility for safety with community ambulation.  06/17/2021 INITIAL   PLAN: PT FREQUENCY: 1-2x/week  PT DURATION: 12 weeks   PLANNED INTERVENTIONS: Therapeutic exercises, Therapeutic activity, Neuro Muscular re-education, Balance training, Gait training, Patient/Family education, Joint mobilization, Stair training, Aquatic Therapy, Dry Needling, Electrical stimulation, Spinal mobilization, Cryotherapy, Moist heat, Manual lymph drainage, Compression bandaging, Taping, Vasopneumatic device, Traction, Ultrasound, Ionotophoresis 4mg /ml Dexamethasone, and Manual therapy  PLAN FOR NEXT SESSION:  quad strength and balance; reinforce usage of cane when not in home setting; L/S stretching to improve L LE ROM      Roni Friberg (Frankie) Fatin Bachicha MPT 05/06/2021, 1:33 PM

## 2021-05-13 ENCOUNTER — Other Ambulatory Visit: Payer: Self-pay

## 2021-05-13 ENCOUNTER — Ambulatory Visit (HOSPITAL_BASED_OUTPATIENT_CLINIC_OR_DEPARTMENT_OTHER): Payer: Medicare Other | Attending: Family Medicine | Admitting: Physical Therapy

## 2021-05-13 ENCOUNTER — Encounter (HOSPITAL_BASED_OUTPATIENT_CLINIC_OR_DEPARTMENT_OTHER): Payer: Self-pay | Admitting: Physical Therapy

## 2021-05-13 DIAGNOSIS — G8929 Other chronic pain: Secondary | ICD-10-CM | POA: Diagnosis present

## 2021-05-13 DIAGNOSIS — M6281 Muscle weakness (generalized): Secondary | ICD-10-CM | POA: Diagnosis present

## 2021-05-13 DIAGNOSIS — R262 Difficulty in walking, not elsewhere classified: Secondary | ICD-10-CM | POA: Insufficient documentation

## 2021-05-13 DIAGNOSIS — M25562 Pain in left knee: Secondary | ICD-10-CM | POA: Insufficient documentation

## 2021-05-13 DIAGNOSIS — M25561 Pain in right knee: Secondary | ICD-10-CM

## 2021-05-13 NOTE — Therapy (Signed)
OUTPATIENT PHYSICAL THERAPY TREATMENT NOTE   Patient Name: Beebe Counce MRN: [id number] DOB:1945-12-09, 76 y.o., female Today's Date: 05/13/2021  PCP: Vivi Barrack, MD REFERRING PROVIDER: Vivi Barrack, MD   PT End of Session - 05/13/21 1416     Visit Number 5    Number of Visits 21    Date for PT Re-Evaluation 06/23/21    Authorization Type BCBS Medicare    PT Start Time 1415    PT Stop Time 1500    PT Time Calculation (min) 45 min    Activity Tolerance Patient tolerated treatment well    Behavior During Therapy WFL for tasks assessed/performed             Past Medical History:  Diagnosis Date   Chronic kidney disease    Diabetes mellitus without complication ([place])    GERD (gastroesophageal reflux disease)    Glaucoma    Heart disease    Past Surgical History:  Procedure Laterality Date   ABDOMINAL HYSTERECTOMY     blood transfusion     BREAST SURGERY     Breast Reduction   CERVICAL FUSION     EYE SURGERY     KNEE CARTILAGE SURGERY     LUMBAR [place] Bilateral    Rotator Cuff   TRABECULECTOMY     TUBAL LIGATION     VITRECTOMY AND CATARACT     WRIST SURGERY     Carpal Tunnel   Patient Active Problem List   Diagnosis Date Noted   Osteoarthritis 01/29/2021   Vertigo 09/26/2018   Cluster headaches 09/26/2018   Dry mouth 09/26/2018   Type 2 diabetes mellitus with stage 3 chronic kidney disease, without long-term current use of insulin ([place]) 03/30/2018   CKD (chronic kidney disease) stage 3, GFR 30-59 ml/min ([place]) 03/30/2018   Hypertension associated with diabetes ([place]) 03/30/2018   Glaucoma 03/30/2018   Chronic pain of both shoulders 03/30/2018   Allergic rhinitis 03/30/2018   Anemia 03/30/2018    REFERRING DIAG: M25.561,M25.562,G89.29 (ICD-10-CM) - Chronic pain of both knees M25.571,G89.29,M25.572 (ICD-10-CM) - Chronic pain of both ankles   THERAPY DIAG:  Chronic  pain of right knee  Chronic pain of left knee  Muscle weakness (generalized)  Difficulty walking  PERTINENT HISTORY: Stage 3 CKD, diabetes, lumbar pain, neuropathy, glaucoma, diabetic retinopathy  PRECAUTIONS: knee  SUBJECTIVE: "Hurt my back lifting my husband when taking him to [place] LBP 8/10"    PAIN:  Are you having pain? Yes NPRS scale: Current 5/10 Worst 10/10 Pain location: bilateral knees Pain orientation: Bilateral  PAIN TYPE: aching, burning, sharp, and tingling Pain description: intermittent  Aggravating factors: walking, down stairs, bending Relieving factors: heat  PRECAUTIONS: Knee  WEIGHT BEARING RESTRICTIONS No  FALLS:  Has patient fallen in last 6 months? Yes, Number of falls: 2- picking something off the floor  LIVING ENVIRONMENT: Lives with: lives with their family Lives in: House/apartment Stairs: Yes;  Has following equipment at home: Single point cane  OCCUPATION: retired  PLOF: Independent with basic ADLs  PATIENT GOALS : pt would like to be able to walk better. She is going on a cruise this year in Oct and wants to walk freely during.    OBJECTIVE:   DIAGNOSTIC FINDINGS:  L knee FINDINGS: No acute fracture or dislocation is noted. Mild degenerative changes in the patellofemoral joint space are seen. No joint effusion is  noted.   IMPRESSION: Mild degenerative change without acute abnormality.    R knee FINDINGS: No acute fracture or dislocation is noted. No soft tissue abnormality is seen.   IMPRESSION: No acute abnormality noted.  PATIENT SURVEYS:  FOTO unable due to time   Foto completed 04/22/21: 33  COGNITION:  Overall cognitive status: Within functional limits for tasks assessed     SENSATION:  Light touch: Deficits neuropathy in legs     POSTURE:  Kyphotic, decreased lumbar lordosis, flex hip gait  PALPATION: TTP R quad and lateral joint line No TTP of L   LE AROM/PROM:  A/PROM Right 03/25/2021 Left 03/25/2021   Hip extension -5 -10  Knee flexion [facility] WFL  Knee extension [facility] WFL  Ankle dorsiflexion -10 -10   (Blank rows = not tested)  LE MMT:  MMT Right 03/25/2021 Left 03/25/2021  Knee flexion 4+/5 4+/5  Knee extension 4/5 4/5   (Blank rows = not tested)  LOWER EXTREMITY SPECIAL TESTS:  Knee special tests: Anterior drawer test: negative and Varus/Valgus   FUNCTIONAL TESTS:  5 times sit to stand: unable to perform without hands, pain with stand TUG: 26s (difficulty with transfer)    GAIT: Distance walked: 96ft Assistive device utilized: None Level of assistance: SBA Comments: decreased step length, antalgic, decreased L hip ROM, decreased heel strike     TODAY'S TREATMENT: Pt seen for aquatic therapy today.  Treatment took place in water 3.25-4.8 ft in depth at the [facility] pool. Temp of water was 91.  Pt entered/exited the pool via stairs (step through pattern) independently with bilat rail.   Reviewed current function, pain levels, response to prior Rx, and HEP compliance.   Amb with hand buoys in 4.63ft x 10 minutes forward, back and side stepping  Seated STS submerged to 3 step (from bottom) x 10.  VC and demonstration for technique   Standing Stretching and strengthening: Cues for TrA bracing. Runners stretch hamstring and gastroc 3 x 30s -Standing kick board pushdown 2x15 -Lumbar rotations x20 -Hip hinges 2 x 10 -hip circles 2x10 clockwise and counter clockwise R/L -kick board push pull small BOS 2x15     Balance  PF 2x 10 supported by hand buoys DF 2x 10 supported by hand buoys. Tandem stance R/L using hand buoys. Pt with mod instability with completion losing balance x 2 when leading with rle. No LOB with left   Pt requires buoyancy for support and to offload joints with strengthening exercises. Viscosity of the water is needed for resistance of strengthening; water current perturbations provides challenge to standing balance unsupported, requiring  increased core activation.        PATIENT EDUCATION:  Education details: MOI, diagnosis, prognosis, anatomy, exercise progression, DOMS expectations, muscle firing,  envelope of function, HEP, POC  Person educated: Patient Education method: Explanation, Demonstration, Tactile cues, Verbal cues, and Handouts Education comprehension: verbalized understanding, returned demonstration, verbal cues required, and tactile cues required   HOME EXERCISE PROGRAM: Access Code: [email or web address] URL: [email or web address] Date: 03/25/2021 Prepared by: Daleen Bo Exercises Sit to Stand with Armchair - 2 x daily - 7 x weekly - 1 sets - 10 reps Sitting Knee Extension with Resistance - 1 x daily - 7 x weekly - 3 sets - 10 reps  ASSESSMENT:  CLINICAL IMPRESSION: Pt reports back strain moving husband earlier this week.  Her gait is increasingly antalgic today and she is having difficulty standing from chair.  Focus today on increasing movement of LB and hips  to improve ROM, decrease stiffness and reduce discomfort.  Pt with increased tightness in hamstring as was unable to gain adequate stretch in LB for hip hinge or jack knife position on wall, only hamstring stretch . Improving standing balance as evidenced by decreased ue support needed with standing exercises. Pt reports compliance with HEP.    Objective impairments include Abnormal gait, decreased activity tolerance, decreased balance, decreased endurance, decreased mobility, difficulty walking, decreased ROM, decreased strength, hypomobility, increased muscle spasms, impaired flexibility, improper body mechanics, postural dysfunction, and pain. These impairments are limiting patient from cleaning, community activity, driving, meal prep, laundry, yard work, shopping, and exercise/recreation. Personal factors including Age, Fitness, Time since onset of injury/illness/exacerbation, and 1-2 comorbidities:  are also affecting patient's functional  outcome. Patient will benefit from skilled PT to address above impairments and improve overall function.  REHAB POTENTIAL: Fair   CLINICAL DECISION MAKING: Evolving/moderate complexity  EVALUATION COMPLEXITY: Moderate   GOALS:   SHORT TERM GOALS:  STG Name Target Date Goal status  1 Pt will become independent with HEP in order to demonstrate synthesis of PT education.  Baseline:  05/06/2021 05/13/21 INITIAL Achieved  2 Pt will report at least 2 pt reduction on NPRS scale for pain in order to demonstrate functional improvement with household activity, self care, and ADL.  Baseline:  05/06/2021 INITIAL  4 Pt will be able to demonstrate STS without UE support in order to demonstrate functional improvement in LE function for self-care and house hold duties.  Baseline: 05/06/2021 INITIAL   LONG TERM GOALS:   LTG Name Target Date Goal status  1 Pt  will become independent with final HEP in order to demonstrate synthesis of PT education.  Baseline: 06/17/2021 INITIAL  2 Pt will be able to demonstrate/report ability to walk >10 mins without pain in order to demonstrate functional improvement and tolerance to exercise and community mobility.   Baseline: 06/17/2021 INITIAL  3 Pt will be able to perform 5XSTS in under 12s  in order to demonstrate functional improvement above the cut off score for adults.  Baseline: 06/17/2021 INITIAL  4 Pt will be able to demonstrate TUG in under 10 sec in order to demonstrate functional improvement in LE function, strength, balance, and mobility for safety with community ambulation.  06/17/2021 INITIAL   PLAN: PT FREQUENCY: 1-2x/week  PT DURATION: 12 weeks   PLANNED INTERVENTIONS: Therapeutic exercises, Therapeutic activity, Neuro Muscular re-education, Balance training, Gait training, Patient/Family education, Joint mobilization, Stair training, Aquatic Therapy, Dry Needling, Electrical stimulation, Spinal mobilization, Cryotherapy, Moist heat, Manual lymph  drainage, Compression bandaging, Taping, Vasopneumatic device, Traction, Ultrasound, Ionotophoresis 4mg /ml Dexamethasone, and Manual therapy  PLAN FOR NEXT SESSION:  quad strength and balance; reinforce usage of cane when not in home setting; L/S stretching to improve L LE ROM      Particia Strahm (Frankie) Ladora Osterberg MPT 05/13/2021, 3:09 PM

## 2021-05-25 ENCOUNTER — Other Ambulatory Visit: Payer: Self-pay | Admitting: Family Medicine

## 2021-05-27 ENCOUNTER — Other Ambulatory Visit: Payer: Self-pay

## 2021-05-27 ENCOUNTER — Ambulatory Visit (HOSPITAL_BASED_OUTPATIENT_CLINIC_OR_DEPARTMENT_OTHER): Payer: Medicare Other | Admitting: Physical Therapy

## 2021-05-27 DIAGNOSIS — R262 Difficulty in walking, not elsewhere classified: Secondary | ICD-10-CM

## 2021-05-27 DIAGNOSIS — G8929 Other chronic pain: Secondary | ICD-10-CM

## 2021-05-27 DIAGNOSIS — M6281 Muscle weakness (generalized): Secondary | ICD-10-CM

## 2021-05-27 DIAGNOSIS — M25561 Pain in right knee: Secondary | ICD-10-CM | POA: Diagnosis not present

## 2021-05-27 NOTE — Therapy (Signed)
?OUTPATIENT PHYSICAL THERAPY TREATMENT NOTE ? ? ?Patient Name: Brandi Cooper ?MRN: [id number] ?DOB:May 27, 1945, 76 y.o., female ?Today's Date: 05/27/2021 ? ?PCP: Vivi Barrack, MD ?REFERRING PROVIDER: Vivi Barrack, MD ? ? PT End of Session - 05/27/21 0600   ? ? Visit Number 6   ? Number of Visits 21   ? Date for PT Re-Evaluation 06/23/21   ? Authorization Type BCBS Medicare   ? PT Start Time 1129   ? PT Stop Time 1210   ? PT Time Calculation (min) 41 min   ? Activity Tolerance Patient tolerated treatment well   ? Behavior During Therapy [facility] for tasks assessed/performed   ? ?  ?  ? ?  ? ? ?Past Medical History:  ?Diagnosis Date  ? Chronic kidney disease   ? Diabetes mellitus without complication ([place])   ? GERD (gastroesophageal reflux disease)   ? Glaucoma   ? Heart disease   ? ?Past Surgical History:  ?Procedure Laterality Date  ? ABDOMINAL HYSTERECTOMY    ? blood transfusion    ? BREAST SURGERY    ? Breast Reduction  ? CERVICAL FUSION    ? EYE SURGERY    ? KNEE CARTILAGE SURGERY    ? LUMBAR DISC SURGERY    ? REDUCTION MAMMAPLASTY    ? RETINOPATHY SURGERY    ? SHOULDER SURGERY Bilateral   ? Rotator Cuff  ? TRABECULECTOMY    ? TUBAL LIGATION    ? VITRECTOMY AND CATARACT    ? WRIST SURGERY    ? Carpal Tunnel  ? ?Patient Active Problem List  ? Diagnosis Date Noted  ? Osteoarthritis 01/29/2021  ? Vertigo 09/26/2018  ? Cluster headaches 09/26/2018  ? Dry mouth 09/26/2018  ? Type 2 diabetes mellitus with stage 3 chronic kidney disease, without long-term current use of insulin ([place]) 03/30/2018  ? CKD (chronic kidney disease) stage 3, GFR 30-59 ml/min ([facility]) 03/30/2018  ? Hypertension associated with diabetes ([place]) 03/30/2018  ? Glaucoma 03/30/2018  ? Chronic pain of both shoulders 03/30/2018  ? Allergic rhinitis 03/30/2018  ? Anemia 03/30/2018  ? ? ?REFERRING DIAG: M25.561,M25.562,G89.29 (ICD-10-CM) - Chronic pain of both knees M25.571,G89.29,M25.572 (ICD-10-CM) - Chronic pain of both ankles  ? ?THERAPY DIAG:  ?Chronic  pain of right knee ? ?Chronic pain of left knee ? ?Muscle weakness (generalized) ? ?Difficulty walking ? ?PERTINENT HISTORY: Stage 3 CKD, diabetes, lumbar pain, neuropathy, glaucoma, diabetic retinopathy ? ?PRECAUTIONS: knee ? ?SUBJECTIVE: "[place], hurt for 3 days afterward"   ? ?PAIN:  ?Are you having pain? Yes ?NPRS scale: Current 8/10 Worst 10/10 ?Pain location: bilateral knees ?Pain orientation: Bilateral  ?PAIN TYPE: aching, burning, sharp, and tingling ?Pain description: intermittent  ?Aggravating factors: walking, down stairs, bending ?Relieving factors: heat ? ?PRECAUTIONS: Knee ? ?WEIGHT BEARING RESTRICTIONS No ? ?FALLS:  ?Has patient fallen in last 6 months? Yes, Number of falls: 2- picking something off the floor ? ?LIVING ENVIRONMENT: ?Lives with: lives with their family ?Lives in: House/apartment ?Stairs: Yes;  ?Has following equipment at home: Single point cane ? ?OCCUPATION: retired ? ?PLOF: Independent with basic ADLs ? ?PATIENT GOALS : pt would like to be able to walk better. She is going on a cruise this year in Oct and wants to walk freely during.  ? ? ?OBJECTIVE:  ? ?DIAGNOSTIC FINDINGS:  ?L knee FINDINGS: ?No acute fracture or dislocation is noted. Mild degenerative changes ?in the patellofemoral joint space are seen. No  joint effusion is ?noted. ?  ?IMPRESSION: ?Mild degenerative change without acute abnormality. ?  ? ?R knee FINDINGS: ?No acute fracture or dislocation is noted. No soft tissue ?abnormality is seen. ?  ?IMPRESSION: ?No acute abnormality noted. ? ?PATIENT SURVEYS:  ?FOTO unable due to time  ? Foto completed 04/22/21: 33 ?-05/27/21 completed 35 ? ?COGNITION: ? Overall cognitive status: Within functional limits for tasks assessed   ?  ?SENSATION: ? Light touch: Deficits neuropathy in legs  ?  ? ?POSTURE:  ?Kyphotic, decreased lumbar lordosis, flex hip gait ? ?PALPATION: ?TTP R quad and lateral joint line ?No TTP of L  ? ?LE  AROM/PROM: ? ?A/PROM Right ?03/25/2021 Left ?03/25/2021  ?Hip extension -5 -10  ?Knee flexion [facility] WFL  ?Knee extension [facility] WFL  ?Ankle dorsiflexion -10 -10  ? (Blank rows = not tested) ? ?LE MMT: ? ?MMT Right ?03/25/2021 Left ?03/25/2021  ?Knee flexion 4+/5 4+/5  ?Knee extension 4/5 4/5  ? (Blank rows = not tested) ? ?LOWER EXTREMITY SPECIAL TESTS:  ?Knee special tests: Anterior drawer test: negative and Varus/Valgus  ? ?FUNCTIONAL TESTS:  ?5 times sit to stand: unable to perform without hands, pain with stand ?TUG: 26s (difficulty with transfer)  ? ? ?GAIT: ?Distance walked: 59ft ?Assistive device utilized: None ?Level of assistance: SBA ?Comments: decreased step length, antalgic, decreased L hip ROM, decreased heel strike  ? ? ? ?TODAY'S TREATMENT: ?Pt seen for aquatic therapy today.  Treatment took place in water 3.25-4.8 ft in depth at the [facility] pool. Temp of water was 91?.  Pt entered/exited the pool via stairs (step through pattern) independently with bilat rail. ? ? ?Reviewed current function, pain levels, response to prior Rx, and HEP compliance.   ?Amb with hand buoys in 4.69ft x  forward, back and side stepping x 4 widths ea. ? ?Seated on bench ?Lumbar stretch 5 x 20s ?Cycle:2x20 ?Flutter kicking 2x10 ?  ?Standing Balance and strengthening: Cues for TrA bracing. ?-Hip hinges 2 x 10 ?Ue supported by wall and hand buoy ?-Marching 2 x 10 ?-Hamstring curls 2x10 ?-DF; PF 2x10 ?-Hip extension x10 ? ?Foto complete 35 ? ? ?Pt requires buoyancy for support and to offload joints with strengthening exercises. Viscosity of the water is needed for resistance of strengthening; water current perturbations provides challenge to standing balance unsupported, requiring increased core activation.  ? ? ? ? ? ? ?PATIENT EDUCATION:  ?Education details: MOI, diagnosis, prognosis, anatomy, exercise progression, DOMS expectations, muscle firing,  envelope of function, HEP, POC ? ?Person educated: Patient ?Education  method: Explanation, Demonstration, Tactile cues, Verbal cues, and Handouts ?Education comprehension: verbalized understanding, returned demonstration, verbal cues required, and tactile cues required ? ? ?HOME EXERCISE PROGRAM: ?Access Code: [email or web address] ?URL: [email or web address] ?Date: 03/25/2021 ?Prepared by: Daleen Bo ?Exercises ?Sit to Stand with Armchair - 2 x daily - 7 x weekly - 1 sets - 10 reps ?Sitting Knee Extension with Resistance - 1 x daily - 7 x weekly - 3 sets - 10 reps ? ?ASSESSMENT: ? ?CLINICAL IMPRESSION: ?Pt with increase in pain in knees and LB [place] We discussed management of pain going forward so to improve control and limit exacerbating.. Focus today on gentle movement and strengthening. Foto improved to 35. She reports decrease in sx after session 5/10. She will continue to benefit from skilled PT for progression towards goals. ? ? ? ? ? ?Objective impairments include Abnormal gait, decreased activity tolerance, decreased balance, decreased endurance, decreased mobility, difficulty walking,  decreased ROM, decreased strength, hypomobility, increased muscle spasms, impaired flexibility, improper body mechanics, postural dysfunction, and pain. These impairments are limiting patient from cleaning, community activity, driving, meal prep, laundry, yard work, shopping, and exercise/recreation. Personal factors including Age, Fitness, Time since onset of injury/illness/exacerbation, and 1-2 comorbidities:  are also affecting patient's functional outcome. Patient will benefit from skilled PT to address above impairments and improve overall function. ? ?REHAB POTENTIAL: Fair  ? ?CLINICAL DECISION MAKING: Evolving/moderate complexity ? ?EVALUATION COMPLEXITY: Moderate ? ? ?GOALS: ? ? ?SHORT TERM GOALS: ? ?STG Name Target Date Goal status  ?1 Pt will become independent with HEP in order to demonstrate synthesis of PT education. ? ?Baseline:  05/06/2021 ?05/13/21  INITIAL ?Achieved  ?2 Pt will report at least 2 pt reduction on NPRS scale for pain in order to demonstrate functional improvement with household activity, self care, and ADL. ? ?Baseline:  05/06/2021 INITIAL  ?4 Pt w

## 2021-06-03 ENCOUNTER — Ambulatory Visit (HOSPITAL_COMMUNITY)
Admission: RE | Admit: 2021-06-03 | Discharge: 2021-06-03 | Disposition: A | Discharge status: Home / Self Care | Payer: Medicare Other | Source: Ambulatory Visit | Attending: Nephrology | Admitting: Nephrology

## 2021-06-03 ENCOUNTER — Ambulatory Visit (HOSPITAL_BASED_OUTPATIENT_CLINIC_OR_DEPARTMENT_OTHER): Payer: Medicare Other | Admitting: Physical Therapy

## 2021-06-03 ENCOUNTER — Ambulatory Visit (INDEPENDENT_AMBULATORY_CARE_PROVIDER_SITE_OTHER): Payer: Medicare Other | Admitting: Family Medicine

## 2021-06-03 ENCOUNTER — Encounter (HOSPITAL_BASED_OUTPATIENT_CLINIC_OR_DEPARTMENT_OTHER): Payer: Self-pay | Admitting: Physical Therapy

## 2021-06-03 ENCOUNTER — Other Ambulatory Visit: Payer: Self-pay

## 2021-06-03 VITALS — BP 124/70 | HR 72 | Ht 65.0 in | Wt 185.0 lb

## 2021-06-03 VITALS — BP 118/72 | HR 67 | Temp 97.2°F | Resp 18

## 2021-06-03 DIAGNOSIS — G8929 Other chronic pain: Secondary | ICD-10-CM

## 2021-06-03 DIAGNOSIS — M25561 Pain in right knee: Secondary | ICD-10-CM | POA: Diagnosis not present

## 2021-06-03 DIAGNOSIS — N183 Chronic kidney disease, stage 3 unspecified: Secondary | ICD-10-CM | POA: Diagnosis present

## 2021-06-03 DIAGNOSIS — R262 Difficulty in walking, not elsewhere classified: Secondary | ICD-10-CM

## 2021-06-03 DIAGNOSIS — M25572 Pain in left ankle and joints of left foot: Secondary | ICD-10-CM

## 2021-06-03 DIAGNOSIS — M6281 Muscle weakness (generalized): Secondary | ICD-10-CM

## 2021-06-03 DIAGNOSIS — R609 Edema, unspecified: Secondary | ICD-10-CM

## 2021-06-03 DIAGNOSIS — D631 Anemia in chronic kidney disease: Secondary | ICD-10-CM | POA: Insufficient documentation

## 2021-06-03 LAB — IRON AND TIBC
Iron: 100 ug/dL (ref 28–170)
Saturation Ratios: 32 % — ABNORMAL HIGH (ref 10.4–31.8)
TIBC: 316 ug/dL (ref 250–450)
UIBC: 216 ug/dL

## 2021-06-03 LAB — FERRITIN: Ferritin: 437 ng/mL — ABNORMAL HIGH (ref 11–307)

## 2021-06-03 LAB — POCT HEMOGLOBIN-HEMACUE: Hemoglobin: 10.7 g/dL — ABNORMAL LOW (ref 12.0–15.0)

## 2021-06-03 MED ORDER — EPOETIN ALFA-EPBX 10000 UNIT/ML IJ SOLN
INTRAMUSCULAR | Status: AC
  Filled 2021-06-03: qty 2

## 2021-06-03 MED ORDER — EPOETIN ALFA-EPBX 10000 UNIT/ML IJ SOLN
15000.0000 [IU] | INTRAMUSCULAR | Status: DC
  Administered 2021-06-03: 15000 [IU] via SUBCUTANEOUS

## 2021-06-03 NOTE — Therapy (Signed)
?OUTPATIENT PHYSICAL THERAPY TREATMENT NOTE ? ? ?Patient Name: Brandi Cooper ?MRN: [id number] ?DOB:[birth year], 76 y.o., female ?Today's Date: 06/03/2021 ? ?PCP: Vivi Barrack, MD ?REFERRING PROVIDER: Vivi Barrack, MD ? ? PT End of Session - 06/03/21 1614   ? ? Visit Number 7   ? Number of Visits 21   ? Date for PT Re-Evaluation 06/23/21   ? Authorization Type BCBS Medicare   ? PT Start Time 8270   ? PT Stop Time 7867   ? PT Time Calculation (min) 38 min   ? Activity Tolerance Patient tolerated treatment well   ? Behavior During Therapy [facility] for tasks assessed/performed   ? ?  ?  ? ?  ? ? ?Past Medical History:  ?Diagnosis Date  ? Chronic kidney disease   ? Diabetes mellitus without complication ([place])   ? GERD (gastroesophageal reflux disease)   ? Glaucoma   ? Heart disease   ? ?Past Surgical History:  ?Procedure Laterality Date  ? ABDOMINAL HYSTERECTOMY    ? blood transfusion    ? BREAST SURGERY    ? Breast Reduction  ? CERVICAL FUSION    ? EYE SURGERY    ? KNEE CARTILAGE SURGERY    ? LUMBAR DISC SURGERY    ? REDUCTION MAMMAPLASTY    ? RETINOPATHY SURGERY    ? SHOULDER SURGERY Bilateral   ? Rotator Cuff  ? TRABECULECTOMY    ? TUBAL LIGATION    ? VITRECTOMY AND CATARACT    ? WRIST SURGERY    ? Carpal Tunnel  ? ?Patient Active Problem List  ? Diagnosis Date Noted  ? Osteoarthritis 01/29/2021  ? Vertigo 09/26/2018  ? Cluster headaches 09/26/2018  ? Dry mouth 09/26/2018  ? Type 2 diabetes mellitus with stage 3 chronic kidney disease, without long-term current use of insulin ([place]) 03/30/2018  ? CKD (chronic kidney disease) stage 3, GFR 30-59 ml/min (HCC) 03/30/2018  ? Hypertension associated with diabetes ([place]) 03/30/2018  ? Glaucoma 03/30/2018  ? Chronic pain of both shoulders 03/30/2018  ? Allergic rhinitis 03/30/2018  ? Anemia 03/30/2018  ? ? ?REFERRING DIAG: M25.561,M25.562,G89.29 (ICD-10-CM) - Chronic pain of both knees M25.571,G89.29,M25.572 (ICD-10-CM) - Chronic pain of both ankles  ? ?THERAPY DIAG:  ?Chronic  pain of right knee ? ?Chronic pain of left knee ? ?Muscle weakness (generalized) ? ?Difficulty walking ? ?PERTINENT HISTORY: Stage 3 CKD, diabetes, lumbar pain, neuropathy, glaucoma, diabetic retinopathy ? ?PRECAUTIONS: knee ? ?SUBJECTIVE: "worked out yesterday here and didn't have any pain when I left and for the rest of the night"   ? ?PAIN:  ?Are you having pain? Yes ?NPRS scale: Current 5/10 Worst 6/10 ?Pain location: bilateral knees ?Pain orientation: Bilateral  ?PAIN TYPE: aching, burning, sharp, and tingling ?Pain description: intermittent  ?Aggravating factors: walking, down stairs, bending ?Relieving factors: heat ? ?PRECAUTIONS: Knee ? ?WEIGHT BEARING RESTRICTIONS No ? ?FALLS:  ?Has patient fallen in last 6 months? Yes, Number of falls: 2- picking something off the floor ? ?LIVING ENVIRONMENT: ?Lives with: lives with their family ?Lives in: House/apartment ?Stairs: Yes;  ?Has following equipment at home: Single point cane ? ?OCCUPATION: retired ? ?PLOF: Independent with basic ADLs ? ?PATIENT GOALS : pt would like to be able to walk better. She is going on a cruise this year in Oct and wants to walk freely during.  ? ? ?OBJECTIVE:  ? ?DIAGNOSTIC FINDINGS:  ?L knee FINDINGS: ?No acute fracture or dislocation is noted. Mild degenerative changes ?in the patellofemoral joint space  are seen. No joint effusion is ?noted. ?  ?IMPRESSION: ?Mild degenerative change without acute abnormality. ?  ? ?R knee FINDINGS: ?No acute fracture or dislocation is noted. No soft tissue ?abnormality is seen. ?  ?IMPRESSION: ?No acute abnormality noted. ? ?PATIENT SURVEYS:  ?FOTO unable due to time  ? Foto completed 04/22/21: 33 ?-05/27/21 completed 35 ? ?COGNITION: ? Overall cognitive status: Within functional limits for tasks assessed   ?  ?SENSATION: ? Light touch: Deficits neuropathy in legs  ?  ? ?POSTURE:  ?Kyphotic, decreased lumbar lordosis, flex hip gait ? ?PALPATION: ?TTP R quad and lateral joint line ?No TTP of L  ? ?LE  AROM/PROM: ? ?A/PROM Right ?03/25/2021 Left ?03/25/2021  ?Hip extension -5 -10  ?Knee flexion [facility] WFL  ?Knee extension [facility] WFL  ?Ankle dorsiflexion -10 -10  ? (Blank rows = not tested) ? ?LE MMT: ? ?MMT Right ?03/25/2021 Left ?03/25/2021  ?Knee flexion 4+/5 4+/5  ?Knee extension 4/5 4/5  ? (Blank rows = not tested) ? ?LOWER EXTREMITY SPECIAL TESTS:  ?Knee special tests: Anterior drawer test: negative and Varus/Valgus  ? ?FUNCTIONAL TESTS:  ?5 times sit to stand: unable to perform without hands, pain with stand ?TUG: 26s (difficulty with transfer)  ? ? ?GAIT: ?Distance walked: 66f ?Assistive device utilized: None ?Level of assistance: SBA ?Comments: decreased step length, antalgic, decreased L hip ROM, decreased heel strike  ? ? ? ?TODAY'S TREATMENT: ?Pt seen for aquatic therapy today.  Treatment took place in water 3.25-4.8 ft [facility] Temp of water was 91?.  Pt entered/exited the pool via stairs (step through pattern) independently with bilat rail. ? ? ?Reviewed current function, pain levels, response to prior Rx, and HEP compliance.   ?Amb with hand buoys in 4.797fx  forward, back and side stepping x 4 widths ea. ? ?Seated on bench ?STS from 3rd (bottom) step x10 ?-4th steps x10 ?Flutter kicking 2x25 ?  ?Standing Balance and strengthening: Cues for TrA bracing. ?-lumbar stretch 5 x20s hold ?-Hip hinges x10 ?-kick board push down small BOS core tightened 2x15 ?-kick board push pull 2x15 small BOS core tightened ?-step up unilateral ue support R/L x10 fwd ? ? ? ?Pt requires buoyancy for support and to offload joints with strengthening exercises. Viscosity of the water is needed for resistance of strengthening; water current perturbations provides challenge to standing balance unsupported, requiring increased core activation.  ? ? ? ? ? ? ?PATIENT EDUCATION:  ?Education details: MOI, diagnosis, prognosis, anatomy, exercise progression, DOMS expectations, muscle firing,  envelope of  function, HEP, POC ? ?Person educated: Patient ?Education method: Explanation, Demonstration, Tactile cues, Verbal cues, and Handouts ?Education comprehension: verbalized understanding, returned demonstration, verbal cues required, and tactile cues required ? ? ?HOME EXERCISE PROGRAM: ?Access Code: [email or web address]: [email or web address] ?Date: 03/25/2021 ?Prepared by: AlDaleen BoExercises ?Sit to Stand with Armchair - 2 x daily - 7 x weekly - 1 sets - 10 reps ?Sitting Knee Extension with Resistance - 1 x daily - 7 x weekly - 3 sets - 10 reps ? ?ASSESSMENT: ? ?CLINICAL IMPRESSION: ?Pt completing all exercises as directed.  Added increased balance component to core strengthening decreasing BOS with core engagement.  Her overall pain has decreased as she states she has times daily when she doesn't have any pain.  She is demonstrating improvements as well in le and core strength as evidenced by pt rising from 4th (bottom) step using ue gaining immediate standing balance indep 7/10 times.  She is coming to wellness center at least 1x week in addition to PT increasing her toleration to activity. She has had no recent falls, reports she does little household chores due to it flaring her LBP "puts me to bed". LTG met with amb > 10 minutes as [place] with eventual increase in pain) and yesterday walking around indoor track. ? ? ? ? ? ?Objective impairments include Abnormal gait, decreased activity tolerance, decreased balance, decreased endurance, decreased mobility, difficulty walking, decreased ROM, decreased strength, hypomobility, increased muscle spasms, impaired flexibility, improper body mechanics, postural dysfunction, and pain. These impairments are limiting patient from cleaning, community activity, driving, meal prep, laundry, yard work, shopping, and exercise/recreation. Personal factors including Age, Fitness, Time since onset of injury/illness/exacerbation, and  1-2 comorbidities:  are also affecting patient's functional outcome. Patient will benefit from skilled PT to address above impairments and improve overall function. ? ?REHAB POTENTIAL: Fair  ? ?CLINICAL DEC

## 2021-06-03 NOTE — Patient Instructions (Addendum)
Good to see you  ? ?Try using the compression stockings on your legs. ? ?Recheck back in 6 weeks. ?

## 2021-06-03 NOTE — Progress Notes (Signed)
? ?  Wynema Birch, am serving as a Neurosurgeon for Dr. Clementeen Graham. ? ?Brandi Cooper is a 76 y.o. female who presents to [facility] at [facility] today for f/u bilat knees, bilat ankle pain, and LBP. Pt was last seen by Dr. Denyse Amass on 04/22/21 and treatment of her LBP was added to the aquatic PT and she's completed 6 total visits. Today, pt reports that the ankles are not doing much better but the knees are doing better with the Aquatic PT. Patient states that she had a fitness assessment yesterday at PT and felt better after her exercises and was able to do a few machines.  ? ?Dx imaging: 04/22/21 L-spine XR ?02/04/21 R & L knee & L ankle XR ? ?Pertinent review of systems: No fevers or chills ? ?Relevant historical information: Diabetes ? ? ?Exam:  ?BP 124/70   Pulse 72   Ht 5\' 5"  (1.651 m)   Wt 185 lb (83.9 kg)   BMI 30.79 kg/m?  ?General: Well Developed, well nourished, and in no acute distress.  ? ?MSK: Left ankle: Nonpitting lower extremity edema. ?Mild left ankle effusion. ?Tender palpation lateral malleolus. ?Normal ankle motion. ?Mild antalgic gait. ? ? ? ?Lab and Radiology Results ? ?EXAM: ?LEFT ANKLE - 2 VIEW ?  ?COMPARISON:  None. ?  ?FINDINGS: ?Soft tissue swelling is noted most prominent laterally. No acute ?fracture or dislocation is noted. Calcaneal spurring is seen. ?  ?IMPRESSION: ?No acute fracture is noted.  Mild soft tissue swelling is seen. ?  ?  ?Electronically Signed ?  By: M.D. ?  On: 02/04/2021 20:07 ?I, 02/06/2021, personally (independently) visualized and performed the interpretation of the images attached in this note. ? ? ? ? ?Assessment and Plan: ?76 y.o. female with ankle pain likely multifactorial.  Some of her pain I think is due to some degenerative changes.  I think also a lot of her pain is because of her general lower extremity edema.  We discussed using compression stockings in the past but she has not gotten them yet.  She has tried some physical  therapy which did not work as well for the ankle.  At this point next step is either an injection or an MRI. ? ?After discussion she will try compression stockings and if that does not work she will let me know and we can proceed to MRI for further diagnostic clarification and for potential injection or surgical planning. ? ?Recheck in 6 weeks.  Continue home exercise program. ? ? ? ?Discussed warning signs or symptoms. Please see discharge instructions. Patient expresses understanding. ? ? ?The above documentation has been reviewed and is accurate and complete 67, M.D. ? ? ?

## 2021-06-10 ENCOUNTER — Ambulatory Visit (HOSPITAL_BASED_OUTPATIENT_CLINIC_OR_DEPARTMENT_OTHER): Payer: Medicare Other | Admitting: Physical Therapy

## 2021-06-10 ENCOUNTER — Encounter (HOSPITAL_BASED_OUTPATIENT_CLINIC_OR_DEPARTMENT_OTHER): Payer: Self-pay | Admitting: Physical Therapy

## 2021-06-10 DIAGNOSIS — M25561 Pain in right knee: Secondary | ICD-10-CM | POA: Diagnosis not present

## 2021-06-10 DIAGNOSIS — G8929 Other chronic pain: Secondary | ICD-10-CM

## 2021-06-10 DIAGNOSIS — M6281 Muscle weakness (generalized): Secondary | ICD-10-CM

## 2021-06-10 DIAGNOSIS — R262 Difficulty in walking, not elsewhere classified: Secondary | ICD-10-CM

## 2021-06-10 NOTE — Therapy (Signed)
?OUTPATIENT PHYSICAL THERAPY TREATMENT NOTE ? ? ?Patient Name: Brandi Cooper ?MRN: [id number] ?DOB:1945/04/28, 76 y.o., female ?Today's Date: 06/10/2021 ? ?PCP: Vivi Barrack, MD ?REFERRING PROVIDER: Vivi Barrack, MD ? ? PT End of Session - 06/10/21 1603   ? ? Visit Number 8   ? Number of Visits 21   ? Date for PT Re-Evaluation 06/23/21   ? Authorization Type BCBS Medicare   ? PT Start Time 1600   ? PT Stop Time [id number]   ? PT Time Calculation (min) 33 min   ? Activity Tolerance Patient tolerated treatment well   ? Behavior During Therapy [facility] for tasks assessed/performed   ? ?  ?  ? ?  ? ? ? ?Past Medical History:  ?Diagnosis Date  ? Chronic kidney disease   ? Diabetes mellitus without complication ([place])   ? GERD (gastroesophageal reflux disease)   ? Glaucoma   ? Heart disease   ? ?Past Surgical History:  ?Procedure Laterality Date  ? ABDOMINAL HYSTERECTOMY    ? blood transfusion    ? BREAST SURGERY    ? Breast Reduction  ? CERVICAL FUSION    ? EYE SURGERY    ? KNEE CARTILAGE SURGERY    ? LUMBAR DISC SURGERY    ? REDUCTION MAMMAPLASTY    ? RETINOPATHY SURGERY    ? SHOULDER SURGERY Bilateral   ? Rotator Cuff  ? TRABECULECTOMY    ? TUBAL LIGATION    ? VITRECTOMY AND CATARACT    ? WRIST SURGERY    ? Carpal Tunnel  ? ?Patient Active Problem List  ? Diagnosis Date Noted  ? Osteoarthritis 01/29/2021  ? Vertigo 09/26/2018  ? Cluster headaches 09/26/2018  ? Dry mouth 09/26/2018  ? Type 2 diabetes mellitus with stage 3 chronic kidney disease, without long-term current use of insulin ([place]) 03/30/2018  ? CKD (chronic kidney disease) stage 3, GFR 30-59 ml/min (HCC) 03/30/2018  ? Hypertension associated with diabetes ([place]) 03/30/2018  ? Glaucoma 03/30/2018  ? Chronic pain of both shoulders 03/30/2018  ? Allergic rhinitis 03/30/2018  ? Anemia 03/30/2018  ? ? ?REFERRING DIAG: M25.561,M25.562,G89.29 (ICD-10-CM) - Chronic pain of both knees M25.571,G89.29,M25.572 (ICD-10-CM) - Chronic pain of both ankles  ? ?THERAPY DIAG:   ?Chronic pain of right knee ? ?Chronic pain of left knee ? ?Muscle weakness (generalized) ? ?Difficulty walking ? ?PERTINENT HISTORY: Stage 3 CKD, diabetes, lumbar pain, neuropathy, glaucoma, diabetic retinopathy ? ?PRECAUTIONS: knee ? ?SUBJECTIVE: "Pain is better.  I got compression stockings, I can tell my swelling is down , ankles feel better"   ? ?PAIN:  ?Are you having pain? Yes ?NPRS scale: Current 0/10 Worst 3/10 ?Pain location: bilateral knees ?Pain orientation: Bilateral  ?PAIN TYPE: aching, burning, sharp, and tingling ?Pain description: intermittent  ?Aggravating factors: walking, down stairs, bending ?Relieving factors: heat ? ?PRECAUTIONS: Knee ? ?WEIGHT BEARING RESTRICTIONS No ? ?FALLS:  ?Has patient fallen in last 6 months? Yes, Number of falls: 2- picking something off the floor ? ?LIVING ENVIRONMENT: ?Lives with: lives with their family ?Lives in: House/apartment ?Stairs: Yes;  ?Has following equipment at home: Single point cane ? ?OCCUPATION: retired ? ?PLOF: Independent with basic ADLs ? ?PATIENT GOALS : pt would like to be able to walk better. She is going on a cruise this year in Oct and wants to walk freely during.  ? ? ?OBJECTIVE:  ? ?DIAGNOSTIC FINDINGS:  ?L knee FINDINGS: ?No acute fracture or dislocation is noted. Mild degenerative changes ?in the patellofemoral joint  space are seen. No joint effusion is ?noted. ?  ?IMPRESSION: ?Mild degenerative change without acute abnormality. ?  ? ?R knee FINDINGS: ?No acute fracture or dislocation is noted. No soft tissue ?abnormality is seen. ?  ?IMPRESSION: ?No acute abnormality noted. ? ?PATIENT SURVEYS:  ?FOTO unable due to time  ? Foto completed 04/22/21: 33 ?-05/27/21 completed 35 ? ?COGNITION: ? Overall cognitive status: Within functional limits for tasks assessed   ?  ?SENSATION: ? Light touch: Deficits neuropathy in legs  ?  ? ?POSTURE:  ?Kyphotic, decreased lumbar lordosis, flex hip gait ? ?PALPATION: ?TTP R quad and lateral joint line ?No TTP  of L  ? ?LE AROM/PROM: ? ?A/PROM Right ?03/25/2021 Left ?03/25/2021  ?Hip extension -5 -10  ?Knee flexion [facility] WFL  ?Knee extension [facility] WFL  ?Ankle dorsiflexion -10 -10  ? (Blank rows = not tested) ? ?LE MMT: ? ?MMT Right ?03/25/2021 Left ?03/25/2021  ?Knee flexion 4+/5 4+/5  ?Knee extension 4/5 4/5  ? (Blank rows = not tested) ? ?LOWER EXTREMITY SPECIAL TESTS:  ?Knee special tests: Anterior drawer test: negative and Varus/Valgus  ? ?FUNCTIONAL TESTS:  ?5 times sit to stand: unable to perform without hands, pain with stand ?TUG: 26s (difficulty with transfer)  ? ? ?GAIT: ?Distance walked: 34ft ?Assistive device utilized: None ?Level of assistance: SBA ?Comments: decreased step length, antalgic, decreased L hip ROM, decreased heel strike  ? ? ? ?TODAY'S TREATMENT: ?Pt seen for aquatic therapy today.  Treatment took place in water 3.25-4.8 ft in depth at the [facility] pool. Temp of water was 91?.  Pt entered/exited the pool via stairs (step through pattern) independently with bilat rail. ? ? ?Reviewed current function, pain levels, response to prior Rx, and HEP compliance.   ?Amb with hand buoys in 4.54ft x  forward, back and side stepping x 4 widths ea. ? ?Seated on bench ?STS from 3rd (bottom) step x10 ?-4th steps x10 ?Flutter kicking 2x25 ?  ?Standing Balance and strengthening: Cues for TrA bracing. ?-kick board push down small BOS core tightened 2x15 ?-kick board push pull 2x15 small BOS core tightened ?-lumbar rotation x20 ? ?Pt requires buoyancy for support and to offload joints with strengthening exercises. Viscosity of the water is needed for resistance of strengthening; water current perturbations provides challenge to standing balance unsupported, requiring increased core activation.  ? ? ? ? ? ? ?PATIENT EDUCATION:  ?Education details: MOI, diagnosis, prognosis, anatomy, exercise progression, DOMS expectations, muscle firing,  envelope of function, HEP, POC ? ?Person educated: Patient ?Education  method: Explanation, Demonstration, Tactile cues, Verbal cues, and Handouts ?Education comprehension: verbalized understanding, returned demonstration, verbal cues required, and tactile cues required ? ? ?HOME EXERCISE PROGRAM: ?Access Code: [email or web address] ?URL: [email or web address] ?Date: 03/25/2021 ?Prepared by: Daleen Bo ?Exercises ?Sit to Stand with Armchair - 2 x daily - 7 x weekly - 1 sets - 10 reps ?Sitting Knee Extension with Resistance - 1 x daily - 7 x weekly - 3 sets - 10 reps ? ?ASSESSMENT: ? ?CLINICAL IMPRESSION: ?Pt late for session limiting time.  She reports she doesn't have any pain today and she is relieved it has finally subsided/improved.  She has improved in her tolerance to amb as she walked through [employer] yesterday for about an hour without fatigue or increase in sx. Able to advance core strengthening and aerobic capacity challenges. Discussed goals with pt as she has progressed very well over a shorter than expected period of time.  Will address going forward in next  2 visits for a re-assess with added goals or potential dc. ? ? ? ? ? ?Objective impairments include Abnormal gait, decreased activity tolerance, decreased balance, decreased endurance, decreased mobility, difficulty walking, decreased ROM, decreased strength, hypomobility, increased muscle spasms, impaired flexibility, improper body mechanics, postural dysfunction, and pain. These impairments are limiting patient from cleaning, community activity, driving, meal prep, laundry, yard work, shopping, and exercise/recreation. Personal factors including Age, Fitness, Time since onset of injury/illness/exacerbation, and 1-2 comorbidities:  are also affecting patient's functional outcome. Patient will benefit from skilled PT to address above impairments and improve overall function. ? ?REHAB POTENTIAL: Fair  ? ?CLINICAL DECISION MAKING: Evolving/moderate complexity ? ?EVALUATION COMPLEXITY: Moderate ? ? ?GOALS: ? ? ?SHORT TERM  GOALS: ? ?STG Name Target Date Goal status  ?1 Pt will become independent with HEP in order to demonstrate synthesis of PT education. ? ?Baseline:  05/06/2021 ?05/13/21 INITIAL ?Achieved  ?2 Pt will report at least 2 p

## 2021-06-17 ENCOUNTER — Ambulatory Visit (HOSPITAL_BASED_OUTPATIENT_CLINIC_OR_DEPARTMENT_OTHER): Payer: Medicare Other | Attending: Family Medicine | Admitting: Physical Therapy

## 2021-06-17 ENCOUNTER — Encounter (HOSPITAL_BASED_OUTPATIENT_CLINIC_OR_DEPARTMENT_OTHER): Payer: Self-pay | Admitting: Physical Therapy

## 2021-06-17 DIAGNOSIS — R262 Difficulty in walking, not elsewhere classified: Secondary | ICD-10-CM | POA: Insufficient documentation

## 2021-06-17 DIAGNOSIS — M25562 Pain in left knee: Secondary | ICD-10-CM | POA: Insufficient documentation

## 2021-06-17 DIAGNOSIS — G8929 Other chronic pain: Secondary | ICD-10-CM | POA: Diagnosis present

## 2021-06-17 DIAGNOSIS — M25561 Pain in right knee: Secondary | ICD-10-CM | POA: Diagnosis present

## 2021-06-17 DIAGNOSIS — M6281 Muscle weakness (generalized): Secondary | ICD-10-CM | POA: Insufficient documentation

## 2021-06-17 NOTE — Therapy (Addendum)
?OUTPATIENT PHYSICAL THERAPY TREATMENT NOTE ? ? ?Patient Name: Brandi Cooper ?MRN: [id number] ?DOB:16-Mar-1945, 76 y.o., female ?Today's Date: 06/17/2021 ? ?PCP: Vivi Barrack, MD ?REFERRING PROVIDER: Vivi Barrack, MD ? ? PT End of Session - 06/17/21 1338   ? ? Visit Number 9   ? Number of Visits 21   ? Date for PT Re-Evaluation 08/12/21   ? Authorization Type BCBS Medicare   ? PT Start Time 1335   ? PT Stop Time 8891   ? PT Time Calculation (min) 40 min   ? Activity Tolerance Patient tolerated treatment well   ? Behavior During Therapy [facility] for tasks assessed/performed   ? ?  ?  ? ?  ? ? ? ?Past Medical History:  ?Diagnosis Date  ? Chronic kidney disease   ? Diabetes mellitus without complication ([place])   ? GERD (gastroesophageal reflux disease)   ? Glaucoma   ? Heart disease   ? ?Past Surgical History:  ?Procedure Laterality Date  ? ABDOMINAL HYSTERECTOMY    ? blood transfusion    ? BREAST SURGERY    ? Breast Reduction  ? CERVICAL FUSION    ? EYE SURGERY    ? KNEE CARTILAGE SURGERY    ? LUMBAR DISC SURGERY    ? REDUCTION MAMMAPLASTY    ? RETINOPATHY SURGERY    ? SHOULDER SURGERY Bilateral   ? Rotator Cuff  ? TRABECULECTOMY    ? TUBAL LIGATION    ? VITRECTOMY AND CATARACT    ? WRIST SURGERY    ? Carpal Tunnel  ? ?Patient Active Problem List  ? Diagnosis Date Noted  ? Osteoarthritis 01/29/2021  ? Vertigo 09/26/2018  ? Cluster headaches 09/26/2018  ? Dry mouth 09/26/2018  ? Type 2 diabetes mellitus with stage 3 chronic kidney disease, without long-term current use of insulin ([place]) 03/30/2018  ? CKD (chronic kidney disease) stage 3, GFR 30-59 ml/min (HCC) 03/30/2018  ? Hypertension associated with diabetes ([place]) 03/30/2018  ? Glaucoma 03/30/2018  ? Chronic pain of both shoulders 03/30/2018  ? Allergic rhinitis 03/30/2018  ? Anemia 03/30/2018  ? ? ?REFERRING DIAG: M25.561,M25.562,G89.29 (ICD-10-CM) - Chronic pain of both knees M25.571,G89.29,M25.572 (ICD-10-CM) - Chronic pain of both ankles  ? ?THERAPY DIAG:  ?Chronic  pain of right knee ? ?Chronic pain of left knee ? ?Muscle weakness (generalized) ? ?Difficulty walking ? ?PERTINENT HISTORY: Stage 3 CKD, diabetes, lumbar pain, neuropathy, glaucoma, diabetic retinopathy ? ?PRECAUTIONS: knee ? ?SUBJECTIVE: "Knees good no pain, wrenched my back over weekend but it is better"   ? ?PAIN:  ?Are you having pain? no ?NPRS scale: Current 0/10 Worst 3/10 ?Pain location: bilateral knees ?Pain orientation: Bilateral  ?PAIN TYPE: aching, burning, sharp, and tingling ?Pain description: intermittent  ?Aggravating factors: walking, down stairs, bending ?Relieving factors: heat ? ?PRECAUTIONS: Knee ? ?WEIGHT BEARING RESTRICTIONS No ? ?FALLS:  ?Has patient fallen in last 6 months? Yes, Number of falls: 2- picking something off the floor ? ?LIVING ENVIRONMENT: ?Lives with: lives with their family ?Lives in: House/apartment ?Stairs: Yes;  ?Has following equipment at home: Single point cane ? ?OCCUPATION: retired ? ?PLOF: Independent with basic ADLs ? ?PATIENT GOALS : pt would like to be able to walk better. She is going on a cruise this year in Oct and wants to walk freely during.  ? ? ?OBJECTIVE:  ? ?DIAGNOSTIC FINDINGS:  ?L knee FINDINGS: ?No acute fracture or dislocation is noted. Mild degenerative changes ?in the patellofemoral joint space are seen. No joint effusion  is ?noted. ?  ?IMPRESSION: ?Mild degenerative change without acute abnormality. ?  ? ?R knee FINDINGS: ?No acute fracture or dislocation is noted. No soft tissue ?abnormality is seen. ?  ?IMPRESSION: ?No acute abnormality noted. ? ?PATIENT SURVEYS:  ?FOTO unable due to time  ? Foto completed 04/22/21: 33 ?-05/27/21 completed 35 ? ?COGNITION: ? Overall cognitive status: Within functional limits for tasks assessed   ?  ?SENSATION: ? Light touch: Deficits neuropathy in legs  ?  ? ?POSTURE:  ?Kyphotic, decreased lumbar lordosis, flex hip gait ? ?PALPATION: ?TTP R quad and lateral joint line ?No TTP of L  ? ?LE AROM/PROM: ? ?A/PROM  Right ?03/25/2021 Left ?03/25/2021 Right ?06/17/21 Left ?06/17/21  ?Hip extension -5 -10 10 15   ?Knee flexion [facility] WFL    ?Knee extension [facility] WFL    ?Ankle dorsiflexion -10 -10 4 0  ? (Blank rows = not tested) ? ?LE MMT: ? ?MMT Right ?03/25/2021 Left ?03/25/2021 Right ?06/17/21 Left ?06/17/21  ?Knee flexion 4+/5 4+/5 5-/5 5-/5  ?Knee extension 4/5 4/5 5/5 5/5  ? (Blank rows = not tested):  ? ?LOWER EXTREMITY SPECIAL TESTS:  ?Knee special tests: Anterior drawer test: negative and Varus/Valgus  ? ?FUNCTIONAL TESTS:  ?5 times sit to stand: unable to perform without hands, pain with stand. Completed 06/17/21: 15s ?TUG: 26s (difficulty with transfer)  Completed 06/17/21: 11.5s ? ? ?GAIT: ?Distance walked: 69f ?Assistive device utilized: None ?Level of assistance: SBA ?Comments: decreased step length, antalgic, decreased L hip ROM, decreased heel strike  ? ? ? ?TODAY'S TREATMENT: ?Pt seen for aquatic therapy today.  Treatment took place in water 3.25-4.8 ft [facility] Temp of water was 91?.  Pt entered/exited the pool via stairs step through pattern independently with bilat rail. ? ? ?Reviewed current function, pain levels, response to prior Rx, and HEP compliance.   ?Objective testing :ROM measurements,strength testing, Tug and 5x STS test ? ?Amb with hand buoys in 4.732fx  forward, back and side stepping x 4 widths ea. ? ?Seated on bench ?STS from 3rd (bottom) step x10 ?-4th steps x10 ?Flutter kicking 2x25 ?  ?Standing Balance and strengthening: Cues for TrA bracing. ?-kick board push down small BOS core tightened 2x15 ?-kick board push pull 2x15 small BOS core tightened ?-lumbar rotation x20 ? ?Pt requires buoyancy for support and to offload joints with strengthening exercises. Viscosity of the water is needed for resistance of strengthening; water current perturbations provides challenge to standing balance unsupported, requiring increased core activation.  ? ? ? ? ? ? ?PATIENT EDUCATION:  ?Education  details: MOI, diagnosis, prognosis, anatomy, exercise progression, DOMS expectations, muscle firing,  envelope of function, HEP, POC ? ?Person educated: Patient ?Education method: Explanation, Demonstration, Tactile cues, Verbal cues, and Handouts ?Education comprehension: verbalized understanding, returned demonstration, verbal cues required, and tactile cues required ? ? ?HOME EXERCISE PROGRAM: ?Access Code: [email or web address]: [email or web address] ?Date: 03/25/2021 ?Prepared by: AlDaleen BoExercises ?Sit to Stand with Armchair - 2 x daily - 7 x weekly - 1 sets - 10 reps ?Sitting Knee Extension with Resistance - 1 x daily - 7 x weekly - 3 sets - 10 reps ? ?ASSESSMENT: ? ?CLINICAL IMPRESSION: ?Re-cert completed. Pt demonstrating improvements in functional mobility, pain as well as strength/ROM.  She has improved on 5 x STS test from not tolerating at initial assessment to completion in 15s without UE support/use; Tug test from 26s to 11.5.  ROM bilat ankles and hip extensors improved considerably  as noted in chart above (approaching [facility]). Strength in bilateral knee up at least 1/2 grade. Her overall pain has decreased to 0/10 with reports of minor pain occasionally. She has not yet met all of her goals ? ? ? ? ? ? ? ?Objective impairments include Abnormal gait, decreased activity tolerance, decreased balance, decreased endurance, decreased mobility, difficulty walking, decreased ROM, decreased strength, hypomobility, increased muscle spasms, impaired flexibility, improper body mechanics, postural dysfunction, and pain. These impairments are limiting patient from cleaning, community activity, driving, meal prep, laundry, yard work, shopping, and exercise/recreation. Personal factors including Age, Fitness, Time since onset of injury/illness/exacerbation, and 1-2 comorbidities:  are also affecting patient's functional outcome. Patient will benefit from skilled PT to address above impairments and improve  overall function. ? ?REHAB POTENTIAL: Fair  ? ?CLINICAL DECISION MAKING: Evolving/moderate complexity ? ?EVALUATION COMPLEXITY: Moderate ? ? ?GOALS: ? ? ?SHORT TERM GOALS: ? ?STG Name Target Date Goal status  ?1

## 2021-06-18 ENCOUNTER — Telehealth: Payer: Self-pay | Admitting: Family Medicine

## 2021-06-18 ENCOUNTER — Other Ambulatory Visit: Payer: Self-pay | Admitting: *Deleted

## 2021-06-18 MED ORDER — TORSEMIDE 20 MG PO TABS: ORAL_TABLET | ORAL | 1 refills | Status: DC

## 2021-06-18 NOTE — Telephone Encounter (Signed)
Pt is needing a refill but needs to have it adjusted per Parker's previous orders. It needs to state 2 times a day and amount will need adjusted as well.  ? ? ?. ?Encourage patient to contact the pharmacy for refills or they can request refills through [facility] ? ?LAST APPOINTMENT DATE:  01/29/21 ? ?NEXT APPOINTMENT DATE: 07/31/21 ? ?MEDICATION:torsemide (DEMADEX) 20 MG tablet ? ?Is the patient out of medication?  ? ?PHARMACY: ?[id number] - [place], [place] - [address] AT [facility] OF [address] Phone:  [phone]  ?Fax:  [phone]  ?  ? ? ?Let patient know to contact pharmacy at the end of the day to make sure medication is ready. ? ?Please notify patient to allow 48-72 hours to process  ?

## 2021-06-18 NOTE — Telephone Encounter (Signed)
Patient states script is not correct.  States she has sever swelling in her ankles and Dr. [name] told her to take 2 pills a day.    Patient is requesting script to be corrected and sent again.  Please follow up with patient at [phone].

## 2021-06-18 NOTE — Telephone Encounter (Signed)
Please advise 

## 2021-06-18 NOTE — Telephone Encounter (Signed)
Rx send to pharmacy  

## 2021-06-22 ENCOUNTER — Other Ambulatory Visit: Payer: Self-pay

## 2021-06-22 ENCOUNTER — Other Ambulatory Visit: Payer: Self-pay | Admitting: *Deleted

## 2021-06-22 NOTE — Telephone Encounter (Signed)
Ok to send in new rx for 40mg  daily as needed. ? ? . Katina Degree, MD ?06/22/2021 12:21 PM  ? ?

## 2021-06-23 ENCOUNTER — Other Ambulatory Visit: Payer: Self-pay | Admitting: *Deleted

## 2021-06-23 MED ORDER — TORSEMIDE 20 MG PO TABS: 40.0000 mg | ORAL_TABLET | Freq: Every day | ORAL | 0 refills | Status: DC

## 2021-06-23 NOTE — Telephone Encounter (Signed)
Rx send to pharmacy  

## 2021-07-01 ENCOUNTER — Ambulatory Visit (HOSPITAL_COMMUNITY)
Admission: RE | Admit: 2021-07-01 | Discharge: 2021-07-01 | Disposition: A | Discharge status: Home / Self Care | Payer: Medicare Other | Source: Ambulatory Visit | Attending: Nephrology | Admitting: Nephrology

## 2021-07-01 VITALS — BP 103/63 | HR 74 | Temp 97.5°F | Resp 16

## 2021-07-01 DIAGNOSIS — N183 Chronic kidney disease, stage 3 unspecified: Secondary | ICD-10-CM | POA: Diagnosis present

## 2021-07-01 LAB — IRON AND TIBC
Iron: 106 ug/dL (ref 28–170)
Saturation Ratios: 34 % — ABNORMAL HIGH (ref 10.4–31.8)
TIBC: 315 ug/dL (ref 250–450)
UIBC: 209 ug/dL

## 2021-07-01 LAB — FERRITIN: Ferritin: 463 ng/mL — ABNORMAL HIGH (ref 11–307)

## 2021-07-01 LAB — POCT HEMOGLOBIN-HEMACUE: Hemoglobin: 10.8 g/dL — ABNORMAL LOW (ref 12.0–15.0)

## 2021-07-01 MED ORDER — EPOETIN ALFA-EPBX 10000 UNIT/ML IJ SOLN
INTRAMUSCULAR | Status: AC
Start: 2021-07-01 — End: 2021-07-01
  Filled 2021-07-01: qty 2

## 2021-07-01 MED ORDER — EPOETIN ALFA-EPBX 10000 UNIT/ML IJ SOLN
15000.0000 [IU] | INTRAMUSCULAR | Status: DC
  Administered 2021-07-01: 15000 [IU] via SUBCUTANEOUS

## 2021-07-05 ENCOUNTER — Other Ambulatory Visit: Payer: Self-pay | Admitting: Family Medicine

## 2021-07-08 ENCOUNTER — Ambulatory Visit (HOSPITAL_BASED_OUTPATIENT_CLINIC_OR_DEPARTMENT_OTHER): Payer: Medicare Other | Admitting: Physical Therapy

## 2021-07-08 ENCOUNTER — Encounter (HOSPITAL_BASED_OUTPATIENT_CLINIC_OR_DEPARTMENT_OTHER): Payer: Self-pay | Admitting: Physical Therapy

## 2021-07-08 DIAGNOSIS — R262 Difficulty in walking, not elsewhere classified: Secondary | ICD-10-CM

## 2021-07-08 DIAGNOSIS — M25561 Pain in right knee: Secondary | ICD-10-CM | POA: Diagnosis not present

## 2021-07-08 DIAGNOSIS — G8929 Other chronic pain: Secondary | ICD-10-CM

## 2021-07-08 DIAGNOSIS — M6281 Muscle weakness (generalized): Secondary | ICD-10-CM

## 2021-07-08 NOTE — Therapy (Signed)
?OUTPATIENT PHYSICAL THERAPY TREATMENT NOTE ? ? ?Patient Name: Brandi Cooper ?MRN: [id number] ?DOB:Mar 02, 1946, 76 y.o., female ?Today's Date: 07/08/2021 ? ?PCP: Vivi Barrack, MD ?REFERRING PROVIDER: Vivi Barrack, MD ? ? PT End of Session - 07/08/21 1552   ? ? Visit Number 10   ? Number of Visits 21   ? Date for PT Re-Evaluation 08/12/21   ? Authorization Type BCBS Medicare   ? Progress Note Due on Visit 19   ? PT Start Time 1551   ? PT Stop Time 1630   ? PT Time Calculation (min) 39 min   ? Activity Tolerance Patient tolerated treatment well   ? Behavior During Therapy [facility] for tasks assessed/performed   ? ?  ?  ? ?  ? ? ? ?Past Medical History:  ?Diagnosis Date  ? Chronic kidney disease   ? Diabetes mellitus without complication ([place])   ? GERD (gastroesophageal reflux disease)   ? Glaucoma   ? Heart disease   ? ?Past Surgical History:  ?Procedure Laterality Date  ? ABDOMINAL HYSTERECTOMY    ? blood transfusion    ? BREAST SURGERY    ? Breast Reduction  ? CERVICAL FUSION    ? EYE SURGERY    ? KNEE CARTILAGE SURGERY    ? LUMBAR DISC SURGERY    ? REDUCTION MAMMAPLASTY    ? RETINOPATHY SURGERY    ? SHOULDER SURGERY Bilateral   ? Rotator Cuff  ? TRABECULECTOMY    ? TUBAL LIGATION    ? VITRECTOMY AND CATARACT    ? WRIST SURGERY    ? Carpal Tunnel  ? ?Patient Active Problem List  ? Diagnosis Date Noted  ? Osteoarthritis 01/29/2021  ? Vertigo 09/26/2018  ? Cluster headaches 09/26/2018  ? Dry mouth 09/26/2018  ? Type 2 diabetes mellitus with stage 3 chronic kidney disease, without long-term current use of insulin ([place]) 03/30/2018  ? CKD (chronic kidney disease) stage 3, GFR 30-59 ml/min (HCC) 03/30/2018  ? Hypertension associated with diabetes ([place]) 03/30/2018  ? Glaucoma 03/30/2018  ? Chronic pain of both shoulders 03/30/2018  ? Allergic rhinitis 03/30/2018  ? Anemia 03/30/2018  ? ? ?REFERRING DIAG: M25.561,M25.562,G89.29 (ICD-10-CM) - Chronic pain of both knees M25.571,G89.29,M25.572 (ICD-10-CM) - Chronic pain of  both ankles  ? ?THERAPY DIAG:  ?Chronic pain of right knee ? ?Chronic pain of left knee ? ?Muscle weakness (generalized) ? ?Difficulty walking ? ?PERTINENT HISTORY: Stage 3 CKD, diabetes, lumbar pain, neuropathy, glaucoma, diabetic retinopathy ? ?PRECAUTIONS: knee ? ?SUBJECTIVE: Pt reports she went bowling Friday and it went well. She did some stretches prior and during; kept her limbered up.   ? ?PAIN:  ?Are you having pain? no ?NPRS scale: Current 0/10 ?Pain location: bilateral knees ?Pain orientation: Bilateral  ?PAIN TYPE: aching, burning, sharp, and tingling ?Pain description: intermittent  ?Aggravating factors: walking, down stairs, bending ?Relieving factors: heat ? ?PRECAUTIONS: Knee ? ?WEIGHT BEARING RESTRICTIONS No ? ?FALLS:  ?Has patient fallen in last 6 months? Yes, Number of falls: 2- picking something off the floor ? ?LIVING ENVIRONMENT: ?Lives with: lives with their family ?Lives in: House/apartment ?Stairs: Yes;  ?Has following equipment at home: Single point cane ? ?OCCUPATION: retired ? ?PLOF: Independent with basic ADLs ? ?PATIENT GOALS : pt would like to be able to walk better. She is going on a cruise this year in Oct and wants to walk freely during.  ? ? ?OBJECTIVE: * Findings taken at [facility] unless otherwise noted.  ? ?DIAGNOSTIC FINDINGS:  ?  L knee FINDINGS: ?No acute fracture or dislocation is noted. Mild degenerative changes ?in the patellofemoral joint space are seen. No joint effusion is ?noted. ?  ?IMPRESSION: ?Mild degenerative change without acute abnormality. ?  ? ?R knee FINDINGS: ?No acute fracture or dislocation is noted. No soft tissue ?abnormality is seen. ?  ?IMPRESSION: ?No acute abnormality noted. ? ?PATIENT SURVEYS:  ?FOTO unable due to time  ? Foto completed 04/22/21: 33 ?-05/27/21 completed 35 ? ?COGNITION: ? Overall cognitive status: Within functional limits for tasks assessed   ?  ?SENSATION: ? Light touch: Deficits neuropathy in legs  ?  ? ?POSTURE:  ?Kyphotic, decreased  lumbar lordosis, flex hip gait ? ?PALPATION: ?TTP R quad and lateral joint line ?No TTP of L  ? ?LE AROM/PROM: ? ?A/PROM Right ?03/25/2021 Left ?03/25/2021 Right ?06/17/21 Left ?06/17/21  ?Hip extension -5 -10 10 15   ?Knee flexion [facility] WFL    ?Knee extension [facility] WFL    ?Ankle dorsiflexion -10 -10 4 0  ? (Blank rows = not tested) ? ?LE MMT: ? ?MMT Right ?03/25/2021 Left ?03/25/2021 Right ?06/17/21 Left ?06/17/21  ?Knee flexion 4+/5 4+/5 5-/5 5-/5  ?Knee extension 4/5 4/5 5/5 5/5  ? (Blank rows = not tested):  ? ?LOWER EXTREMITY SPECIAL TESTS:  ?Knee special tests: Anterior drawer test: negative and Varus/Valgus  ? ?FUNCTIONAL TESTS:  ?5 times sit to stand: unable to perform without hands, pain with stand. Completed 06/17/21: 15s ?TUG: 26s (difficulty with transfer)  Completed 06/17/21: 11.5s ? ? ?GAIT: ?Distance walked: 23ft ?Assistive device utilized: None ?Level of assistance: SBA ?Comments: decreased step length, antalgic, decreased L hip ROM, decreased heel strike  ? ? ? ?TODAY'S TREATMENT: ?Pt seen for aquatic therapy today.  Treatment took place in water 3.25-4.8 ft in depth at the [facility] pool. Temp of water was 91?.  Pt entered/exited the pool via stairs step through pattern independently with bilat rail. ? ?Warm up of forward, back and side stepping x 4 laps each; holding onto yellow noodle. ?High knee marching forward/ backward  ? ?Holding wall:  ?Heel / toe raises x 15  ? ?Standing Balance and strengthening: Cues for TrA bracing. ?-kick board push down with knees unlocked, 5 sec hold (slightly submerged) x 10 ?-kick board push pull 2x15 small BOS core tightened ?-lumbar rotation x 10 each direction, holding yellow hand buoys on surface with arms abdct ? ?Seated on 4th step:   ?Flutter kick x 20 ?Sit to/from stand without UE support x 10  ?  ?Pt requires buoyancy for support and to offload joints with strengthening exercises. Viscosity of the water is needed for resistance of strengthening; water current  perturbations provides challenge to standing balance unsupported, requiring increased core activation.  ? ? ? ? ? ? ?PATIENT EDUCATION:  ?Education details: MOI, diagnosis, prognosis, anatomy, exercise progression, DOMS expectations, muscle firing,  envelope of function, HEP, POC ? ?Person educated: Patient ?Education method: Explanation, Demonstration, Tactile cues, Verbal cues, and Handouts ?Education comprehension: verbalized understanding, returned demonstration, verbal cues required, and tactile cues required ? ? ?HOME EXERCISE PROGRAM: ?Access Code: [email or web address] ?URL: [email or web address] ?Date: 03/25/2021 ?Prepared by: Daleen Bo ?Exercises ?Sit to Stand with Armchair - 2 x daily - 7 x weekly - 1 sets - 10 reps ?Sitting Knee Extension with Resistance - 1 x daily - 7 x weekly - 3 sets - 10 reps ? ?ASSESSMENT: ? ?CLINICAL IMPRESSION: ?Pt was able to complete exercises without increase in bilat knee pain.  She requires minor  cues for posture and form.  Sit to/from stand on stairs was challenging; initially require UE on rails/ able to decrease to no UE support after 3 reps. Pt is progressing well towards remaining goals.  Transportation is a barrier to access to [employer] as she doesn't drive and relies on her daughter to drive her when available (days off from work).  She plans to use pool at apt once weather allows. Will continue to discuss d/c plan in future visits.  ? ?Objective impairments include Abnormal gait, decreased activity tolerance, decreased balance, decreased endurance, decreased mobility, difficulty walking, decreased ROM, decreased strength, hypomobility, increased muscle spasms, impaired flexibility, improper body mechanics, postural dysfunction, and pain. These impairments are limiting patient from cleaning, community activity, driving, meal prep, laundry, yard work, shopping, and exercise/recreation. Personal factors including Age, Fitness, Time since onset of  injury/illness/exacerbation, and 1-2 comorbidities:  are also affecting patient's functional outcome. Patient will benefit from skilled PT to address above impairments and improve overall function. ? ?REHAB POTENTIAL: Fair  ? ?CLINICAL

## 2021-07-14 ENCOUNTER — Ambulatory Visit (HOSPITAL_BASED_OUTPATIENT_CLINIC_OR_DEPARTMENT_OTHER): Payer: Medicare Other | Attending: Family Medicine | Admitting: Physical Therapy

## 2021-07-14 ENCOUNTER — Encounter (HOSPITAL_BASED_OUTPATIENT_CLINIC_OR_DEPARTMENT_OTHER): Payer: Self-pay | Admitting: Physical Therapy

## 2021-07-14 ENCOUNTER — Ambulatory Visit: Payer: Medicare Other | Admitting: Family Medicine

## 2021-07-14 DIAGNOSIS — M6281 Muscle weakness (generalized): Secondary | ICD-10-CM | POA: Diagnosis present

## 2021-07-14 DIAGNOSIS — M542 Cervicalgia: Secondary | ICD-10-CM | POA: Insufficient documentation

## 2021-07-14 DIAGNOSIS — M25561 Pain in right knee: Secondary | ICD-10-CM | POA: Insufficient documentation

## 2021-07-14 DIAGNOSIS — R262 Difficulty in walking, not elsewhere classified: Secondary | ICD-10-CM | POA: Diagnosis present

## 2021-07-14 DIAGNOSIS — G8929 Other chronic pain: Secondary | ICD-10-CM | POA: Diagnosis present

## 2021-07-14 DIAGNOSIS — M25562 Pain in left knee: Secondary | ICD-10-CM | POA: Diagnosis present

## 2021-07-14 NOTE — Therapy (Signed)
?OUTPATIENT PHYSICAL THERAPY TREATMENT NOTE ? ? ?Patient Name: Brandi Cooper ?MRN: [id number] ?DOB:12/09/1945, 76 y.o., female ?Today's Date: 07/14/2021 ? ?PCP: Vivi Barrack, MD ?REFERRING PROVIDER: Vivi Barrack, MD ? ? PT End of Session - 07/14/21 1714   ? ? Visit Number 11   ? Number of Visits 21   ? Date for PT Re-Evaluation 08/12/21   ? Authorization Type BCBS Medicare   ? Progress Note Due on Visit 19   ? PT Start Time [id number]   ? PT Stop Time [id number]   ? PT Time Calculation (min) 39 min   ? Activity Tolerance Patient tolerated treatment well   ? Behavior During Therapy [facility] for tasks assessed/performed   ? ?  ?  ? ?  ? ? ? ?Past Medical History:  ?Diagnosis Date  ? Chronic kidney disease   ? Diabetes mellitus without complication ([place])   ? GERD (gastroesophageal reflux disease)   ? Glaucoma   ? Heart disease   ? ?Past Surgical History:  ?Procedure Laterality Date  ? ABDOMINAL HYSTERECTOMY    ? blood transfusion    ? BREAST SURGERY    ? Breast Reduction  ? CERVICAL FUSION    ? EYE SURGERY    ? KNEE CARTILAGE SURGERY    ? LUMBAR DISC SURGERY    ? REDUCTION MAMMAPLASTY    ? RETINOPATHY SURGERY    ? SHOULDER SURGERY Bilateral   ? Rotator Cuff  ? TRABECULECTOMY    ? TUBAL LIGATION    ? VITRECTOMY AND CATARACT    ? WRIST SURGERY    ? Carpal Tunnel  ? ?Patient Active Problem List  ? Diagnosis Date Noted  ? Osteoarthritis 01/29/2021  ? Vertigo 09/26/2018  ? Cluster headaches 09/26/2018  ? Dry mouth 09/26/2018  ? Type 2 diabetes mellitus with stage 3 chronic kidney disease, without long-term current use of insulin ([place]) 03/30/2018  ? CKD (chronic kidney disease) stage 3, GFR 30-59 ml/min (HCC) 03/30/2018  ? Hypertension associated with diabetes ([place]) 03/30/2018  ? Glaucoma 03/30/2018  ? Chronic pain of both shoulders 03/30/2018  ? Allergic rhinitis 03/30/2018  ? Anemia 03/30/2018  ? ? ?REFERRING DIAG: M25.561,M25.562,G89.29 (ICD-10-CM) - Chronic pain of both knees M25.571,G89.29,M25.572 (ICD-10-CM) - Chronic pain of both  ankles  ? ?THERAPY DIAG:  ?Chronic pain of right knee ? ?Chronic pain of left knee ? ?Muscle weakness (generalized) ? ?Difficulty walking ? ?PERTINENT HISTORY: Stage 3 CKD, diabetes, lumbar pain, neuropathy, glaucoma, diabetic retinopathy ? ?PRECAUTIONS: knee ? ?SUBJECTIVE: "knee is a little soar don't know why"     ? ?PAIN:  ?Are you having pain? no ?NPRS scale: Current 1/10 ?Pain location: bilateral knees ?Pain orientation: Bilateral  ?PAIN TYPE: aching, burning, sharp, and tingling ?Pain description: intermittent  ?Aggravating factors: walking, down stairs, bending ?Relieving factors: heat ? ?PRECAUTIONS: Knee ? ?WEIGHT BEARING RESTRICTIONS No ? ?FALLS:  ?Has patient fallen in last 6 months? Yes, Number of falls: 2- picking something off the floor ? ?LIVING ENVIRONMENT: ?Lives with: lives with their family ?Lives in: House/apartment ?Stairs: Yes;  ?Has following equipment at home: Single point cane ? ?OCCUPATION: retired ? ?PLOF: Independent with basic ADLs ? ?PATIENT GOALS : pt would like to be able to walk better. She is going on a cruise this year in Oct and wants to walk freely during.  ? ? ?OBJECTIVE: * Findings taken at [facility] unless otherwise noted.  ? ?DIAGNOSTIC FINDINGS:  ?L knee FINDINGS: ?No acute fracture or dislocation is noted. Mild  degenerative changes ?in the patellofemoral joint space are seen. No joint effusion is ?noted. ?  ?IMPRESSION: ?Mild degenerative change without acute abnormality. ?  ? ?R knee FINDINGS: ?No acute fracture or dislocation is noted. No soft tissue ?abnormality is seen. ?  ?IMPRESSION: ?No acute abnormality noted. ? ?PATIENT SURVEYS:  ?FOTO unable due to time  ? Foto completed 04/22/21: 33 ?-05/27/21 completed 35 ? ?COGNITION: ? Overall cognitive status: Within functional limits for tasks assessed   ?  ?SENSATION: ? Light touch: Deficits neuropathy in legs  ?  ? ?POSTURE:  ?Kyphotic, decreased lumbar lordosis, flex hip gait ? ?PALPATION: ?TTP R quad and lateral joint line ?No  TTP of L  ? ?LE AROM/PROM: ? ?A/PROM Right ?03/25/2021 Left ?03/25/2021 Right ?06/17/21 Left ?06/17/21  ?Hip extension -5 -10 10 15   ?Knee flexion [facility] WFL    ?Knee extension [facility] WFL    ?Ankle dorsiflexion -10 -10 4 0  ? (Blank rows = not tested) ? ?LE MMT: ? ?MMT Right ?03/25/2021 Left ?03/25/2021 Right ?06/17/21 Left ?06/17/21  ?Knee flexion 4+/5 4+/5 5-/5 5-/5  ?Knee extension 4/5 4/5 5/5 5/5  ? (Blank rows = not tested):  ? ?LOWER EXTREMITY SPECIAL TESTS:  ?Knee special tests: Anterior drawer test: negative and Varus/Valgus  ? ?FUNCTIONAL TESTS:  ?5 times sit to stand: unable to perform without hands, pain with stand. Completed 06/17/21: 15s ?TUG: 26s (difficulty with transfer)  Completed 06/17/21: 11.5s ? ? ?GAIT: ?Distance walked: 49ft ?Assistive device utilized: None ?Level of assistance: SBA ?Comments: decreased step length, antalgic, decreased L hip ROM, decreased heel strike  ? ? ? ?TODAY'S TREATMENT: ?Pt seen for aquatic therapy today.  Treatment took place in water 3.25-4.8 ft in depth at the [facility] pool. Temp of water was 91?.  Pt entered/exited the pool via stairs step through pattern independently with bilat rail. ? ?Warm up of forward, back and side stepping x 4 laps each; holding onto yellow noodle. ?High knee marching forward/ backward  ? ?Seated on 4th step:   ?Flutter kick 3x 20 ?-add/abd 3x20 ?Sit to/from stand without UE support x 10  ? ?Yellow hand buoys: ? -submerged walking forward and back x 4ea ? -Heel / toe raises x 15 ea; high knee marching; add/abd; hip flex; hip ext x12 ? ?Standing Balance and strengthening: Cues for TrA bracing. ?-step ups forward, backward and side stepping x 10 leading with each R/L ? ?Standing ?-Runner stretch gastroc and hamstring 3x20s ?-Hip flex/quad stretch holding to wall ? ?Pt requires buoyancy for support and to offload joints with strengthening exercises. Viscosity of the water is needed for resistance of strengthening; water current perturbations provides  challenge to standing balance unsupported, requiring increased core activation.  ? ? ? ? ? ? ?PATIENT EDUCATION:  ?Education details: MOI, diagnosis, prognosis, anatomy, exercise progression, DOMS expectations, muscle firing,  envelope of function, HEP, POC ? ?Person educated: Patient ?Education method: Explanation, Demonstration, Tactile cues, Verbal cues, and Handouts ?Education comprehension: verbalized understanding, returned demonstration, verbal cues required, and tactile cues required ? ? ?HOME EXERCISE PROGRAM: ?Access Code: [email or web address] ?URL: [email or web address] ?Date: 03/25/2021 ?Prepared by: Daleen Bo ?Exercises ?Sit to Stand with Armchair - 2 x daily - 7 x weekly - 1 sets - 10 reps ?Sitting Knee Extension with Resistance - 1 x daily - 7 x weekly - 3 sets - 10 reps ? ?ASSESSMENT: ? ?CLINICAL IMPRESSION: ?Minor cues for core stabilization throughout session and proper execution of exercises.  Pt challenged with backward step ups  combining LE strengthening with dynamic balance. STS from 3 step without ue support improved with less difficulty gaining immediate standing balance 8/10 x. Goals ongoing ? ? ?Objective impairments include Abnormal gait, decreased activity tolerance, decreased balance, decreased endurance, decreased mobility, difficulty walking, decreased ROM, decreased strength, hypomobility, increased muscle spasms, impaired flexibility, improper body mechanics, postural dysfunction, and pain. These impairments are limiting patient from cleaning, community activity, driving, meal prep, laundry, yard work, shopping, and exercise/recreation. Personal factors including Age, Fitness, Time since onset of injury/illness/exacerbation, and 1-2 comorbidities:  are also affecting patient's functional outcome. Patient will benefit from skilled PT to address above impairments and improve overall function. ? ?REHAB POTENTIAL: Fair  ? ?CLINICAL DECISION MAKING: Evolving/moderate  complexity ? ?EVALUATION COMPLEXITY: Moderate ? ? ?GOALS: ? ? ?SHORT TERM GOALS: ? ?STG Name Target Date Goal status  ?1 Pt will become independent with HEP in order to demonstrate synthesis of PT education. ? ?Baseline:

## 2021-07-14 NOTE — Progress Notes (Deleted)
   I, Philbert Riser, LAT, ATC acting as a scribe for Clementeen Graham, MD.  Brandi Cooper is a 76 y.o. female who presents to [facility] at [facility] today for  f/u bilat knees, bilat ankle pain, and LBP. Pt was last seen by Dr. Denyse Amass on 06/03/21 and was advised to use compression stockings. Pt cont aquatic PT, completing 10 visits. Today, pt reports  Dx imaging: 04/22/21 L-spine XR 02/04/21 R & L knee & L ankle XR  Pertinent review of systems: ***  Relevant historical information: ***   Exam:  There were no vitals taken for this visit. General: Well Developed, well nourished, and in no acute distress.   MSK: ***    Lab and Radiology Results No results found for this or any previous visit (from the past 72 hour(s)). No results found.     Assessment and Plan: 76 y.o. female with ***   PDMP not reviewed this encounter. No orders of the defined types were placed in this encounter.  No orders of the defined types were placed in this encounter.    Discussed warning signs or symptoms. Please see discharge instructions. Patient expresses understanding.   ***

## 2021-07-15 ENCOUNTER — Encounter: Payer: Self-pay | Admitting: *Deleted

## 2021-07-21 ENCOUNTER — Ambulatory Visit (HOSPITAL_BASED_OUTPATIENT_CLINIC_OR_DEPARTMENT_OTHER): Payer: Medicare Other | Admitting: Physical Therapy

## 2021-07-22 ENCOUNTER — Encounter (HOSPITAL_BASED_OUTPATIENT_CLINIC_OR_DEPARTMENT_OTHER): Payer: Self-pay | Admitting: Physical Therapy

## 2021-07-22 ENCOUNTER — Ambulatory Visit (HOSPITAL_BASED_OUTPATIENT_CLINIC_OR_DEPARTMENT_OTHER): Payer: Medicare Other | Admitting: Physical Therapy

## 2021-07-22 DIAGNOSIS — M25561 Pain in right knee: Secondary | ICD-10-CM | POA: Diagnosis not present

## 2021-07-22 DIAGNOSIS — G8929 Other chronic pain: Secondary | ICD-10-CM

## 2021-07-22 DIAGNOSIS — M6281 Muscle weakness (generalized): Secondary | ICD-10-CM

## 2021-07-22 DIAGNOSIS — R262 Difficulty in walking, not elsewhere classified: Secondary | ICD-10-CM

## 2021-07-22 NOTE — Therapy (Signed)
?OUTPATIENT PHYSICAL THERAPY TREATMENT NOTE ? ? ?Patient Name: Brandi Cooper ?MRN: [id number] ?DOB:[birth year], 76 y.o., female ?Today's Date: 07/22/2021 ? ?PCP: Vivi Barrack, MD ?REFERRING PROVIDER: Vivi Barrack, MD ? ? PT End of Session - 07/22/21 1551   ? ? Visit Number 12   ? Number of Visits 21   ? Date for PT Re-Evaluation 08/12/21   ? Authorization Type BCBS Medicare   ? Progress Note Due on Visit 19   ? PT Start Time 3329   ? PT Stop Time 1628   ? PT Time Calculation (min) 40 min   ? Activity Tolerance Patient tolerated treatment well   ? Behavior During Therapy [facility] for tasks assessed/performed   ? ?  ?  ? ?  ? ? ? ?Past Medical History:  ?Diagnosis Date  ? Chronic kidney disease   ? Diabetes mellitus without complication ([place])   ? GERD (gastroesophageal reflux disease)   ? Glaucoma   ? Heart disease   ? ?Past Surgical History:  ?Procedure Laterality Date  ? ABDOMINAL HYSTERECTOMY    ? blood transfusion    ? BREAST SURGERY    ? Breast Reduction  ? CERVICAL FUSION    ? EYE SURGERY    ? KNEE CARTILAGE SURGERY    ? LUMBAR DISC SURGERY    ? REDUCTION MAMMAPLASTY    ? RETINOPATHY SURGERY    ? SHOULDER SURGERY Bilateral   ? Rotator Cuff  ? TRABECULECTOMY    ? TUBAL LIGATION    ? VITRECTOMY AND CATARACT    ? WRIST SURGERY    ? Carpal Tunnel  ? ?Patient Active Problem List  ? Diagnosis Date Noted  ? Osteoarthritis 01/29/2021  ? Vertigo 09/26/2018  ? Cluster headaches 09/26/2018  ? Dry mouth 09/26/2018  ? Type 2 diabetes mellitus with stage 3 chronic kidney disease, without long-term current use of insulin ([place]) 03/30/2018  ? CKD (chronic kidney disease) stage 3, GFR 30-59 ml/min (HCC) 03/30/2018  ? Hypertension associated with diabetes ([place]) 03/30/2018  ? Glaucoma 03/30/2018  ? Chronic pain of both shoulders 03/30/2018  ? Allergic rhinitis 03/30/2018  ? Anemia 03/30/2018  ? ? ?REFERRING DIAG: M25.561,M25.562,G89.29 (ICD-10-CM) - Chronic pain of both knees M25.571,G89.29,M25.572 (ICD-10-CM) - Chronic pain of  both ankles  ? ?THERAPY DIAG:  ?THERAPY DIAG:  ?Chronic pain of right knee ?  ?Chronic pain of left knee ?  ?Muscle weakness (generalized) ?  ?Difficulty walking   ? ?PERTINENT HISTORY: Stage 3 CKD, diabetes, lumbar pain, neuropathy, glaucoma, diabetic retinopathy ? ?PRECAUTIONS: knee ? ?SUBJECTIVE: Pt reports her knee pain is off/on, "They'll creek and pop".  She reports that her neck has been bothering her lately; thinks it is her pillow/ how she is sleeping.   Pain in neck resolve with heating pad. She plans to continue exercises in pool once her apartment pool (outdoor) is open, 3x/wk. ? ?PAIN:  ?Are you having pain? no ?NPRS scale: Current 0/10 ? ?PRECAUTIONS: Knee ? ?WEIGHT BEARING RESTRICTIONS No ? ?FALLS:  ?Has patient fallen in last 6 months? Yes, Number of falls: 2- picking something off the floor ? ?LIVING ENVIRONMENT: ?Lives with: lives with their family ?Lives in: House/apartment ?Stairs: Yes;  ?Has following equipment at home: Single point cane ? ?OCCUPATION: retired ? ?PLOF: Independent with basic ADLs ? ?PATIENT GOALS : pt would like to be able to walk better. She is going on a cruise this year in Oct and wants to walk freely during.  ? ? ?OBJECTIVE: *  Findings taken at [facility] unless otherwise noted.  ? ?DIAGNOSTIC FINDINGS:  ?L knee FINDINGS: ?No acute fracture or dislocation is noted. Mild degenerative changes ?in the patellofemoral joint space are seen. No joint effusion is ?noted. ?  ?IMPRESSION: ?Mild degenerative change without acute abnormality. ?  ? ?R knee FINDINGS: ?No acute fracture or dislocation is noted. No soft tissue ?abnormality is seen. ?  ?IMPRESSION: ?No acute abnormality noted. ? ?PATIENT SURVEYS:  ?FOTO unable due to time  ? Foto completed 04/22/21: 33 ?-05/27/21 completed 35 ? ?COGNITION: ? Overall cognitive status: Within functional limits for tasks assessed   ?  ?SENSATION: ? Light touch: Deficits neuropathy in legs  ?  ? ?POSTURE:  ?Kyphotic, decreased lumbar lordosis, flex hip  gait ? ?PALPATION: ?TTP R quad and lateral joint line ?No TTP of L  ? ?LE AROM/PROM: ? ?A/PROM Right ?03/25/2021 Left ?03/25/2021 Right ?06/17/21 Left ?06/17/21  ?Hip extension -5 -_0 ?Knee flexion [facility] WFL    ?Knee extension [facility] WFL    ?Ankle dorsiflexion -10 -10 4 0  ? (Blank rows = not tested) ? ?LE MMT: ? ?MMT Right ?03/25/2021 Left ?03/25/2021 Right ?06/17/21 Left ?06/17/21  ?Knee flexion 4+/5 4+/5 5-/5 5-/5  ?Knee extension 4/5 4/5 5/5 5/5  ? (Blank rows = not tested):  ? ?LOWER EXTREMITY SPECIAL TESTS:  ?Knee special tests: Anterior drawer test: negative and Varus/Valgus  ? ?FUNCTIONAL TESTS:  ?5 times sit to stand: unable to perform without hands, pain with stand. Completed 06/17/21: 15s ?TUG: 26s (difficulty with transfer)  Completed 06/17/21: 11.5s ? ? ?GAIT: ?Distance walked: 61f ?Assistive device utilized: None ?Level of assistance: SBA ?Comments: decreased step length, antalgic, decreased L hip ROM, decreased heel strike  ? ? ? ?TODAY'S TREATMENT: ?Pt seen for aquatic therapy today.  Treatment took place in water 3.25-4.8 ft [facility] Temp of water was 91?.  Pt entered/exited the pool via stairs step through pattern independently with bilat rail. ? ?Warm up of forward, back and side stepping x 4 laps each; holding onto yellow noodle. ?Side step with squat traveling R/L followed by ab set and blue noodle press 5 sec after each squat ?High knee marching forward/ backward  ?Holding yellow noodle: hip abdct x 12 each leg, trial of leg swings forward/backward (to unsteady) - moved to holding wall with one arm ?   Hip flex/ext x 5 reps, 2 sets - cues for straight knee ?Heel raises x 20 reps ? Seated on 4th step:  Flutter kick 3x 20,abdct/add 3x20;  sit to/from stand without support x 5; paused for break due to increased unsteadiness- repeated STS with UE support and eccentric lowering x 5 ? ?Yellow hand buoys: ? -submerged walking forward and back x 4 widths ea ?  ?Pt requires buoyancy  for support and to offload joints with strengthening exercises. Viscosity of the water is needed for resistance of strengthening; water current perturbations provides challenge to standing balance unsupported, requiring increased core activation.  ? ? ?PATIENT EDUCATION:  ?Education details: MOI, diagnosis, prognosis, anatomy, exercise progression, DOMS expectations, muscle firing,  envelope of function, HEP, POC ? ?Person educated: Patient ?Education method: Explanation, Demonstration, Tactile cues, Verbal cues, and Handouts ?Education comprehension: verbalized understanding, returned demonstration, verbal cues required, and tactile cues required ? ? ?HOME EXERCISE PROGRAM: ?Access Code: [email or web address]?URL: [email or web address] ?Date: 03/25/2021 ?Prepared by: ADaleen Bo?Exercises ?Sit to Stand with Armchair - 2 x daily - 7 x weekly - 1 sets -  10 reps ?Sitting Knee Extension with Resistance - 1 x daily - 7 x weekly - 3 sets - 10 reps ? ?ASSESSMENT: ? ?CLINICAL IMPRESSION: ?Pt has met LTG #3 and 4 with improved TUG and STS time.  Minor cues for core stabilization throughout session and proper execution of exercises.  She was unsteady with single leg hip flex/ ext; required more support than yellow noodle.  She became unsteady during sit to stands on steps; reported it may be due to her blood sugar.  Supervising PT, Denton Meek, issued pt small grape juice and pt reported no further ill effects. Pt making great progress towards remaining goals.   ? ?Objective impairments include Abnormal gait, decreased activity tolerance, decreased balance, decreased endurance, decreased mobility, difficulty walking, decreased ROM, decreased strength, hypomobility, increased muscle spasms, impaired flexibility, improper body mechanics, postural dysfunction, and pain. These impairments are limiting patient from cleaning, community activity, driving, meal prep, laundry, yard work, shopping, and exercise/recreation.  Personal factors including Age, Fitness, Time since onset of injury/illness/exacerbation, and 1-2 comorbidities:  are also affecting patient's functional outcome. Patient will benefit from skilled PT to address

## 2021-07-28 ENCOUNTER — Encounter (HOSPITAL_BASED_OUTPATIENT_CLINIC_OR_DEPARTMENT_OTHER): Payer: Self-pay | Admitting: Physical Therapy

## 2021-07-28 ENCOUNTER — Ambulatory Visit (HOSPITAL_BASED_OUTPATIENT_CLINIC_OR_DEPARTMENT_OTHER): Payer: Medicare Other | Admitting: Physical Therapy

## 2021-07-28 DIAGNOSIS — R262 Difficulty in walking, not elsewhere classified: Secondary | ICD-10-CM

## 2021-07-28 DIAGNOSIS — M6281 Muscle weakness (generalized): Secondary | ICD-10-CM

## 2021-07-28 DIAGNOSIS — G8929 Other chronic pain: Secondary | ICD-10-CM

## 2021-07-28 DIAGNOSIS — M25561 Pain in right knee: Secondary | ICD-10-CM | POA: Diagnosis not present

## 2021-07-28 NOTE — Therapy (Signed)
?OUTPATIENT PHYSICAL THERAPY TREATMENT NOTE ? ? ?Patient Name: Brandi Cooper ?MRN: [id number] ?DOB:1946/01/09, 76 y.o., female ?Today's Date: 07/28/2021 ? ?PCP: Vivi Barrack, MD ?REFERRING PROVIDER: Vivi Barrack, MD ? ? PT End of Session - 07/28/21 1714   ? ? Visit Number 13   ? Number of Visits 21   ? Date for PT Re-Evaluation 08/12/21   ? Authorization Type BCBS Medicare   ? PT Start Time 1710   ? PT Stop Time [id number]   ? PT Time Calculation (min) 38 min   ? Activity Tolerance Patient tolerated treatment well   ? Behavior During Therapy [facility] for tasks assessed/performed   ? ?  ?  ? ?  ? ? ? ?Past Medical History:  ?Diagnosis Date  ? Chronic kidney disease   ? Diabetes mellitus without complication ([place])   ? GERD (gastroesophageal reflux disease)   ? Glaucoma   ? Heart disease   ? ?Past Surgical History:  ?Procedure Laterality Date  ? ABDOMINAL HYSTERECTOMY    ? blood transfusion    ? BREAST SURGERY    ? Breast Reduction  ? CERVICAL FUSION    ? EYE SURGERY    ? KNEE CARTILAGE SURGERY    ? LUMBAR DISC SURGERY    ? REDUCTION MAMMAPLASTY    ? RETINOPATHY SURGERY    ? SHOULDER SURGERY Bilateral   ? Rotator Cuff  ? TRABECULECTOMY    ? TUBAL LIGATION    ? VITRECTOMY AND CATARACT    ? WRIST SURGERY    ? Carpal Tunnel  ? ?Patient Active Problem List  ? Diagnosis Date Noted  ? Osteoarthritis 01/29/2021  ? Vertigo 09/26/2018  ? Cluster headaches 09/26/2018  ? Dry mouth 09/26/2018  ? Type 2 diabetes mellitus with stage 3 chronic kidney disease, without long-term current use of insulin ([place]) 03/30/2018  ? CKD (chronic kidney disease) stage 3, GFR 30-59 ml/min (HCC) 03/30/2018  ? Hypertension associated with diabetes ([place]) 03/30/2018  ? Glaucoma 03/30/2018  ? Chronic pain of both shoulders 03/30/2018  ? Allergic rhinitis 03/30/2018  ? Anemia 03/30/2018  ? ? ?REFERRING DIAG: M25.561,M25.562,G89.29 (ICD-10-CM) - Chronic pain of both knees M25.571,G89.29,M25.572 (ICD-10-CM) - Chronic pain of both ankles  ? ?THERAPY DIAG:   ?THERAPY DIAG:  ?Chronic pain of right knee ?  ?Chronic pain of left knee ?  ?Muscle weakness (generalized) ?  ?Difficulty walking   ? ?PERTINENT HISTORY: Stage 3 CKD, diabetes, lumbar pain, neuropathy, glaucoma, diabetic retinopathy ? ?PRECAUTIONS: knee ? ?SUBJECTIVE:"Felt good after last treatment.  Drank the grape juice and was fine. I feel like I am still gaining strength. I can walk further now without my cane although I bring it with me" ? ?PAIN:  ?Are you having pain? no ?NPRS scale: Current 0/10 ? ?PRECAUTIONS: Knee ? ?WEIGHT BEARING RESTRICTIONS No ? ?FALLS:  ?Has patient fallen in last 6 months? Yes, Number of falls: 2- picking something off the floor ? ?LIVING ENVIRONMENT: ?Lives with: lives with their family ?Lives in: House/apartment ?Stairs: Yes;  ?Has following equipment at home: Single point cane ? ?OCCUPATION: retired ? ?PLOF: Independent with basic ADLs ? ?PATIENT GOALS : pt would like to be able to walk better. She is going on a cruise this year in Oct and wants to walk freely during.  ? ? ?OBJECTIVE: * Findings taken at [facility] unless otherwise noted.  ? ?DIAGNOSTIC FINDINGS:  ?L knee FINDINGS: ?No acute fracture or dislocation is noted. Mild degenerative changes ?in the patellofemoral joint space  are seen. No joint effusion is ?noted. ?  ?IMPRESSION: ?Mild degenerative change without acute abnormality. ?  ? ?R knee FINDINGS: ?No acute fracture or dislocation is noted. No soft tissue ?abnormality is seen. ?  ?IMPRESSION: ?No acute abnormality noted. ? ?PATIENT SURVEYS:  ?FOTO unable due to time  ? Foto completed 04/22/21: 33 ?-05/27/21 completed 35 ? ?COGNITION: ? Overall cognitive status: Within functional limits for tasks assessed   ?  ?SENSATION: ? Light touch: Deficits neuropathy in legs  ?  ? ?POSTURE:  ?Kyphotic, decreased lumbar lordosis, flex hip gait ? ?PALPATION: ?TTP R quad and lateral joint line ?No TTP of L  ? ?LE AROM/PROM: ? ?A/PROM Right ?03/25/2021 Left ?03/25/2021 Right ?06/17/21  Left ?06/17/21  ?Hip extension -5 -10 10 15   ?Knee flexion [facility] WFL    ?Knee extension [facility] WFL    ?Ankle dorsiflexion -10 -10 4 0  ? (Blank rows = not tested) ? ?LE MMT: ? ?MMT Right ?03/25/2021 Left ?03/25/2021 Right ?06/17/21 Left ?06/17/21  ?Knee flexion 4+/5 4+/5 5-/5 5-/5  ?Knee extension 4/5 4/5 5/5 5/5  ? (Blank rows = not tested):  ? ?LOWER EXTREMITY SPECIAL TESTS:  ?Knee special tests: Anterior drawer test: negative and Varus/Valgus  ? ?FUNCTIONAL TESTS:  ?5 times sit to stand: unable to perform without hands, pain with stand. Completed 06/17/21: 15s ?TUG: 26s (difficulty with transfer)  Completed 06/17/21: 11.5s ? ? ?GAIT: ?Distance walked: 80ft ?Assistive device utilized: None ?Level of assistance: SBA ?Comments: decreased step length, antalgic, decreased L hip ROM, decreased heel strike  ? ? ? ?TODAY'S TREATMENT: ?Pt seen for aquatic therapy today.  Treatment took place in water 3.25-4.8 ft in depth at the [facility] pool. Temp of water was 91?.  Pt entered/exited the pool via stairs step through pattern independently with bilat rail. ? ?Warm up of forward, back and side stepping x 4 laps each; holding onto yellow noodle. ? ?Seated on 4th step:  Flutter kick 3x 20,abdct/add 3x20;   ?-STS from 3rd step x10 cues for control with eccentric lowering. Use of UE on step for momentum ? ?Holding yellow noodle: toe raises: heel raises;hip abdct; hip flex; extension x12.  Cues for core stabilization to improve balance ? ?Balance challenges: semi tandem stance: Leading r/l ue supported by 1 foam hand buoys. Holding x 20 s after several tries R/L . UE add/abd x10 once static position gained. Multiple LOB. ?  ?Pt requires buoyancy for support and to offload joints with strengthening exercises. Viscosity of the water is needed for resistance of strengthening; water current perturbations provides challenge to standing balance unsupported, requiring increased core activation.  ? ? ?PATIENT EDUCATION:  ?Education details:  MOI, diagnosis, prognosis, anatomy, exercise progression, DOMS expectations, muscle firing,  envelope of function, HEP, POC ? ?Person educated: Patient ?Education method: Explanation, Demonstration, Tactile cues, Verbal cues, and Handouts ?Education comprehension: verbalized understanding, returned demonstration, verbal cues required, and tactile cues required ? ? ?HOME EXERCISE PROGRAM: ?Access Code: [email or web address] ?URL: [email or web address] ?Date: 03/25/2021 ?Prepared by: Daleen Bo ?Exercises ?Sit to Stand with Armchair - 2 x daily - 7 x weekly - 1 sets - 10 reps ?Sitting Knee Extension with Resistance - 1 x daily - 7 x weekly - 3 sets - 10 reps ? ?ASSESSMENT: ? ?CLINICAL IMPRESSION: ?Pt feeling well without any unsteadiness.  She continues to be challenged with hip flex/ext unable to maintain her balance. Separated exercise into hip flex then hip extension with improved execution although unsteadiness continues. Continued to incorporate  balance throughout session along with le strength. She tolerates session fair. Fatigues quickly today. Began DC planning with pt. Will be given laminated HEP and ensure indep. ? ?Objective impairments include Abnormal gait, decreased activity tolerance, decreased balance, decreased endurance, decreased mobility, difficulty walking, decreased ROM, decreased strength, hypomobility, increased muscle spasms, impaired flexibility, improper body mechanics, postural dysfunction, and pain. These impairments are limiting patient from cleaning, community activity, driving, meal prep, laundry, yard work, shopping, and exercise/recreation. Personal factors including Age, Fitness, Time since onset of injury/illness/exacerbation, and 1-2 comorbidities:  are also affecting patient's functional outcome. Patient will benefit from skilled PT to address above impairments and improve overall function. ? ?REHAB POTENTIAL: Fair  ? ?CLINICAL DECISION MAKING: Evolving/moderate  complexity ? ?EVALUATION COMPLEXITY: Moderate ? ? ?GOALS: ? ? ?SHORT TERM GOALS: ? ?STG Name Target Date Goal status  ?1 Pt will become independent with HEP in order to demonstrate synthesis of PT education. ? ?Baseline:  05/06/2021 ?

## 2021-07-29 ENCOUNTER — Encounter (HOSPITAL_COMMUNITY): Payer: Medicare Other

## 2021-07-31 ENCOUNTER — Ambulatory Visit (INDEPENDENT_AMBULATORY_CARE_PROVIDER_SITE_OTHER): Payer: Medicare Other | Admitting: Family Medicine

## 2021-07-31 VITALS — BP 97/62 | HR 78 | Temp 98.1°F | Ht 65.0 in | Wt 188.8 lb

## 2021-07-31 DIAGNOSIS — M542 Cervicalgia: Secondary | ICD-10-CM

## 2021-07-31 DIAGNOSIS — G8929 Other chronic pain: Secondary | ICD-10-CM | POA: Insufficient documentation

## 2021-07-31 DIAGNOSIS — I10 Essential (primary) hypertension: Secondary | ICD-10-CM

## 2021-07-31 DIAGNOSIS — N183 Chronic kidney disease, stage 3 unspecified: Secondary | ICD-10-CM

## 2021-07-31 DIAGNOSIS — E1159 Type 2 diabetes mellitus with other circulatory complications: Secondary | ICD-10-CM | POA: Diagnosis not present

## 2021-07-31 DIAGNOSIS — E1122 Type 2 diabetes mellitus with diabetic chronic kidney disease: Secondary | ICD-10-CM | POA: Diagnosis not present

## 2021-07-31 LAB — POCT GLYCOSYLATED HEMOGLOBIN (HGB A1C): Hemoglobin A1C: 6.4 % — AB (ref 4.0–5.6)

## 2021-07-31 MED ORDER — TORSEMIDE 20 MG PO TABS
20.0000 mg | ORAL_TABLET | Freq: Two times a day (BID) | ORAL | 3 refills | Status: DC
Start: 2021-07-31 — End: 2022-07-28

## 2021-07-31 NOTE — Assessment & Plan Note (Signed)
She follows with nephrology.  Stable on most recent labs per patient.  We will refill her torsemide that she takes 20 mg twice daily for lower extremity swelling.

## 2021-07-31 NOTE — Progress Notes (Signed)
   Brandi Cooper is a 76 y.o. female who presents today for an office visit.  Assessment/Plan:  Chronic Problems Addressed Today: Chronic neck pain No red flags.  She has a longstanding history of chronic neck pain degenerative changes.  This is flared up again over the last couple of weeks.  Advised her to follow-up with sports medicine for further management.  Type 2 diabetes mellitus with stage 3 chronic kidney disease, without long-term current use of insulin (HCC) A1c is stable 6.4.  Continue glipizide 20 mg twice daily and Actos 45 mg daily.  Hypertension associated with diabetes ([place]) Blood pressure well controlled on lisinopril 20 mg daily.  CKD (chronic kidney disease) stage 3, GFR 30-59 ml/min (HCC) She follows with nephrology.  Stable on most recent labs per patient.  We will refill her torsemide that she takes 20 mg twice daily for lower extremity swelling.     Subjective:  HPI:  See A/p for status of chronic conditions.        Objective:  Physical Exam: BP 97/62 (BP Location: Left Arm)   Pulse 78   Temp 98.1 F (36.7 C) (Temporal)   Ht 5\' 5"  (1.651 m)   Wt 188 lb 12.8 oz (85.6 kg)   SpO2 98%   BMI 31.42 kg/m   Gen: No acute distress, resting comfortably CV: Regular rate and rhythm with no murmurs appreciated Pulm: Normal work of breathing, clear to auscultation bilaterally with no crackles, wheezes, or rhonchi Neuro: Grossly normal, moves all extremities Psych: Normal affect and thought content      Brandi Cooper M. Jerline Pain, MD 07/31/2021 2:36 PM

## 2021-07-31 NOTE — Assessment & Plan Note (Signed)
Blood pressure well controlled on lisinopril 20 mg daily.

## 2021-07-31 NOTE — Assessment & Plan Note (Signed)
No red flags.  She has a longstanding history of chronic neck pain degenerative changes.  This is flared up again over the last couple of weeks.  Advised her to follow-up with sports medicine for further management.

## 2021-07-31 NOTE — Patient Instructions (Addendum)
It was very nice to see you today!  Your A1c is great today.  I will send in more torsemide.  No other changes.  Please talk with Dr. Denyse Amass about your neck.  We will see back in 6 months for annual physical.  Come back sooner if needed.  Take care, Dr [name]  PLEASE NOTE:  If you had any lab tests please let us know if you have not heard back within a few days. You may see your results on mychart before we have a chance to review them but we will give you a call once they are reviewed by [place]. If we ordered any referrals today, please let us know if you have not heard from their office within the next week.   Please try these tips to maintain a healthy lifestyle:  Eat at least 3 REAL meals and 1-2 snacks per day.  Aim for no more than 5 hours between eating.  If you eat breakfast, please do so within one hour of getting up.   Each meal should contain half fruits/vegetables, one quarter protein, and one quarter carbs (no bigger than a computer mouse)  Cut down on sweet beverages. This includes juice, soda, and sweet tea.   Drink at least 1 glass of water with each meal and aim for at least 8 glasses per day  Exercise at least 150 minutes every week.

## 2021-07-31 NOTE — Assessment & Plan Note (Signed)
A1c is stable 6.4.  Continue glipizide 20 mg twice daily and Actos 45 mg daily.

## 2021-08-04 ENCOUNTER — Ambulatory Visit (HOSPITAL_BASED_OUTPATIENT_CLINIC_OR_DEPARTMENT_OTHER): Payer: Medicare Other | Admitting: Physical Therapy

## 2021-08-04 ENCOUNTER — Encounter (HOSPITAL_BASED_OUTPATIENT_CLINIC_OR_DEPARTMENT_OTHER): Payer: Self-pay | Admitting: Physical Therapy

## 2021-08-04 DIAGNOSIS — M25561 Pain in right knee: Secondary | ICD-10-CM | POA: Diagnosis not present

## 2021-08-04 DIAGNOSIS — R262 Difficulty in walking, not elsewhere classified: Secondary | ICD-10-CM

## 2021-08-04 DIAGNOSIS — M6281 Muscle weakness (generalized): Secondary | ICD-10-CM

## 2021-08-04 DIAGNOSIS — G8929 Other chronic pain: Secondary | ICD-10-CM

## 2021-08-04 NOTE — Therapy (Signed)
OUTPATIENT PHYSICAL THERAPY TREATMENT NOTE   Patient Name: Brandi Cooper MRN: [id number] DOB:04/07/1945, 76 y.o., female Today's Date: 08/04/2021  PCP: Vivi Barrack, MD REFERRING PROVIDER: Vivi Barrack, MD   PT End of Session - 08/04/21 1624     Visit Number 14    Number of Visits 21    Date for PT Re-Evaluation 08/12/21    Authorization Type BCBS Medicare    Progress Note Due on Visit 28    PT Start Time 1618    PT Stop Time 1700    PT Time Calculation (min) 42 min    Activity Tolerance Patient tolerated treatment well    Behavior During Therapy WFL for tasks assessed/performed              Past Medical History:  Diagnosis Date   Chronic kidney disease    Diabetes mellitus without complication ([place])    GERD (gastroesophageal reflux disease)    Glaucoma    Heart disease    Past Surgical History:  Procedure Laterality Date   ABDOMINAL HYSTERECTOMY     blood transfusion     BREAST SURGERY     Breast Reduction   CERVICAL FUSION     EYE SURGERY     KNEE CARTILAGE SURGERY     LUMBAR DISC SURGERY     REDUCTION MAMMAPLASTY     RETINOPATHY SURGERY     SHOULDER SURGERY Bilateral    Rotator Cuff   TRABECULECTOMY     TUBAL LIGATION     VITRECTOMY AND CATARACT     WRIST SURGERY     Carpal Tunnel   Patient Active Problem List   Diagnosis Date Noted   Chronic neck pain 07/31/2021   Osteoarthritis 01/29/2021   Vertigo 09/26/2018   Cluster headaches 09/26/2018   Dry mouth 09/26/2018   Type 2 diabetes mellitus with stage 3 chronic kidney disease, without long-term current use of insulin ([place]) 03/30/2018   CKD (chronic kidney disease) stage 3, GFR 30-59 ml/min ([place]) 03/30/2018   Hypertension associated with diabetes ([place]) 03/30/2018   Glaucoma 03/30/2018   Chronic pain of both shoulders 03/30/2018   Allergic rhinitis 03/30/2018   Anemia 03/30/2018    REFERRING DIAG: M25.561,M25.562,G89.29 (ICD-10-CM) - Chronic pain of both knees M25.571,G89.29,M25.572  (ICD-10-CM) - Chronic pain of both ankles   THERAPY DIAG:  THERAPY DIAG:  Chronic pain of right knee   Chronic pain of left knee   Muscle weakness (generalized)   Difficulty walking    PERTINENT HISTORY: Stage 3 CKD, diabetes, lumbar pain, neuropathy, glaucoma, diabetic retinopathy  PRECAUTIONS: knee  SUBJECTIVE:"I always feel better after I am finished with therapy"  PAIN:  Are you having pain? no NPRS scale: Current 0/10  PRECAUTIONS: Knee  WEIGHT BEARING RESTRICTIONS No  FALLS:  Has patient fallen in last 6 months? Yes, Number of falls: 2- picking something off the floor  LIVING ENVIRONMENT: Lives with: lives with their family Lives in: House/apartment Stairs: Yes;  Has following equipment at home: Single point cane  OCCUPATION: retired  PLOF: Independent with basic ADLs  PATIENT GOALS : pt would like to be able to walk better. She is going on a cruise this year in Oct and wants to walk freely during.    OBJECTIVE: * Findings taken at [facility] unless otherwise noted.   DIAGNOSTIC FINDINGS:  L knee FINDINGS: No acute fracture or dislocation is noted. Mild degenerative changes in the patellofemoral joint space are seen. No joint effusion is noted.   IMPRESSION:  Mild degenerative change without acute abnormality.    R knee FINDINGS: No acute fracture or dislocation is noted. No soft tissue abnormality is seen.   IMPRESSION: No acute abnormality noted.  PATIENT SURVEYS:  FOTO unable due to time   Foto completed 04/22/21: 33 -05/27/21 completed 35  COGNITION:  Overall cognitive status: Within functional limits for tasks assessed     SENSATION:  Light touch: Deficits neuropathy in legs     POSTURE:  Kyphotic, decreased lumbar lordosis, flex hip gait  PALPATION: TTP R quad and lateral joint line No TTP of L   LE AROM/PROM:  A/PROM Right 03/25/2021 Left 03/25/2021 Right 06/17/21 Left 06/17/21  Hip extension -5 -10 10 15   Knee flexion [facility]     Knee extension [facility] WFL    Ankle dorsiflexion -10 -10 4 0   (Blank rows = not tested)  LE MMT:  MMT Right 03/25/2021 Left 03/25/2021 Right 06/17/21 Left 06/17/21  Knee flexion 4+/5 4+/5 5-/5 5-/5  Knee extension 4/5 4/5 5/5 5/5   (Blank rows = not tested):   LOWER EXTREMITY SPECIAL TESTS:  Knee special tests: Anterior drawer test: negative and Varus/Valgus   FUNCTIONAL TESTS:  5 times sit to stand: unable to perform without hands, pain with stand. Completed 06/17/21: 15s TUG: 26s (difficulty with transfer)  Completed 06/17/21: 11.5s   GAIT: Distance walked: 71ft Assistive device utilized: None Level of assistance: SBA Comments: decreased step length, antalgic, decreased L hip ROM, decreased heel strike     TODAY'S TREATMENT: Pt seen for aquatic therapy today.  Treatment took place in water 3.25-4.8 ft in depth at the [facility] pool. Temp of water was 91.  Pt entered/exited the pool via stairs step through pattern independently with bilat rail.  Warm up of forward, back and side stepping x 4 laps each; holding 1 foam hand buoy  Standing gastroc stretch on bottom step 4 x 20s hold R/L Seated on 4th step:  Flutter kick 4x25,abdct/add 3x25;   -adductor sets using buoyancy ball 10 s hold x 10 -STS from 3rd step x5 with buoyancy ball between knees, then x 5 from 4th step -runners stretch R/L 4 x 20s hold Core strengthening: noodle pull down 2x10 Side lunges attempted but pt unable to coordinate.  Holding yellow noodle: toe raises: heel raises;hip abdct; hip flex; extension x12.       Pt requires buoyancy for support and to offload joints with strengthening exercises. Viscosity of the water is needed for resistance of strengthening; water current perturbations provides challenge to standing balance unsupported, requiring increased core activation.    PATIENT EDUCATION:  Education details: MOI, diagnosis, prognosis, anatomy, exercise progression, DOMS expectations,  muscle firing,  envelope of function, HEP, POC  Person educated: Patient Education method: Explanation, Demonstration, Tactile cues, Verbal cues, and Handouts Education comprehension: verbalized understanding, returned demonstration, verbal cues required, and tactile cues required   HOME EXERCISE PROGRAM: Access Code: [email or web address] URL: [email or web address] Date: 03/25/2021 Prepared by: Daleen Bo Exercises Sit to Stand with Armchair - 2 x daily - 7 x weekly - 1 sets - 10 reps Sitting Knee Extension with Resistance - 1 x daily - 7 x weekly - 3 sets - 10 reps  ASSESSMENT:  CLINICAL IMPRESSION: Pt reports soreness in right calf due to a muscle cramp yesterday.  Gastroc stretching improved. Continue to progress quad strengthening and balance.  Improved balance with STS transfers in pool, added core challenges with buoyancy ball between knee with completion. Pt is reaching  max potential. Plan to DC next visit     Objective impairments include Abnormal gait, decreased activity tolerance, decreased balance, decreased endurance, decreased mobility, difficulty walking, decreased ROM, decreased strength, hypomobility, increased muscle spasms, impaired flexibility, improper body mechanics, postural dysfunction, and pain. These impairments are limiting patient from cleaning, community activity, driving, meal prep, laundry, yard work, shopping, and exercise/recreation. Personal factors including Age, Fitness, Time since onset of injury/illness/exacerbation, and 1-2 comorbidities:  are also affecting patient's functional outcome. Patient will benefit from skilled PT to address above impairments and improve overall function.  REHAB POTENTIAL: Fair   CLINICAL DECISION MAKING: Evolving/moderate complexity  EVALUATION COMPLEXITY: Moderate   GOALS:   SHORT TERM GOALS:  STG Name Target Date Goal status  1 Pt will become independent with HEP in order to demonstrate synthesis of PT  education.  Baseline:  05/06/2021   Achieved  2 Pt will report at least 2 pt reduction on NPRS scale for pain in order to demonstrate functional improvement with household activity, self care, and ADL.  Baseline:  05/06/2021   Achieved   4 Pt will be able to demonstrate STS without UE support in order to demonstrate functional improvement in LE function for self-care and house hold duties.  Baseline: 05/06/2021  Achieved 06/17/21   LONG TERM GOALS:   LTG Name Target Date Goal status  1 Pt  will become independent with final HEP in order to demonstrate synthesis of PT education.  Baseline:  08/12/21  Ongoing   2 Pt will be able to demonstrate/report ability to walk >10 mins without pain in order to demonstrate functional improvement and tolerance to exercise and community mobility.   Baseline:  08/12/21  Achieved 06/03/21  3 Pt will be able to perform 5XSTS in under 12s  in order to demonstrate functional improvement above the cut off score for adults.  Baseline:15s 06/17/21;  07/22/21 11.56s  08/12/21  Achieved 07/22/21  4 Pt will be able to demonstrate TUG in under 10 sec in order to demonstrate functional improvement in LE function, strength, balance, and mobility for safety with community ambulation. Baseline: 11.5s 06/17/21; 8.41s 07/22/21  08/12/21  Achieved 07/22/21   PLAN: PT FREQUENCY: 1-2x/week  PT DURATION: 8 weeks   PLANNED INTERVENTIONS: Therapeutic exercises, Therapeutic activity, Neuro Muscular re-education, Balance training, Gait training, Patient/Family education, Joint mobilization, Stair training, Aquatic Therapy, Dry Needling, Electrical stimulation, Spinal mobilization, Cryotherapy, Moist heat, Manual lymph drainage, Compression bandaging, Taping, Vasopneumatic device, Traction, Ultrasound, Ionotophoresis 4mg /ml Dexamethasone, and Manual therapy  PLAN FOR NEXT SESSION: laminated HEP; DCStanton Kidney Tharon Aquas) Edin Skarda MPT  08/04/21 4:37 PM

## 2021-08-04 NOTE — Progress Notes (Unsigned)
   I, Christoper Fabian, LAT, ATC, am serving as scribe for Dr. Clementeen Graham.  Brandi Cooper is a 76 y.o. female who presents to [facility] today for f/u of B knees, B ankle pain and LBP.  She was last seen by Dr. Denyse Amass on 06/03/21 and was advised to wear compression stockings to help w/ her LE edema.  She has completed 13 PT visits.  Today, pt reports  Dx imaging: 04/22/21 L-spine XR 02/04/21 R & L knee & L ankle XR  Pertinent review of systems: ***  Relevant historical information: ***   Exam:  There were no vitals taken for this visit. General: Well Developed, well nourished, and in no acute distress.   MSK: ***    Lab and Radiology Results No results found for this or any previous visit (from the past 72 hour(s)). No results found.     Assessment and Plan: 76 y.o. female with ***   PDMP not reviewed this encounter. No orders of the defined types were placed in this encounter.  No orders of the defined types were placed in this encounter.    Discussed warning signs or symptoms. Please see discharge instructions. Patient expresses understanding.   ***

## 2021-08-05 ENCOUNTER — Ambulatory Visit (INDEPENDENT_AMBULATORY_CARE_PROVIDER_SITE_OTHER): Payer: Medicare Other | Admitting: Family Medicine

## 2021-08-05 ENCOUNTER — Ambulatory Visit (INDEPENDENT_AMBULATORY_CARE_PROVIDER_SITE_OTHER): Payer: Medicare Other

## 2021-08-05 ENCOUNTER — Ambulatory Visit (HOSPITAL_COMMUNITY)
Admission: RE | Admit: 2021-08-05 | Discharge: 2021-08-05 | Disposition: A | Discharge status: Home / Self Care | Payer: Medicare Other | Source: Ambulatory Visit | Attending: Nephrology | Admitting: Nephrology

## 2021-08-05 VITALS — BP 112/53 | HR 88 | Temp 97.3°F | Resp 18

## 2021-08-05 VITALS — BP 126/70 | HR 71 | Ht 65.0 in | Wt 190.0 lb

## 2021-08-05 DIAGNOSIS — M542 Cervicalgia: Secondary | ICD-10-CM | POA: Diagnosis not present

## 2021-08-05 DIAGNOSIS — N183 Chronic kidney disease, stage 3 unspecified: Secondary | ICD-10-CM | POA: Diagnosis present

## 2021-08-05 LAB — IRON AND TIBC
Iron: 85 ug/dL (ref 28–170)
Saturation Ratios: 27 % (ref 10.4–31.8)
TIBC: 314 ug/dL (ref 250–450)
UIBC: 229 ug/dL

## 2021-08-05 LAB — POCT HEMOGLOBIN-HEMACUE: Hemoglobin: 11.7 g/dL — ABNORMAL LOW (ref 12.0–15.0)

## 2021-08-05 LAB — FERRITIN: Ferritin: 442 ng/mL — ABNORMAL HIGH (ref 11–307)

## 2021-08-05 MED ORDER — EPOETIN ALFA-EPBX 10000 UNIT/ML IJ SOLN
15000.0000 [IU] | INTRAMUSCULAR | Status: DC
  Administered 2021-08-05: 15000 [IU] via SUBCUTANEOUS

## 2021-08-05 MED ORDER — TIZANIDINE HCL 2 MG PO TABS: 2.0000 mg | ORAL_TABLET | Freq: Three times a day (TID) | ORAL | 1 refills | Status: DC | PRN

## 2021-08-05 MED ORDER — EPOETIN ALFA-EPBX 10000 UNIT/ML IJ SOLN
INTRAMUSCULAR | Status: AC
  Filled 2021-08-05: qty 2

## 2021-08-05 NOTE — Patient Instructions (Addendum)
Good to see you   Please get an Xray today before you leave   I've referred you to Physical Therapy.  Let us know if you don't hear from them in one week.   Heating pad for the neck and shoulders.   Recheck with me as needed.  TENS UNIT: This is helpful for muscle pain and spasm.   Search and Purchase a TENS 7000 2nd edition at  www.tenspros.com or www.Amazon.com It should be less than $30.     TENS unit instructions: Do not shower or bathe with the unit on Turn the unit off before removing electrodes or batteries If the electrodes lose stickiness add a drop of water to the electrodes after they are disconnected from the unit and place on plastic sheet. If you continued to have difficulty, call the TENS unit company to purchase more electrodes. Do not apply lotion on the skin area prior to use. Make sure the skin is clean and dry as this will help prolong the life of the electrodes. After use, always check skin for unusual red areas, rash or other skin difficulties. If there are any skin problems, does not apply electrodes to the same area. Never remove the electrodes from the unit by pulling the wires. Do not use the TENS unit or electrodes other than as directed. Do not change electrode placement without consultating your therapist or physician. Keep 2 fingers with between each electrode. Wear time ratio is 2:1, on to off times.    For example on for 30 minutes off for 15 minutes and then on for 30 minutes off for 15 minutes

## 2021-08-07 NOTE — Progress Notes (Signed)
Cervical spine x-ray shows a good appearing cervical fusion of C3-C7.  There is some arthritis changes at C7-T1.  No fractures are present.

## 2021-08-11 ENCOUNTER — Ambulatory Visit (HOSPITAL_BASED_OUTPATIENT_CLINIC_OR_DEPARTMENT_OTHER): Payer: Medicare Other | Admitting: Physical Therapy

## 2021-08-11 ENCOUNTER — Encounter (HOSPITAL_BASED_OUTPATIENT_CLINIC_OR_DEPARTMENT_OTHER): Payer: Self-pay | Admitting: Physical Therapy

## 2021-08-11 DIAGNOSIS — M25561 Pain in right knee: Secondary | ICD-10-CM | POA: Diagnosis not present

## 2021-08-11 DIAGNOSIS — M6281 Muscle weakness (generalized): Secondary | ICD-10-CM

## 2021-08-11 DIAGNOSIS — R262 Difficulty in walking, not elsewhere classified: Secondary | ICD-10-CM

## 2021-08-11 DIAGNOSIS — M25562 Pain in left knee: Secondary | ICD-10-CM

## 2021-08-11 DIAGNOSIS — G8929 Other chronic pain: Secondary | ICD-10-CM

## 2021-08-11 NOTE — Therapy (Signed)
OUTPATIENT PHYSICAL THERAPY TREATMENT NOTE  PHYSICAL THERAPY DISCHARGE SUMMARY  Visits from Start of Care: 15  Current functional level related to goals / functional outcomes: Indep with all ADL's and functional mobility   Remaining deficits: New cervical pain   Education / Equipment: Management of condition (for knee pain), HEP  Patient agrees to discharge. Patient goals were met. Patient is being discharged due to meeting the stated rehab goals.  Patient Name: Brandi Cooper MRN: [id number] DOB:1945-11-09, 76 y.o., female Today's Date: 08/11/2021  PCP: Vivi Barrack, MD REFERRING PROVIDER: Vivi Barrack, MD   PT End of Session - 08/11/21 1833     Visit Number 15    Number of Visits 21    Date for PT Re-Evaluation 08/12/21    Authorization Type BCBS Medicare    Progress Note Due on Visit 36    PT Start Time 1620    PT Stop Time 1704    PT Time Calculation (min) 44 min    Activity Tolerance Patient tolerated treatment well    Behavior During Therapy WFL for tasks assessed/performed               Past Medical History:  Diagnosis Date   Chronic kidney disease    Diabetes mellitus without complication ([place])    GERD (gastroesophageal reflux disease)    Glaucoma    Heart disease    Past Surgical History:  Procedure Laterality Date   ABDOMINAL HYSTERECTOMY     blood transfusion     BREAST SURGERY     Breast Reduction   CERVICAL FUSION     EYE SURGERY     KNEE CARTILAGE SURGERY     LUMBAR DISC SURGERY     REDUCTION MAMMAPLASTY     RETINOPATHY SURGERY     SHOULDER SURGERY Bilateral    Rotator Cuff   TRABECULECTOMY     TUBAL LIGATION     VITRECTOMY AND CATARACT     WRIST SURGERY     Carpal Tunnel   Patient Active Problem List   Diagnosis Date Noted   Chronic neck pain 07/31/2021   Osteoarthritis 01/29/2021   Vertigo 09/26/2018   Cluster headaches 09/26/2018   Dry mouth 09/26/2018   Type 2 diabetes mellitus with stage 3 chronic kidney  disease, without long-term current use of insulin ([place]) 03/30/2018   CKD (chronic kidney disease) stage 3, GFR 30-59 ml/min ([place]) 03/30/2018   Hypertension associated with diabetes ([place]) 03/30/2018   Glaucoma 03/30/2018   Chronic pain of both shoulders 03/30/2018   Allergic rhinitis 03/30/2018   Anemia 03/30/2018    REFERRING DIAG: M25.561,M25.562,G89.29 (ICD-10-CM) - Chronic pain of both knees M25.571,G89.29,M25.572 (ICD-10-CM) - Chronic pain of both ankles   THERAPY DIAG:  THERAPY DIAG:  Chronic pain of right knee   Chronic pain of left knee   Muscle weakness (generalized)   Difficulty walking    PERTINENT HISTORY: Stage 3 CKD, diabetes, lumbar pain, neuropathy, glaucoma, diabetic retinopathy  PRECAUTIONS: knee  SUBJECTIVE:"Saw Dr Georgina Snell. Had xray of neck.  Fusion is fine muscle just tight."  PAIN:  Are you having pain? no NPRS scale: Current 0/10  PRECAUTIONS: Knee  WEIGHT BEARING RESTRICTIONS No  FALLS:  Has patient fallen in last 6 months? Yes, Number of falls: 2- picking something off the floor  LIVING ENVIRONMENT: Lives with: lives with their family Lives in: House/apartment Stairs: Yes;  Has following equipment at home: Single point cane  OCCUPATION: retired  PLOF: Independent with basic ADLs  PATIENT  GOALS : pt would like to be able to walk better. She is going on a cruise this year in Oct and wants to walk freely during.    OBJECTIVE: * Findings [facility] at [facility] unless otherwise noted.   DIAGNOSTIC FINDINGS:  L knee FINDINGS: No acute fracture or dislocation is noted. Mild degenerative changes in the patellofemoral joint space are seen. No joint effusion is noted.   IMPRESSION: Mild degenerative change without acute abnormality.    R knee FINDINGS: No acute fracture or dislocation is noted. No soft tissue abnormality is seen.   IMPRESSION: No acute abnormality noted.  PATIENT SURVEYS:  FOTO unable due to time   Foto completed 04/22/21:  33 -05/27/21 completed 35  COGNITION:  Overall cognitive status: Within functional limits for tasks assessed     SENSATION:  Light touch: Deficits neuropathy in legs     POSTURE:  Kyphotic, decreased lumbar lordosis, flex hip gait  PALPATION: TTP R quad and lateral joint line No TTP of L   LE AROM/PROM:  A/PROM Right 03/25/2021 Left 03/25/2021 Right 06/17/21 Left 06/17/21  Hip extension -5 -_0 Knee flexion [facility]    Knee extension [facility] WFL    Ankle dorsiflexion -10 -10 4 0   (Blank rows = not tested)  LE MMT:  MMT Right 03/25/2021 Left 03/25/2021 Right 06/17/21 Left 06/17/21  Knee flexion 4+/5 4+/5 5-/5 5-/5  Knee extension 4/5 4/5 5/5 5/5   (Blank rows = not tested):   LOWER EXTREMITY SPECIAL TESTS:  Knee special tests: Anterior drawer test: negative and Varus/Valgus   FUNCTIONAL TESTS:  5 times sit to stand: unable to perform without hands, pain with stand. Completed 06/17/21: 15s TUG: 26s (difficulty with transfer)  Completed 06/17/21: 11.5s   GAIT: Distance walked: 50f Assistive device utilized: None Level of assistance: SBA Comments: decreased step length, antalgic, decreased L hip ROM, decreased heel strike     TODAY'S TREATMENT: Pt seen for aquatic therapy today.  Treatment took place in water 3.25-4.8 ft [facility] Temp of water was 91.  Pt entered/exited the pool via stairs step through pattern independently with bilat rail.  Warm up of forward, back and side stepping x 4 laps each; holding 1 foam hand buoy  Standing gastroc stretch on bottom step 4 x 20s hold R/L Seated on 4th step:  Flutter kick 4x25,abdct/add 3x25;   -adductor sets using buoyancy ball 10 s hold x 10 -STS from 3rd step x5 with buoyancy ball between knees, then x 5 from 4th step -runners stretch R/L 4 x 20s hold Core strengthening: noodle pull down 2x10 Side lunges attempted but pt unable to coordinate.  Holding yellow noodle: toe raises: heel  raises;hip abdct; hip flex; extension x12.     Pt requires buoyancy for support and to offload joints with strengthening exercises. Viscosity of the water is needed for resistance of strengthening; water current perturbations provides challenge to standing balance unsupported, requiring increased core activation.    PATIENT EDUCATION:  Education details: MOI, diagnosis, prognosis, anatomy, exercise progression, DOMS expectations, muscle firing,  envelope of function, HEP, POC  Person educated: Patient Education method: Explanation, Demonstration, Tactile cues, Verbal cues, and Handouts Education comprehension: verbalized understanding, returned demonstration, verbal cues required, and tactile cues required   HOME EXERCISE PROGRAM: Access Code: [email or web address]: [email or web address] Date: 03/25/2021 Prepared by: ADaleen BoExercises Sit to Stand with Armchair - 2 x daily - 7 x weekly - 1 sets -  10 reps Sitting Knee Extension with Resistance - 1 x daily - 7 x weekly - 3 sets - 10 reps  Added Aquatics 08/11/21 Access Code: [email or web address] URL: [email or web address] Date: 08/11/2021 Prepared by: Denton Meek  ASSESSMENT:  CLINICAL IMPRESSION: Pt received and demonstrates indep with laminated HEP.  She has met all goals set an initial evaluation and is ready for DC.  She did receive new referral for cervical spine pain which she is declining an assessment for now as she wants "to take a break" from therapy. Says her neck hasn't been bothering her this week. She will call when?if she decides it is necessary.     Objective impairments include Abnormal gait, decreased activity tolerance, decreased balance, decreased endurance, decreased mobility, difficulty walking, decreased ROM, decreased strength, hypomobility, increased muscle spasms, impaired flexibility, improper body mechanics, postural dysfunction, and pain. These impairments are limiting patient from cleaning,  community activity, driving, meal prep, laundry, yard work, shopping, and exercise/recreation. Personal factors including Age, Fitness, Time since onset of injury/illness/exacerbation, and 1-2 comorbidities:  are also affecting patient's functional outcome. Patient will benefit from skilled PT to address above impairments and improve overall function.  REHAB POTENTIAL: Fair   CLINICAL DECISION MAKING: Evolving/moderate complexity  EVALUATION COMPLEXITY: Moderate   GOALS:   SHORT TERM GOALS:  STG Name Target Date Goal status  1 Pt will become independent with HEP in order to demonstrate synthesis of PT education.  Baseline:  05/06/2021   Achieved  2 Pt will report at least 2 pt reduction on NPRS scale for pain in order to demonstrate functional improvement with household activity, self care, and ADL.  Baseline:  05/06/2021   Achieved   4 Pt will be able to demonstrate STS without UE support in order to demonstrate functional improvement in LE function for self-care and house hold duties.  Baseline: 05/06/2021  Achieved 06/17/21   LONG TERM GOALS:   LTG Name Target Date Goal status  1 Pt  will become independent with final HEP in order to demonstrate synthesis of PT education.  Baseline:  08/12/21  Achieved  2 Pt will be able to demonstrate/report ability to walk >10 mins without pain in order to demonstrate functional improvement and tolerance to exercise and community mobility.   Baseline:  08/12/21  Achieved 06/03/21  3 Pt will be able to perform 5XSTS in under 12s  in order to demonstrate functional improvement above the cut off score for adults.  Baseline:15s 06/17/21;  07/22/21 11.56s  08/12/21  Achieved 07/22/21  4 Pt will be able to demonstrate TUG in under 10 sec in order to demonstrate functional improvement in LE function, strength, balance, and mobility for safety with community ambulation. Baseline: 11.5s 06/17/21; 8.41s 07/22/21  08/12/21  Achieved 07/22/21    PLAN: PT FREQUENCY: 1-2x/week  PT DURATION: 8 weeks   PLANNED INTERVENTIONS: Therapeutic exercises, Therapeutic activity, Neuro Muscular re-education, Balance training, Gait training, Patient/Family education, Joint mobilization, Stair training, Aquatic Therapy, Dry Needling, Electrical stimulation, Spinal mobilization, Cryotherapy, Moist heat, Manual lymph drainage, Compression bandaging, Taping, Vasopneumatic device, Traction, Ultrasound, Ionotophoresis 33m/ml Dexamethasone, and Manual therapy  PLAN FOR NEXT SESSION: laminated HEP; DC.Stanton Kidney(Tharon Aquas Lynel Forester MPT  08/11/21 6:41 PM

## 2021-08-17 ENCOUNTER — Telehealth: Payer: Self-pay | Admitting: Family Medicine

## 2021-08-17 MED ORDER — LISINOPRIL 20 MG PO TABS: ORAL_TABLET | ORAL | 1 refills | Status: DC

## 2021-08-17 MED ORDER — PIOGLITAZONE HCL 45 MG PO TABS: ORAL_TABLET | ORAL | 1 refills | Status: DC

## 2021-08-17 NOTE — Telephone Encounter (Signed)
Pt is asking why these two do not have refill. Please call with an update.    LAST APPOINTMENT DATE:   07/31/21   NEXT APPOINTMENT DATE: 02/10/22   MEDICATION: lisinopril (ZESTRIL) 20 MG tablet [[id number]]   pioglitazone (ACTOS) 45 MG tablet [[id number]]   Is the patient out of medication?  Actos will run out on Wednesday (08/19/21) Zestril will run out in one week  PHARMACY: [id number] [place], [place] - [address] AT [facility] [address] [facility]  [address], [place] [address]  Phone:  [phone]  Fax:  [phone]

## 2021-08-17 NOTE — Telephone Encounter (Signed)
Spoke to pt told her Rx's were sent to pharmacy. Pt verbalized understanding.

## 2021-08-26 ENCOUNTER — Encounter (HOSPITAL_COMMUNITY): Payer: Medicare Other

## 2021-09-02 ENCOUNTER — Ambulatory Visit (HOSPITAL_COMMUNITY)
Admission: RE | Admit: 2021-09-02 | Discharge: 2021-09-02 | Disposition: A | Discharge status: Home / Self Care | Payer: Medicare Other | Source: Ambulatory Visit | Attending: Nephrology | Admitting: Nephrology

## 2021-09-02 VITALS — BP 103/58 | HR 62 | Temp 98.9°F | Resp 18

## 2021-09-02 DIAGNOSIS — N183 Chronic kidney disease, stage 3 unspecified: Secondary | ICD-10-CM | POA: Insufficient documentation

## 2021-09-02 LAB — IRON AND TIBC
Iron: 70 ug/dL (ref 28–170)
Saturation Ratios: 21 % (ref 10.4–31.8)
TIBC: 326 ug/dL (ref 250–450)
UIBC: 256 ug/dL

## 2021-09-02 LAB — FERRITIN: Ferritin: 417 ng/mL — ABNORMAL HIGH (ref 11–307)

## 2021-09-02 LAB — POCT HEMOGLOBIN-HEMACUE: Hemoglobin: 11 g/dL — ABNORMAL LOW (ref 12.0–15.0)

## 2021-09-02 MED ORDER — EPOETIN ALFA-EPBX 10000 UNIT/ML IJ SOLN
INTRAMUSCULAR | Status: AC
  Filled 2021-09-02: qty 2

## 2021-09-02 MED ORDER — EPOETIN ALFA-EPBX 10000 UNIT/ML IJ SOLN
15000.0000 [IU] | INTRAMUSCULAR | Status: DC
  Administered 2021-09-02: 15000 [IU] via SUBCUTANEOUS

## 2021-09-30 ENCOUNTER — Ambulatory Visit (HOSPITAL_COMMUNITY)
Admission: RE | Admit: 2021-09-30 | Discharge: 2021-09-30 | Disposition: A | Discharge status: Home / Self Care | Payer: Medicare Other | Source: Ambulatory Visit | Attending: Nephrology | Admitting: Nephrology

## 2021-09-30 VITALS — BP 116/49 | HR 72 | Temp 97.8°F | Resp 18

## 2021-09-30 DIAGNOSIS — N183 Chronic kidney disease, stage 3 unspecified: Secondary | ICD-10-CM | POA: Diagnosis present

## 2021-09-30 LAB — IRON AND TIBC
Iron: 73 ug/dL (ref 28–170)
Saturation Ratios: 24 % (ref 10.4–31.8)
TIBC: 311 ug/dL (ref 250–450)
UIBC: 238 ug/dL

## 2021-09-30 LAB — POCT HEMOGLOBIN-HEMACUE: Hemoglobin: 11.2 g/dL — ABNORMAL LOW (ref 12.0–15.0)

## 2021-09-30 LAB — FERRITIN: Ferritin: 439 ng/mL — ABNORMAL HIGH (ref 11–307)

## 2021-09-30 MED ORDER — EPOETIN ALFA 20000 UNIT/ML IJ SOLN
15000.0000 [IU] | Freq: Once | INTRAMUSCULAR | Status: AC
  Administered 2021-09-30: 15000 [IU] via SUBCUTANEOUS

## 2021-09-30 MED ORDER — EPOETIN ALFA-EPBX 10000 UNIT/ML IJ SOLN: 15000.0000 [IU] | INTRAMUSCULAR | Status: DC

## 2021-09-30 MED ORDER — EPOETIN ALFA 20000 UNIT/ML IJ SOLN
INTRAMUSCULAR | Status: AC
  Filled 2021-09-30: qty 1

## 2021-10-09 ENCOUNTER — Other Ambulatory Visit: Payer: Self-pay | Admitting: *Deleted

## 2021-10-09 MED ORDER — GLIPIZIDE 10 MG PO TABS: ORAL_TABLET | ORAL | 0 refills | Status: DC

## 2021-10-28 ENCOUNTER — Ambulatory Visit (HOSPITAL_COMMUNITY)
Admission: RE | Admit: 2021-10-28 | Discharge: 2021-10-28 | Disposition: A | Discharge status: Home / Self Care | Payer: Medicare Other | Source: Ambulatory Visit | Attending: Nephrology | Admitting: Nephrology

## 2021-10-28 VITALS — BP 112/56 | HR 66 | Temp 97.0°F

## 2021-10-28 DIAGNOSIS — N183 Chronic kidney disease, stage 3 unspecified: Secondary | ICD-10-CM | POA: Diagnosis present

## 2021-10-28 LAB — POCT HEMOGLOBIN-HEMACUE: Hemoglobin: 11.3 g/dL — ABNORMAL LOW (ref 12.0–15.0)

## 2021-10-28 LAB — IRON AND TIBC
Iron: 81 ug/dL (ref 28–170)
Saturation Ratios: 26 % (ref 10.4–31.8)
TIBC: 309 ug/dL (ref 250–450)
UIBC: 228 ug/dL

## 2021-10-28 LAB — FERRITIN: Ferritin: 372 ng/mL — ABNORMAL HIGH (ref 11–307)

## 2021-10-28 MED ORDER — EPOETIN ALFA 20000 UNIT/ML IJ SOLN
INTRAMUSCULAR | Status: AC
  Filled 2021-10-28: qty 1

## 2021-10-28 MED ORDER — EPOETIN ALFA-EPBX 10000 UNIT/ML IJ SOLN: 15000.0000 [IU] | INTRAMUSCULAR | Status: DC

## 2021-10-28 MED ORDER — EPOETIN ALFA 20000 UNIT/ML IJ SOLN
15000.0000 [IU] | INTRAMUSCULAR | Status: DC
  Administered 2021-10-28: 15000 [IU] via SUBCUTANEOUS

## 2021-11-25 ENCOUNTER — Ambulatory Visit (HOSPITAL_COMMUNITY)
Admission: RE | Admit: 2021-11-25 | Discharge: 2021-11-25 | Disposition: A | Discharge status: Home / Self Care | Payer: Medicare Other | Source: Ambulatory Visit | Attending: Nephrology | Admitting: Nephrology

## 2021-11-25 VITALS — BP 109/59 | HR 63 | Temp 97.0°F | Resp 18

## 2021-11-25 DIAGNOSIS — N183 Chronic kidney disease, stage 3 unspecified: Secondary | ICD-10-CM | POA: Diagnosis present

## 2021-11-25 LAB — IRON AND TIBC
Iron: 79 ug/dL (ref 28–170)
Saturation Ratios: 27 % (ref 10.4–31.8)
TIBC: 288 ug/dL (ref 250–450)
UIBC: 209 ug/dL

## 2021-11-25 LAB — FERRITIN: Ferritin: 351 ng/mL — ABNORMAL HIGH (ref 11–307)

## 2021-11-25 MED ORDER — EPOETIN ALFA-EPBX 10000 UNIT/ML IJ SOLN
15000.0000 [IU] | INTRAMUSCULAR | Status: DC
  Administered 2021-11-25: 15000 [IU] via SUBCUTANEOUS

## 2021-11-25 MED ORDER — EPOETIN ALFA-EPBX 10000 UNIT/ML IJ SOLN
INTRAMUSCULAR | Status: AC
  Filled 2021-11-25: qty 2

## 2021-11-26 LAB — POCT HEMOGLOBIN-HEMACUE: Hemoglobin: 11.3 g/dL — ABNORMAL LOW (ref 12.0–15.0)

## 2021-12-07 ENCOUNTER — Ambulatory Visit (INDEPENDENT_AMBULATORY_CARE_PROVIDER_SITE_OTHER): Payer: Medicare Other | Admitting: Internal Medicine

## 2021-12-07 ENCOUNTER — Encounter: Payer: Self-pay | Admitting: Internal Medicine

## 2021-12-07 VITALS — BP 134/67 | HR 86 | Temp 97.9°F | Resp 14 | Ht 65.0 in | Wt 186.8 lb

## 2021-12-07 DIAGNOSIS — J020 Streptococcal pharyngitis: Secondary | ICD-10-CM

## 2021-12-07 DIAGNOSIS — J029 Acute pharyngitis, unspecified: Secondary | ICD-10-CM

## 2021-12-07 LAB — POCT RAPID STREP A (OFFICE): Rapid Strep A Screen: POSITIVE — AB

## 2021-12-07 MED ORDER — AMOXICILLIN-POT CLAVULANATE 875-125 MG PO TABS: 1.0000 | ORAL_TABLET | Freq: Two times a day (BID) | ORAL | 0 refills | Status: DC

## 2021-12-07 NOTE — Progress Notes (Signed)
[name]:  [phone]   Routine Medical Office Visit  Patient:  Brandi Cooper      Age: 76 y.o.       Sex:  female  Date:   12/07/2021  PCP:    Vivi Barrack, MD    [place] Provider: Loralee Pacas, MD  Assessment/Plan:   Brandi Cooper was seen today for sore throat.  Strep pharyngitis -     Amoxicillin-Pot Clavulanate; Take 1 tablet by mouth 2 (two) times daily.  Dispense: 14 tablet; Refill: 0  Sore throat -     POCT rapid strep A     Return if symptoms worsen or fail to improve.   Subjective:   Brandi Cooper is a 76 y.o. female with PMH significant for: Past Medical History:  Diagnosis Date   Chronic kidney disease    Diabetes mellitus without complication ([facility])    GERD (gastroesophageal reflux disease)    Glaucoma    Heart disease      She main concern for today's visit is: Chief Complaint  Patient presents with   Sore Throat    The sore throat started yesterday morning been coughing a little bit but not productive she denies any fevers headaches swelling in throat or lumps in the throat but has had some difficulty swallowing difficulty breathing. No penicillin allergy she has had sore throat repeatedly she has no history of ear infections and has some discomfort in her ears         Objective:  Physical Exam: BP 134/67 (BP Location: Left Arm, Patient Position: Sitting)   Pulse 86   Temp 97.9 F (36.6 C) (Temporal)   Resp 14   Ht 5\' 5"  (1.651 m)   Wt 186 lb 12.8 oz (84.7 kg)   SpO2 94%   BMI 31.09 kg/m   She  is a polite, friendly, and genuine person Constitutional: NAD, AAO, not ill-appearing  Neuro: alert, no focal deficit obvious, articulate speech Psych: normal mood, behavior, thought content   Problem specific physical exam findings:  Tm scarring No tonsillar hypertrophy or exudate Inflamed pharynx  Results:  Results for orders placed or performed in visit on 12/07/21  POCT rapid strep A   Result Value Ref Range   Rapid Strep A Screen Positive (A) Negative     Recent Results (from the past 2160 hour(s))  Hemoglobin-hemacue, POC     Status: Abnormal   Collection Time: 09/30/21  9:07 AM  Result Value Ref Range   Hemoglobin 11.2 (L) 12.0 - 15.0 g/dL  Iron and TIBC     Status: None   Collection Time: 09/30/21  9:43 AM  Result Value Ref Range   Iron 73 28 - 170 ug/dL   TIBC 311 250 - 450 ug/dL   Saturation Ratios 24 10.4 - 31.8 %   UIBC 238 ug/dL    Comment: Performed at [facility], [address] [place], [place] [address]  Ferritin     Status: Abnormal   Collection Time: 09/30/21  9:43 AM  Result Value Ref Range   Ferritin 439 (H) 11 - 307 ng/mL    Comment: Performed at [facility], [place] [address], [place], [place] [address]  Hemoglobin-hemacue, POC     Status: Abnormal   Collection Time: 10/28/21  9:12 AM  Result Value Ref Range   Hemoglobin 11.3 (L) 12.0 - 15.0 g/dL  Iron and TIBC     Status: None   Collection  Time: 10/28/21  9:33 AM  Result Value Ref Range   Iron 81 28 - 170 ug/dL   TIBC 062 694 - 854 ug/dL   Saturation Ratios 26 10.4 - 31.8 %   UIBC 228 ug/dL    Comment: Performed at [facility], [address], [place], [place] [address]  Ferritin     Status: Abnormal   Collection Time: 10/28/21  9:33 AM  Result Value Ref Range   Ferritin 372 (H) 11 - 307 ng/mL    Comment: Performed at [facility], [address] [place], [place] [address]  Iron and TIBC     Status: None   Collection Time: 11/25/21  9:16 AM  Result Value Ref Range   Iron 79 28 - 170 ug/dL   TIBC 818 299 - 371 ug/dL   Saturation Ratios 27 10.4 - 31.8 %   UIBC 209 ug/dL    Comment: Performed at [facility], [address], [place], [place] [address]  Ferritin     Status: Abnormal   Collection Time: 11/25/21  9:16 AM  Result Value Ref Range   Ferritin 351 (H) 11 - 307 ng/mL    Comment: Performed at [facility], [address]  [place], [place] [address]  Hemoglobin-hemacue, POC     Status: Abnormal   Collection Time: 11/25/21  9:20 AM  Result Value Ref Range   Hemoglobin 11.3 (L) 12.0 - 15.0 g/dL  POCT rapid strep A     Status: Abnormal   Collection Time: 12/07/21  4:40 PM  Result Value Ref Range   Rapid Strep A Screen Positive (A) Negative

## 2021-12-07 NOTE — Patient Instructions (Addendum)
It was a pleasure seeing you today!  Today the plan is...  Strep pharyngitis -     Amoxicillin-Pot Clavulanate; Take 1 tablet by mouth 2 (two) times daily.  Dispense: 14 tablet; Refill: 0  Sore throat -     POCT rapid strep A     Loralee Pacas, MD   Return if symptoms worsen or fail to improve.     Strep Throat, Adult Strep throat is an infection in the throat that is caused by bacteria. It is common during the cold months of the year. It mostly affects children who are 29-74 years old. However, people of all ages can get it at any time of the year. This infection spreads from person to person (is contagious) through coughing, sneezing, or having close contact. Your health care provider may use other names to describe the infection. When strep throat affects the tonsils, it is called tonsillitis. When it affects the back of the throat, it is called pharyngitis. What are the causes? This condition is caused by the Streptococcus pyogenes bacteria. What increases the risk? You are more likely to develop this condition if: You care for school-age children, or are around school-age children. Children are more likely to get strep throat and may spread it to others. You spend time in crowded places where the infection can spread easily. You have close contact with someone who has strep throat. What are the signs or symptoms? Symptoms of this condition include: Fever or chills. Redness, swelling, or pain in the tonsils or throat. Pain or difficulty when swallowing. White or yellow spots on the tonsils or throat. Tender glands in the neck and under the jaw. Bad smelling breath. Red rash all over the body. This is rare. How is this diagnosed? This condition is diagnosed by tests that check for the presence and the amount of bacteria that cause strep throat. They are: Rapid strep test. Your throat is swabbed and checked for the presence of bacteria. Results are usually ready in  minutes. Throat culture test. Your throat is swabbed. The sample is placed in a cup that allows infections to grow. Results are usually ready in 1 or 2 days. How is this treated? This condition may be treated with: Medicines that kill germs (antibiotics). Medicines that relieve pain or fever. These include: Ibuprofen or acetaminophen. Aspirin, only for people who are over the age of 51. Throat lozenges. Throat sprays. Follow these instructions at home: Medicines  Take over-the-counter and prescription medicines only as told by your health care provider. Take your antibiotic medicine as told by your health care provider. Do not stop taking the antibiotic even if you start to feel better. Eating and drinking  If you have trouble swallowing, try eating soft foods until your sore throat feels better. Drink enough fluid to keep your urine pale yellow. To help relieve pain, you may have: Warm fluids, such as soup and tea. Cold fluids, such as frozen desserts or popsicles. General instructions Gargle with a salt-water mixture 3-4 times a day or as needed. To make a salt-water mixture, completely dissolve -1 tsp (3-6 g) of salt in 1 cup (237 mL) of warm water. Get plenty of rest. Stay home from work or school until you have been taking antibiotics for 24 hours. Do not use any products that contain nicotine or tobacco. These products include cigarettes, chewing tobacco, and vaping devices, such as e-cigarettes. If you need help quitting, ask your health care provider. It is up to  you to get your test results. Ask your health care provider, or the department that is doing the test, when your results will be ready. Keep all follow-up visits. This is important. How is this prevented?  Do not share food, drinking cups, or personal items that could cause the infection to spread to other people. Wash your hands often with soap and water for at least 20 seconds. If soap and water are not available,  use hand sanitizer. Make sure that all people in your house wash their hands well. Have family members tested if they have a sore throat or fever. They may need an antibiotic if they have strep throat. Contact a health care provider if: You have swelling in your neck that keeps getting bigger. You develop a rash, cough, or earache. You cough up a thick mucus that is green, yellow-brown, or bloody. You have pain or discomfort that does not get better with medicine. Your symptoms seem to be getting worse. You have a fever. Get help right away if: You have new symptoms, such as vomiting, severe headache, stiff or painful neck, chest pain, or shortness of breath. You have severe throat pain, drooling, or changes in your voice. You have swelling of the neck, or the skin on the neck becomes red and tender. You have signs of dehydration, such as tiredness (fatigue), dry mouth, and decreased urination. You become increasingly sleepy, or you cannot wake up completely. Your joints become red or painful. These symptoms may represent a serious problem that is an emergency. Do not wait to see if the symptoms will go away. Get medical help right away. Call your local emergency services (911 in the [place] Do not drive yourself to the hospital. Summary Strep throat is an infection in the throat that is caused by the Streptococcus pyogenes bacteria. This infection is spread from person to person (is contagious) through coughing, sneezing, or having close contact. Take your medicines, including antibiotics, as told by your health care provider. Do not stop taking the antibiotic even if you start to feel better. To prevent the spread of germs, wash your hands well with soap and water. Have others do the same. Do not share food, drinking cups, or personal items. Get help right away if you have new symptoms, such as vomiting, severe headache, stiff or painful neck, chest pain, or shortness of breath. This  information is not intended to replace advice given to you by your health care provider. Make sure you discuss any questions you have with your health care provider. Document Revised: 06/24/2020 Document Reviewed: 06/24/2020 Elsevier Patient Education  2023 [employer].

## 2021-12-09 ENCOUNTER — Telehealth: Payer: Self-pay | Admitting: Family Medicine

## 2021-12-09 NOTE — Telephone Encounter (Signed)
Caller States: -pt is experiencing chest pain "hurts really bad" -pt wants to know what she can take for the pain. -pt was seen on 12/04/21 for strep -pt is diabetic and in kidney failure. -Would like to speak with the on-call nurse   Referred to PCP triage nurse, warm transferred to [facility], patient coordinator.

## 2021-12-10 ENCOUNTER — Ambulatory Visit (INDEPENDENT_AMBULATORY_CARE_PROVIDER_SITE_OTHER): Payer: Medicare Other

## 2021-12-10 VITALS — BP 100/64 | HR 77 | Temp 97.6°F | Wt 186.4 lb

## 2021-12-10 DIAGNOSIS — Z0000 Encounter for general adult medical examination without abnormal findings: Secondary | ICD-10-CM

## 2021-12-10 LAB — COLOGUARD: Cologuard: NEGATIVE

## 2021-12-10 NOTE — Patient Instructions (Signed)
Brandi Cooper , Thank you for taking time to come for your Medicare Wellness Visit. I appreciate your ongoing commitment to your health goals. Please review the following plan we discussed and let me know if I can assist you in the future.   These are the goals we discussed:  Goals      Patient Stated     [facility]     Patient Stated     None at this time        This is a list of the screening recommended for you and due dates:  Health Maintenance  Topic Date Due   Yearly kidney health urinalysis for diabetes  Never done   Hepatitis C Screening: USPSTF Recommendation to screen - Ages 63-79 yo.  Never done   Complete foot exam   09/26/2019   Eye exam for diabetics  07/23/2021   Tetanus Vaccine  09/25/2028*   Hemoglobin A1C  01/31/2022   Yearly kidney function blood test for diabetes  03/04/2022   Colon Cancer Screening  12/04/2025   DEXA scan (bone density measurement)  Completed   HPV Vaccine  Aged Out   Pneumonia Vaccine  Discontinued   Flu Shot  Discontinued   COVID-19 Vaccine  Discontinued   Zoster (Shingles) Vaccine  Discontinued  *Topic was postponed. The date shown is not the original due date.    Advanced directives: Advance directive discussed with you today. Even though you declined this today please call our office should you change your mind and we can give you the proper paperwork for you to fill out.  Conditions/risks identified: maintain health   Next appointment: Follow up in one year for your annual wellness visit    Preventive Care 65 Years and Older, Female Preventive care refers to lifestyle choices and visits with your health care provider that can promote health and wellness. What does preventive care include? A yearly physical exam. This is also called an annual well check. Dental exams once or twice a year. Routine eye exams. Ask your health care provider how often you should have your eyes checked. Personal lifestyle  choices, including: Daily care of your teeth and gums. Regular physical activity. Eating a healthy diet. Avoiding tobacco and drug use. Limiting alcohol use. Practicing safe sex. Taking low-dose aspirin every day. Taking vitamin and mineral supplements as recommended by your health care provider. What happens during an annual well check? The services and screenings done by your health care provider during your annual well check will depend on your age, overall health, lifestyle risk factors, and family history of disease. Counseling  Your health care provider may ask you questions about your: Alcohol use. Tobacco use. Drug use. Emotional well-being. Home and relationship well-being. Sexual activity. Eating habits. History of falls. Memory and ability to understand (cognition). Work and work Astronomer. Reproductive health. Screening  You may have the following tests or measurements: Height, weight, and BMI. Blood pressure. Lipid and cholesterol levels. These may be checked every 5 years, or more frequently if you are over 22 years old. Skin check. Lung cancer screening. You may have this screening every year starting at age 24 if you have a 30-pack-year history of smoking and currently smoke or have quit within the past 15 years. Fecal occult blood test (FOBT) of the stool. You may have this test every year starting at age 22. Flexible sigmoidoscopy or colonoscopy. You may have a sigmoidoscopy every 5 years or a colonoscopy every 10 years  starting at age 29. Hepatitis C blood test. Hepatitis B blood test. Sexually transmitted disease (STD) testing. Diabetes screening. This is done by checking your blood sugar (glucose) after you have not eaten for a while (fasting). You may have this done every 1-3 years. Bone density scan. This is done to screen for osteoporosis. You may have this done starting at age 53. Mammogram. This may be done every 1-2 years. Talk to your health care  provider about how often you should have regular mammograms. Talk with your health care provider about your test results, treatment options, and if necessary, the need for more tests. Vaccines  Your health care provider may recommend certain vaccines, such as: Influenza vaccine. This is recommended every year. Tetanus, diphtheria, and acellular pertussis (Tdap, Td) vaccine. You may need a Td booster every 10 years. Zoster vaccine. You may need this after age 65. Pneumococcal 13-valent conjugate (PCV13) vaccine. One dose is recommended after age 22. Pneumococcal polysaccharide (PPSV23) vaccine. One dose is recommended after age 90. Talk to your health care provider about which screenings and vaccines you need and how often you need them. This information is not intended to replace advice given to you by your health care provider. Make sure you discuss any questions you have with your health care provider. Document Released: 03/28/2015 Document Revised: 11/19/2015 Document Reviewed: 12/31/2014 Elsevier Interactive Patient Education  2017 [place] Prevention in the Home Falls can cause injuries. They can happen to people of all ages. There are many things you can do to make your home safe and to help prevent falls. What can I do on the outside of my home? Regularly fix the edges of walkways and driveways and fix any cracks. Remove anything that might make you trip as you walk through a door, such as a raised step or threshold. Trim any bushes or trees on the path to your home. Use bright outdoor lighting. Clear any walking paths of anything that might make someone trip, such as rocks or tools. Regularly check to see if handrails are loose or broken. Make sure that both sides of any steps have handrails. Any raised decks and porches should have guardrails on the edges. Have any leaves, snow, or ice cleared regularly. Use sand or salt on walking paths during winter. Clean up any  spills in your garage right away. This includes oil or grease spills. What can I do in the bathroom? Use night lights. Install grab bars by the toilet and in the tub and shower. Do not use towel bars as grab bars. Use non-skid mats or decals in the tub or shower. If you need to sit down in the shower, use a plastic, non-slip stool. Keep the floor dry. Clean up any water that spills on the floor as soon as it happens. Remove soap buildup in the tub or shower regularly. Attach bath mats securely with double-sided non-slip rug tape. Do not have throw rugs and other things on the floor that can make you trip. What can I do in the bedroom? Use night lights. Make sure that you have a light by your bed that is easy to reach. Do not use any sheets or blankets that are too big for your bed. They should not hang down onto the floor. Have a firm chair that has side arms. You can use this for support while you get dressed. Do not have throw rugs and other things on the floor that can make you trip. What  can I do in the kitchen? Clean up any spills right away. Avoid walking on wet floors. Keep items that you use a lot in easy-to-reach places. If you need to reach something above you, use a strong step stool that has a grab bar. Keep electrical cords out of the way. Do not use floor polish or wax that makes floors slippery. If you must use wax, use non-skid floor wax. Do not have throw rugs and other things on the floor that can make you trip. What can I do with my stairs? Do not leave any items on the stairs. Make sure that there are handrails on both sides of the stairs and use them. Fix handrails that are broken or loose. Make sure that handrails are as long as the stairways. Check any carpeting to make sure that it is firmly attached to the stairs. Fix any carpet that is loose or worn. Avoid having throw rugs at the top or bottom of the stairs. If you do have throw rugs, attach them to the floor  with carpet tape. Make sure that you have a light switch at the top of the stairs and the bottom of the stairs. If you do not have them, ask someone to add them for you. What else can I do to help prevent falls? Wear shoes that: Do not have high heels. Have rubber bottoms. Are comfortable and fit you well. Are closed at the toe. Do not wear sandals. If you use a stepladder: Make sure that it is fully opened. Do not climb a closed stepladder. Make sure that both sides of the stepladder are locked into place. Ask someone to hold it for you, if possible. Clearly mark and make sure that you can see: Any grab bars or handrails. First and last steps. Where the edge of each step is. Use tools that help you move around (mobility aids) if they are needed. These include: Canes. Walkers. Scooters. Crutches. Turn on the lights when you go into a dark area. Replace any light bulbs as soon as they burn out. Set up your furniture so you have a clear path. Avoid moving your furniture around. If any of your floors are uneven, fix them. If there are any pets around you, be aware of where they are. Review your medicines with your doctor. Some medicines can make you feel dizzy. This can increase your chance of falling. Ask your doctor what other things that you can do to help prevent falls. This information is not intended to replace advice given to you by your health care provider. Make sure you discuss any questions you have with your health care provider. Document Released: 12/26/2008 Document Revised: 08/07/2015 Document Reviewed: 04/05/2014 Elsevier Interactive Patient Education  2017 [employer].

## 2021-12-10 NOTE — Telephone Encounter (Signed)
PT TO GO TO ED   Patient Name: Brandi Cooper Gender: Female DOB: [birth year] Age: 76 Y 40 M 9 D Return Phone Number: [phone] (Primary), [phone] (Secondary) Address: City/ State/ Zip: [place]  [address] Client [facility] Provider Dimas Chyle- MD Contact Type Call Who Is Calling Patient / Member / Family / Caregiver Call Type Triage / Clinical Caller Name Danae Chen Relationship To Patient Daughter Return Phone Number [phone] (Primary) Chief Complaint CHEST PAIN - pain, pressure, heaviness or tightness Reason for Call Symptomatic / Request for Health Information Initial Comment Caller was transferred from the front office for a patient that is complaining of chest pain. (The daughter is calling on her mother's behalf). Translation No Nurse Assessment Nurse: Vallery Sa, RN, Cathy Date/Time (Eastern Time): 12/09/2021 2:25:37 PM Confirm and document reason for call. If symptomatic, describe symptoms. ---Caller states her mother developed sore throat, cold and chest pain with coughing only 2 days ago (current pain rated as severe). No severe breathing difficulty or blueness around her lips. She developed a cough about 3 days ago. No fever. She started antibiotics for Strep Throat 2 days ago. Alert and responsive. Does the patient have any new or worsening symptoms? ---Yes Will a triage be completed? ---Yes Related visit to physician within the last 2 weeks? ---Yes Does the PT have any chronic conditions? (i.e. diabetes, asthma, this includes High risk factors for pregnancy, etc.) ---Yes List chronic conditions. ---Diabetes, Glaucoma, Strep Throat Is this a behavioral health or substance abuse call? ---No Guidelines Guideline Title Affirmed Question Affirmed Notes Nurse Date/Time Eilene Ghazi Time) Chest Pain [1] Chest pain (or "angina") comes [place], RN, Tye [place]  12/09/2021 2:29:46 PM PLEASE NOTE: All timestamps contained within this report are represented as [place] Standard Time. CONFIDENTIALTY NOTICE: This fax transmission is intended only for the addressee. It contains information that is legally privileged, confidential or otherwise protected from use or disclosure. If you are not the intended recipient, you are strictly prohibited from reviewing, disclosing, copying using or disseminating any of this information or taking any action in reliance on or regarding this information. If you have received this fax in error, please notify us immediately by telephone so that we can arrange for its return to [place]. Phone: [phone], Toll-Free: [phone], Fax: [phone] Page: 2 of 2 Call Id: [id number] Guidelines Guideline Title Affirmed Question Affirmed Notes Nurse Date/Time Eilene Ghazi Time) and goes AND [2] is happening more often (increasing in frequency) or getting worse (increasing in severity) (Exception: Chest pains that last only a few seconds.) Disp. Time Eilene Ghazi Time) Disposition Final User 12/09/2021 2:24:36 PM Send to Urgent Clarnce Flock 12/09/2021 2:33:16 PM Go to ED Now Yes Vallery Sa, RN, Tye [place] Final Disposition 12/09/2021 2:33:16 PM Go to ED Now Yes Vallery Sa, RN, Rosey [place] Disagree/Comply Comply Caller Understands Yes PreDisposition Call Doctor Care Advice Given Per Guideline GO TO ED NOW: * You need to be seen in the Emergency Department. * Go to the ED at ___________ [place] now. Drive carefully. ANOTHER ADULT SHOULD DRIVE: * It is better and safer if another adult drives instead of you. BRING MEDICINES: * Bring a list of your current medicines when you go to the Emergency Department (ER). * Bring the pill bottles too. This will help the doctor (or NP/PA) to make certain you are taking the right medicines and the right dose. NOTHING BY MOUTH: * Do not eat or drink  anything for now. CALL EMS IF: * Severe  difficulty breathing occurs * Passes out or becomes too weak to stand * You become worse CARE ADVICE given per Chest Pain (Adult) guideline. Referrals [facility] - ED

## 2021-12-10 NOTE — Telephone Encounter (Signed)
Spoke with patient, stated taking antibiotic due to strep Patient stated Chest pain was due to coughing to hard  Cough stopped, no chest pain at this time  Refused ED visit

## 2021-12-10 NOTE — Progress Notes (Signed)
Subjective:   Brandi Cooper is a 76 y.o. female who presents for Medicare Annual (Subsequent) preventive examination.  Review of Systems     Cardiac Risk Factors include: advanced age (>34men, >28 women);diabetes mellitus;obesity (BMI >30kg/m2);hypertension     Objective:    Today's Vitals   12/10/21 1304  BP: 100/64  Pulse: 77  Temp: 97.6 F (36.4 C)  SpO2: 95%  Weight: 186 lb 6.4 oz (84.6 kg)   Body mass index is 31.02 kg/m.     12/10/2021    1:08 PM 03/25/2021   11:14 AM 11/27/2020    1:24 PM 10/12/2019    2:22 PM  Advanced Directives  Does Patient Have a Medical Advance Directive? No No No No  Would patient like information on creating a medical advance directive? No - Patient declined Yes (MAU/Ambulatory/Procedural Areas - Information given) Yes (MAU/Ambulatory/Procedural Areas - Information given) Yes (ED - Information included in AVS)    Current Medications (verified) Outpatient Encounter Medications as of 12/10/2021  Medication Sig   acetaZOLAMIDE (DIAMOX) 250 MG tablet    amoxicillin-clavulanate (AUGMENTIN) 875-125 MG tablet Take 1 tablet by mouth 2 (two) times daily.   Ascorbic Acid (VITAMIN C) 1000 MG tablet Take 1,000 mg by mouth daily.   aspirin 81 MG tablet Take 81 mg by mouth daily.   Biotin 5000 MCG CAPS Take by mouth.   brimonidine (ALPHAGAN) 0.2 % ophthalmic solution 3 (three) times daily.   Cholecalciferol (VITAMIN D3) 50 MCG (2000 UT) capsule Take by mouth.   dorzolamide-timolol (COSOPT) 22.3-6.8 MG/ML ophthalmic solution 1 drop 2 (two) times daily.   glipiZIDE (GLUCOTROL) 10 MG tablet TAKE 2 TABLETS(20 MG) BY MOUTH TWICE DAILY BEFORE A MEAL   GNP CINNAMON PO Take by mouth. Takes 2000mg  2 capfuls daily   lisinopril (ZESTRIL) 20 MG tablet TAKE 1 TABLET(20 MG) BY MOUTH DAILY   Omega-3 Fatty Acids (FISH OIL) 1200 MG CAPS Take 2 capsules by mouth.   pioglitazone (ACTOS) 45 MG tablet TAKE 1 TABLET(45 MG) BY MOUTH DAILY   ROCKLATAN 0.02-0.005 % SOLN  Apply 1 drop to eye at bedtime.   sodium bicarbonate 650 MG tablet Take 1,300 mg by mouth 2 (two) times daily.   timolol (BETIMOL) 0.5 % ophthalmic solution Place 1 drop into both eyes 2 (two) times daily.   tiZANidine (ZANAFLEX) 2 MG tablet Take 1-2 tablets (2-4 mg total) by mouth every 8 (eight) hours as needed for muscle spasms.   torsemide (DEMADEX) 20 MG tablet Take 1 tablet (20 mg total) by mouth 2 (two) times daily.   [DISCONTINUED] omeprazole (PRILOSEC) 40 MG capsule Take 40 mg by mouth daily.   No facility-administered encounter medications on file as of 12/10/2021.    Allergies (verified) Amitriptyline and Nsaids   History: Past Medical History:  Diagnosis Date   Chronic kidney disease    Diabetes mellitus without complication (HCC)    GERD (gastroesophageal reflux disease)    Glaucoma    Heart disease    Past Surgical History:  Procedure Laterality Date   ABDOMINAL HYSTERECTOMY     blood transfusion     BREAST SURGERY     Breast Reduction   CERVICAL FUSION     EYE SURGERY     KNEE CARTILAGE SURGERY     LUMBAR DISC SURGERY     REDUCTION MAMMAPLASTY     RETINOPATHY SURGERY     SHOULDER SURGERY Bilateral    Rotator Cuff   TRABECULECTOMY     TUBAL LIGATION  VITRECTOMY AND CATARACT     WRIST SURGERY     Carpal Tunnel   Family History  Problem Relation Age of Onset   Diabetes Mother    Hypertension Mother    Stroke Mother    Diabetes Father    Arthritis Sister    Cancer Sister    Heart attack Sister    Cancer Brother    Hypertension Daughter    Cancer Son    Stroke Maternal Grandmother    Hypertension Maternal Grandmother    Diabetes Paternal Grandmother    Heart attack Paternal Grandfather    Cancer Sister    Cancer Sister    Stroke Sister    Hypertension Sister    Diabetes Brother    Kidney disease Brother    Social History   Socioeconomic History   Marital status: Married    Spouse name: Not on file   Number of children: Not on file    Years of education: Not on file   Highest education level: Not on file  Occupational History   Occupation: retired  Tobacco Use   Smoking status: Never   Smokeless tobacco: Never  Vaping Use   Vaping Use: Never used  Substance and Sexual Activity   Alcohol use: Never   Drug use: Never   Sexual activity: Not on file  Other Topics Concern   Not on file  Social History Narrative   Not on file   Social Determinants of Health   Financial Resource Strain: Low Risk  (12/10/2021)   Overall Financial Resource Strain (CARDIA)    Difficulty of Paying Living Expenses: Not hard at all  Food Insecurity: No Food Insecurity (12/10/2021)   Hunger Vital Sign    Worried About Running Out of Food in the Last Year: Never true    Ran Out of Food in the Last Year: Never true  Transportation Needs: No Transportation Needs (12/10/2021)   PRAPARE - Administrator, Civil Service (Medical): No    Lack of Transportation (Non-Medical): No  Physical Activity: Inactive (12/10/2021)   Exercise Vital Sign    Days of Exercise per Week: 0 days    Minutes of Exercise per Session: 0 min  Stress: No Stress Concern Present (12/10/2021)   [employer] of Occupational Health - Occupational Stress Questionnaire    Feeling of Stress : Not at all  Social Connections: Moderately Integrated (12/10/2021)   Social Connection and Isolation Panel [NHANES]    Frequency of Communication with Friends and Family: More than three times a week    Frequency of Social Gatherings with Friends and Family: Twice a week    Attends Religious Services: 1 to 4 times per year    Active Member of [employer] or Organizations: No    Attends Engineer, structural: Never    Marital Status: Married    Tobacco Counseling Counseling given: Not Answered   Clinical Intake:  Pre-visit preparation completed: Yes  Pain : No/denies pain     BMI - recorded: 31.02 Nutritional Status: BMI > 30  Obese Nutritional Risks:  None Diabetes: Yes CBG done?: No Did pt. bring in CBG monitor from home?: No  How often do you need to have someone help you when you read instructions, pamphlets, or other written materials from your doctor or pharmacy?: 1 - Never  Diabetic?Nutrition Risk Assessment:  Has the patient had any N/V/D within the last 2 months?  No  Does the patient have any non-healing wounds?  No  Has the patient had any unintentional weight loss or weight gain?  No   Diabetes:  Is the patient diabetic?  Yes  If diabetic, was a CBG obtained today?  No  Did the patient bring in their glucometer from home?  No  How often do you monitor your CBG's? N/A.   Financial Strains and Diabetes Management:  Are you having any financial strains with the device, your supplies or your medication? No .  Does the patient want to be seen by Chronic Care Management for management of their diabetes?  No  Would the patient like to be referred to a Nutritionist or for Diabetic Management?  No   Diabetic Exams:  Diabetic Eye Exam: Completed 11/17/21 per pt  Diabetic Foot Exam: Overdue, Pt has been advised about the importance in completing this exam. Pt is scheduled for diabetic foot exam on next appt .   Interpreter Needed?: No  Information entered by :: Lanier Ensign, LPN   Activities of Daily Living    12/10/2021    1:12 PM  In your present state of health, do you have any difficulty performing the following activities:  Hearing? 0  Vision? 0  Difficulty concentrating or making decisions? 0  Walking or climbing stairs? 0  Dressing or bathing? 1  Comment at times related to knees  Doing errands, shopping? 0  Preparing Food and eating ? Y  Comment daughters assist  Using the Toilet? N  In the past six months, have you accidently leaked urine? Y  Comment wears a pad  Do you have problems with loss of bowel control? N  Managing your Medications? N  Managing your Finances? N  Housekeeping or managing your  Housekeeping? N    Patient Care Team: Ardith Dark, MD as PCP - General (Family Medicine)  Indicate any recent Medical Services you may have received from other than [facility] providers in the past year (date may be approximate).     Assessment:   This is a routine wellness examination for Brandi Cooper.  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  Vision Screening - Comments:: Pt follows up with Dr Dione Booze for annul eye exams   Dietary issues and exercise activities discussed: Current Exercise Habits: The patient does not participate in regular exercise at present   Goals Addressed             This Visit's Progress    Patient Stated       Maintain health        Depression Screen    12/10/2021    1:10 PM 01/29/2021    1:22 PM 11/27/2020    1:17 PM 10/12/2019    2:18 PM 09/26/2018   11:05 AM  PHQ 2/9 Scores  PHQ - 2 Score 0 0 0 0 0    Fall Risk    12/10/2021    1:11 PM 01/29/2021    1:22 PM 11/27/2020    1:26 PM 10/12/2019    2:24 PM  Fall Risk   Falls in the past year? 0 1 1 1   Number falls in past yr: 0 1 1 1   Injury with Fall? 0 0 1 1  Comment   brusing and hurt back   Risk for fall due to : No Fall Risks;Impaired vision;Impaired balance/gait;Impaired mobility  Impaired vision;Impaired balance/gait Impaired mobility;Impaired vision;Impaired balance/gait  Risk for fall due to: Comment at times   has a hard time with depth perciption  Follow up Falls prevention discussed  Falls prevention discussed     FALL RISK PREVENTION PERTAINING TO THE HOME:  Any stairs in or around the home? No  If so, are there any without handrails? No  Home free of loose throw rugs in walkways, pet beds, electrical cords, etc? Yes  Adequate lighting in your home to reduce risk of falls? Yes   ASSISTIVE DEVICES UTILIZED TO PREVENT FALLS:  Life alert? No  Use of a cane, walker or w/c? No  Grab bars in the bathroom? No  Shower chair or  bench in shower? No  Elevated toilet seat or a handicapped toilet? No   TIMED UP AND GO:  Was the test performed? Yes .  Length of time to ambulate 10 feet: 15 sec.   Gait steady and fast without use of assistive device  Cognitive Function:        12/10/2021    1:14 PM 11/27/2020    1:30 PM 10/12/2019    2:27 PM  6CIT Screen  What Year? 0 points 0 points 0 points  What month? 0 points 0 points 0 points  What time? 0 points 0 points 0 points  Count back from 20 0 points 0 points 0 points  Months in reverse 0 points 0 points 0 points  Repeat phrase 0 points 0 points 4 points  Total Score 0 points 0 points 4 points    Immunizations Immunization History  Administered Date(s) Administered   Moderna SARS-COV2 Booster Vaccination 09/17/2020   Moderna Sars-Covid-2 Vaccination 08/13/2019, 09/10/2019    TDAP status: Due, Education has been provided regarding the importance of this vaccine. Advised may receive this vaccine at local pharmacy or Health Dept. Aware to provide a copy of the vaccination record if obtained from local pharmacy or Health Dept. Verbalized acceptance and understanding.  Flu Vaccine status: Declined, Education has been provided regarding the importance of this vaccine but patient still declined. Advised may receive this vaccine at local pharmacy or Health Dept. Aware to provide a copy of the vaccination record if obtained from local pharmacy or Health Dept. Verbalized acceptance and understanding.  Pneumococcal vaccine status: Declined,  Education has been provided regarding the importance of this vaccine but patient still declined. Advised may receive this vaccine at local pharmacy or Health Dept. Aware to provide a copy of the vaccination record if obtained from local pharmacy or Health Dept. Verbalized acceptance and understanding.   Covid-19 vaccine status: Declined, Education has been provided regarding the importance of this vaccine but patient still declined.  Advised may receive this vaccine at local pharmacy or Health Dept.or vaccine clinic. Aware to provide a copy of the vaccination record if obtained from local pharmacy or Health Dept. Verbalized acceptance and understanding.  Qualifies for Shingles Vaccine? Yes   Zostavax completed No   Shingrix Completed?: No.    Education has been provided regarding the importance of this vaccine. Patient has been advised to call insurance company to determine out of pocket expense if they have not yet received this vaccine. Advised may also receive vaccine at local pharmacy or Health Dept. Verbalized acceptance and understanding.  Screening Tests Health Maintenance  Topic Date Due   Diabetic kidney evaluation - Urine ACR  Never done   Hepatitis C Screening  Never done   FOOT EXAM  09/26/2019   OPHTHALMOLOGY EXAM  07/23/2021   TETANUS/TDAP  09/25/2028 (Originally 08/30/1964)   HEMOGLOBIN A1C  01/31/2022   Diabetic kidney evaluation - GFR measurement  03/04/2022   COLONOSCOPY (Pts 45-357yrs Insurance coverage will need to be confirmed)  12/04/2025   DEXA SCAN  Completed   HPV VACCINES  Aged Out   Pneumonia Vaccine 2565+ Years old  Discontinued   INFLUENZA VACCINE  Discontinued   COVID-19 Vaccine  Discontinued   Zoster Vaccines- Shingrix  Discontinued    Health Maintenance  Health Maintenance Due  Topic Date Due   Diabetic kidney evaluation - Urine ACR  Never done   Hepatitis C Screening  Never done   FOOT EXAM  09/26/2019   OPHTHALMOLOGY EXAM  07/23/2021    Colorectal cancer screening: Type of screening: Colonoscopy. Completed 12/05/15. Repeat every 10 years  Mammogram postponed per pt    Bone Density status: Completed 02/01/20. Results reflect: Bone density results: NORMAL. Repeat every 2 years.  Additional Screening:  Hepatitis C Screening: does qualify;  Vision Screening: Recommended annual ophthalmology exams for early detection of glaucoma and other disorders of the eye. Is the patient  up to date with their annual eye exam?  Yes  Who is the provider or what is the name of the office in which the patient attends annual eye exams? Dr Dione BoozeGroat  If pt is not established with a provider, would they like to be referred to a provider to establish care? No .   Dental Screening: Recommended annual dental exams for proper oral hygiene  Community Resource Referral / Chronic Care Management: CRR required this visit?  No   CCM required this visit?  No      Plan:     I have personally reviewed and noted the following in the patient's chart:   Medical and social history Use of alcohol, tobacco or illicit drugs  Current medications and supplements including opioid prescriptions. Patient is not currently taking opioid prescriptions. Functional ability and status Nutritional status Physical activity Advanced directives List of other physicians Hospitalizations, surgeries, and ER visits in previous 12 months Vitals Screenings to include cognitive, depression, and falls Referrals and appointments  In addition, I have reviewed and discussed with patient certain preventive protocols, quality metrics, and best practice recommendations. A written personalized care plan for preventive services as well as general preventive health recommendations were provided to patient.     Marzella Schleinina H Amaziah Ghosh, LPN   4/09/81199/28/2023   Nurse Notes: none

## 2021-12-23 ENCOUNTER — Ambulatory Visit (HOSPITAL_COMMUNITY)
Admission: RE | Admit: 2021-12-23 | Discharge: 2021-12-23 | Disposition: A | Discharge status: Home / Self Care | Payer: Medicare Other | Source: Ambulatory Visit | Attending: Nephrology | Admitting: Nephrology

## 2021-12-23 VITALS — BP 131/56 | HR 73 | Temp 97.2°F | Resp 16

## 2021-12-23 DIAGNOSIS — N183 Chronic kidney disease, stage 3 unspecified: Secondary | ICD-10-CM | POA: Diagnosis present

## 2021-12-23 LAB — POCT HEMOGLOBIN-HEMACUE: Hemoglobin: 10.8 g/dL — ABNORMAL LOW (ref 12.0–15.0)

## 2021-12-23 LAB — FERRITIN: Ferritin: 390 ng/mL — ABNORMAL HIGH (ref 11–307)

## 2021-12-23 LAB — IRON AND TIBC
Iron: 65 ug/dL (ref 28–170)
Saturation Ratios: 22 % (ref 10.4–31.8)
TIBC: 297 ug/dL (ref 250–450)
UIBC: 232 ug/dL

## 2021-12-23 MED ORDER — EPOETIN ALFA-EPBX 10000 UNIT/ML IJ SOLN
15000.0000 [IU] | INTRAMUSCULAR | Status: DC
  Administered 2021-12-23: 15000 [IU] via SUBCUTANEOUS

## 2021-12-23 MED ORDER — EPOETIN ALFA-EPBX 10000 UNIT/ML IJ SOLN
INTRAMUSCULAR | Status: AC
  Filled 2021-12-23: qty 2

## 2021-12-24 ENCOUNTER — Encounter: Payer: Self-pay | Admitting: Family Medicine

## 2022-01-08 ENCOUNTER — Other Ambulatory Visit: Payer: Self-pay | Admitting: Family Medicine

## 2022-01-20 ENCOUNTER — Ambulatory Visit (HOSPITAL_COMMUNITY)
Admission: RE | Admit: 2022-01-20 | Discharge: 2022-01-20 | Disposition: A | Discharge status: Home / Self Care | Payer: Medicare Other | Source: Ambulatory Visit | Attending: Nephrology | Admitting: Nephrology

## 2022-01-20 VITALS — BP 107/59 | HR 72 | Temp 97.0°F | Resp 18

## 2022-01-20 DIAGNOSIS — N183 Chronic kidney disease, stage 3 unspecified: Secondary | ICD-10-CM | POA: Diagnosis present

## 2022-01-20 LAB — IRON AND TIBC
Iron: 86 ug/dL (ref 28–170)
Saturation Ratios: 26 % (ref 10.4–31.8)
TIBC: 333 ug/dL (ref 250–450)
UIBC: 247 ug/dL

## 2022-01-20 LAB — FERRITIN: Ferritin: 455 ng/mL — ABNORMAL HIGH (ref 11–307)

## 2022-01-20 LAB — POCT HEMOGLOBIN-HEMACUE: Hemoglobin: 11.7 g/dL — ABNORMAL LOW (ref 12.0–15.0)

## 2022-01-20 MED ORDER — EPOETIN ALFA-EPBX 10000 UNIT/ML IJ SOLN
INTRAMUSCULAR | Status: AC
  Administered 2022-01-20: 15000 [IU] via SUBCUTANEOUS
  Filled 2022-01-20: qty 2

## 2022-01-20 MED ORDER — EPOETIN ALFA-EPBX 10000 UNIT/ML IJ SOLN: 15000.0000 [IU] | INTRAMUSCULAR | Status: DC

## 2022-02-10 ENCOUNTER — Encounter: Payer: Medicare Other | Admitting: Family Medicine

## 2022-02-10 ENCOUNTER — Encounter: Payer: Self-pay | Admitting: Family Medicine

## 2022-02-10 ENCOUNTER — Ambulatory Visit (INDEPENDENT_AMBULATORY_CARE_PROVIDER_SITE_OTHER): Payer: Medicare Other | Admitting: Family Medicine

## 2022-02-10 VITALS — BP 110/68 | HR 72 | Temp 97.3°F | Ht 65.0 in | Wt 186.0 lb

## 2022-02-10 DIAGNOSIS — I152 Hypertension secondary to endocrine disorders: Secondary | ICD-10-CM

## 2022-02-10 DIAGNOSIS — N183 Chronic kidney disease, stage 3 unspecified: Secondary | ICD-10-CM

## 2022-02-10 DIAGNOSIS — J309 Allergic rhinitis, unspecified: Secondary | ICD-10-CM

## 2022-02-10 DIAGNOSIS — Z0001 Encounter for general adult medical examination with abnormal findings: Secondary | ICD-10-CM | POA: Diagnosis not present

## 2022-02-10 DIAGNOSIS — E1122 Type 2 diabetes mellitus with diabetic chronic kidney disease: Secondary | ICD-10-CM

## 2022-02-10 DIAGNOSIS — Z13220 Encounter for screening for lipoid disorders: Secondary | ICD-10-CM | POA: Diagnosis not present

## 2022-02-10 DIAGNOSIS — E1159 Type 2 diabetes mellitus with other circulatory complications: Secondary | ICD-10-CM | POA: Diagnosis not present

## 2022-02-10 LAB — LIPID PANEL
Cholesterol: 188 mg/dL (ref 0–200)
HDL: 55.6 mg/dL (ref >39.00)
LDL Cholesterol: 110 mg/dL — ABNORMAL HIGH (ref 0–99)
NonHDL: 132.12
Total CHOL/HDL Ratio: 3
Triglycerides: 113 mg/dL (ref 0.0–149.0)
VLDL: 22.6 mg/dL (ref 0.0–40.0)

## 2022-02-10 LAB — COMPREHENSIVE METABOLIC PANEL
ALT: 13 U/L (ref 0–35)
AST: 13 U/L (ref 0–37)
Albumin: 4 g/dL (ref 3.5–5.2)
Alkaline Phosphatase: 87 U/L (ref 39–117)
BUN: 46 mg/dL — ABNORMAL HIGH (ref 6–23)
CO2: 24 mEq/L (ref 19–32)
Calcium: 8.4 mg/dL (ref 8.4–10.5)
Chloride: 108 mEq/L (ref 96–112)
Creatinine, Ser: 2.29 mg/dL — ABNORMAL HIGH (ref 0.40–1.20)
GFR: 20.4 mL/min — ABNORMAL LOW (ref >60.00)
Glucose, Bld: 177 mg/dL — ABNORMAL HIGH (ref 70–99)
Potassium: 3.8 mEq/L (ref 3.5–5.1)
Sodium: 140 mEq/L (ref 135–145)
Total Bilirubin: 0.5 mg/dL (ref 0.2–1.2)
Total Protein: 6.9 g/dL (ref 6.0–8.3)

## 2022-02-10 LAB — CBC
HCT: 34.3 % — ABNORMAL LOW (ref 36.0–46.0)
Hemoglobin: 11.2 g/dL — ABNORMAL LOW (ref 12.0–15.0)
MCHC: 32.5 g/dL (ref 30.0–36.0)
MCV: 91.8 fl (ref 78.0–100.0)
Platelets: 205 10*3/uL (ref 150.0–400.0)
RBC: 3.74 Mil/uL — ABNORMAL LOW (ref 3.87–5.11)
RDW: 16.7 % — ABNORMAL HIGH (ref 11.5–15.5)
WBC: 5.7 10*3/uL (ref 4.0–10.5)

## 2022-02-10 LAB — HEMOGLOBIN A1C: Hgb A1c MFr Bld: 7.2 % — ABNORMAL HIGH (ref 4.6–6.5)

## 2022-02-10 LAB — TSH: TSH: 2.35 u[IU]/mL (ref 0.35–5.50)

## 2022-02-10 MED ORDER — LEVOFLOXACIN 500 MG PO TABS: 500.0000 mg | ORAL_TABLET | Freq: Every day | ORAL | 0 refills | Status: AC

## 2022-02-10 NOTE — Patient Instructions (Addendum)
It was very nice to see you today!  You probably have a sinus infection.  Please try the Levaquin.  Let me know if not improving and we can refer to see ear nose and throat doctor.  We will check blood work today.  We will see back in 6 months to recheck your A1c.  Please come back to see [place] sooner if needed.  Take care, Dr Jerline Pain  PLEASE NOTE:  If you had any lab tests please let us know if you have not heard back within a few days. You may see your results on mychart before we have a chance to review them but we will give you a call once they are reviewed by [place]. If we ordered any referrals today, please let us know if you have not heard from their office within the next week.   Please try these tips to maintain a healthy lifestyle:  Eat at least 3 REAL meals and 1-2 snacks per day.  Aim for no more than 5 hours between eating.  If you eat breakfast, please do so within one hour of getting up.   Each meal should contain half fruits/vegetables, one quarter protein, and one quarter carbs (no bigger than a computer mouse)  Cut down on sweet beverages. This includes juice, soda, and sweet tea.   Drink at least 1 glass of water with each meal and aim for at least 8 glasses per day  Exercise at least 150 minutes every week.    Preventive Care 60 Years and Older, Female Preventive care refers to lifestyle choices and visits with your health care provider that can promote health and wellness. Preventive care visits are also called wellness exams. What can I expect for my preventive care visit? Counseling Your health care provider may ask you questions about your: Medical history, including: Past medical problems. Family medical history. Pregnancy and menstrual history. History of falls. Current health, including: Memory and ability to understand (cognition). Emotional well-being. Home life and relationship well-being. Sexual activity and sexual health. Lifestyle,  including: Alcohol, nicotine or tobacco, and drug use. Access to firearms. Diet, exercise, and sleep habits. Work and work Statistician. Sunscreen use. Safety issues such as seatbelt and bike helmet use. Physical exam Your health care provider will check your: Height and weight. These may be used to calculate your BMI (body mass index). BMI is a measurement that tells if you are at a healthy weight. Waist circumference. This measures the distance around your waistline. This measurement also tells if you are at a healthy weight and may help predict your risk of certain diseases, such as type 2 diabetes and high blood pressure. Heart rate and blood pressure. Body temperature. Skin for abnormal spots. What immunizations do I need?  Vaccines are usually given at various ages, according to a schedule. Your health care provider will recommend vaccines for you based on your age, medical history, and lifestyle or other factors, such as travel or where you work. What tests do I need? Screening Your health care provider may recommend screening tests for certain conditions. This may include: Lipid and cholesterol levels. Hepatitis C test. Hepatitis B test. HIV (human immunodeficiency virus) test. STI (sexually transmitted infection) testing, if you are at risk. Lung cancer screening. Colorectal cancer screening. Diabetes screening. This is done by checking your blood sugar (glucose) after you have not eaten for a while (fasting). Mammogram. Talk with your health care provider about how often you should have regular mammograms. BRCA-related cancer  screening. This may be done if you have a family history of breast, ovarian, tubal, or peritoneal cancers. Bone density scan. This is done to screen for osteoporosis. Talk with your health care provider about your test results, treatment options, and if necessary, the need for more tests. Follow these instructions at home: Eating and drinking  Eat a  diet that includes fresh fruits and vegetables, whole grains, lean protein, and low-fat dairy products. Limit your intake of foods with high amounts of sugar, saturated fats, and salt. Take vitamin and mineral supplements as recommended by your health care provider. Do not drink alcohol if your health care provider tells you not to drink. If you drink alcohol: Limit how much you have to 0-1 drink a day. Know how much alcohol is in your drink. In the [place] one drink equals one 12 oz bottle of beer (355 mL), one 5 oz glass of wine (148 mL), or one 1 oz glass of hard liquor (44 mL). Lifestyle Brush your teeth every morning and night with fluoride toothpaste. Floss one time each day. Exercise for at least 30 minutes 5 or more days each week. Do not use any products that contain nicotine or tobacco. These products include cigarettes, chewing tobacco, and vaping devices, such as e-cigarettes. If you need help quitting, ask your health care provider. Do not use drugs. If you are sexually active, practice safe sex. Use a condom or other form of protection in order to prevent STIs. Take aspirin only as told by your health care provider. Make sure that you understand how much to take and what form to take. Work with your health care provider to find out whether it is safe and beneficial for you to take aspirin daily. Ask your health care provider if you need to take a cholesterol-lowering medicine (statin). Find healthy ways to manage stress, such as: Meditation, yoga, or listening to music. Journaling. Talking to a trusted person. Spending time with friends and family. Minimize exposure to UV radiation to reduce your risk of skin cancer. Safety Always wear your seat belt while driving or riding in a vehicle. Do not drive: If you have been drinking alcohol. Do not ride with someone who has been drinking. When you are tired or distracted. While texting. If you have been using any mind-altering  substances or drugs. Wear a helmet and other protective equipment during sports activities. If you have firearms in your house, make sure you follow all gun safety procedures. What's next? Visit your health care provider once a year for an annual wellness visit. Ask your health care provider how often you should have your eyes and teeth checked. Stay up to date on all vaccines. This information is not intended to replace advice given to you by your health care provider. Make sure you discuss any questions you have with your health care provider. Document Revised: 08/27/2020 Document Reviewed: 08/27/2020 Elsevier Patient Education  [place].

## 2022-02-10 NOTE — Assessment & Plan Note (Signed)
Continue management per nephrology.  Her GFR has been stable.  We will recheck labs today.  Avoid nephrotoxic medications.

## 2022-02-10 NOTE — Assessment & Plan Note (Signed)
Blood pressure at goal today on lisinopril 20 mg daily.

## 2022-02-10 NOTE — Progress Notes (Signed)
Chief Complaint:  Brandi Cooper is a 76 y.o. female who presents today for her annual comprehensive physical exam.    Assessment/Plan:  New/Acute Problems: Sinusitis  She only had modest improvement with Augmentin however symptoms returned.  Symptoms have been persistent for the last several weeks.  We will treat with course of Levaquin.  Okay to take 500 mg daily x5 days per her most recent GFR.  We will update renal function panel today and adjust dose of antibiotics if needed.  If her symptoms are a lot improving will need referral to ENT.  Chronic Problems Addressed Today: Type 2 diabetes mellitus with stage 3 chronic kidney disease, without long-term current use of insulin (HCC) Check A1c.  She is on glipizide 20 mg twice daily and Actos 45 mg daily.  CKD (chronic kidney disease) stage 3, GFR 30-59 ml/min (HCC) Continue management per nephrology.  Her GFR has been stable.  We will recheck labs today.  Avoid nephrotoxic medications.  Hypertension associated with diabetes ([place]) Blood pressure at goal today on lisinopril 20 mg daily.  Allergic rhinitis Likely contributing to above sinus infection.  Consider referral to ENT if this continues to be an issue.  Preventative Healthcare: Check labs today.  Declined vaccines.  Patient Counseling(The following topics were reviewed and/or handout was given):  -Nutrition: Stressed importance of moderation in sodium/caffeine intake, saturated fat and cholesterol, caloric balance, sufficient intake of fresh fruits, vegetables, and fiber.  -Stressed the importance of regular exercise.   -Substance Abuse: Discussed cessation/primary prevention of tobacco, alcohol, or other drug use; driving or other dangerous activities under the influence; availability of treatment for abuse.   -Injury prevention: Discussed safety belts, safety helmets, smoke detector, smoking near bedding or upholstery.   -Sexuality: Discussed sexually transmitted diseases,  partner selection, use of condoms, avoidance of unintended pregnancy and contraceptive alternatives.   -Dental health: Discussed importance of regular tooth brushing, flossing, and dental visits.  -Health maintenance and immunizations reviewed. Please refer to Health maintenance section.  Return to care in 1 year for next preventative visit.     Subjective:  HPI:  See A/p for status of chronic conditions.   She has had issue with cough for the last several months. Started after getting strep throat from her granddaughter. She was given an rx for augmentin which helped however symptoms have persisted. She is having a lot of runny nose and some sinus problems.      02/10/2022    9:07 AM  Depression screen PHQ 2/9  Decreased Interest 0  Down, Depressed, Hopeless 0  PHQ - 2 Score 0    Health Maintenance Due  Topic Date Due   Diabetic kidney evaluation - Urine ACR  Never done   Hepatitis C Screening  Never done   FOOT EXAM  09/26/2019   OPHTHALMOLOGY EXAM  07/23/2021   HEMOGLOBIN A1C  01/31/2022   Diabetic kidney evaluation - GFR measurement  03/04/2022     ROS: Per HPI, otherwise a complete review of systems was negative.   PMH:  The following were reviewed and entered/updated in epic: Past Medical History:  Diagnosis Date   Chronic kidney disease    Diabetes mellitus without complication (HCC)    GERD (gastroesophageal reflux disease)    Glaucoma    Heart disease    Patient Active Problem List   Diagnosis Date Noted   Chronic neck pain 07/31/2021   Osteoarthritis 01/29/2021   Vertigo 09/26/2018   Cluster headaches 09/26/2018   Dry  mouth 09/26/2018   Type 2 diabetes mellitus with stage 3 chronic kidney disease, without long-term current use of insulin (HCC) 03/30/2018   CKD (chronic kidney disease) stage 3, GFR 30-59 ml/min (HCC) 03/30/2018   Hypertension associated with diabetes (HCC) 03/30/2018   Glaucoma 03/30/2018   Chronic pain of both shoulders 03/30/2018    Allergic rhinitis 03/30/2018   Anemia 03/30/2018   Past Surgical History:  Procedure Laterality Date   ABDOMINAL HYSTERECTOMY     blood transfusion     BREAST SURGERY     Breast Reduction   CERVICAL FUSION     EYE SURGERY     KNEE CARTILAGE SURGERY     LUMBAR DISC SURGERY     REDUCTION MAMMAPLASTY     RETINOPATHY SURGERY     SHOULDER SURGERY Bilateral    Rotator Cuff   TRABECULECTOMY     TUBAL LIGATION     VITRECTOMY AND CATARACT     WRIST SURGERY     Carpal Tunnel    Family History  Problem Relation Age of Onset   Diabetes Mother    Hypertension Mother    Stroke Mother    Diabetes Father    Arthritis Sister    Cancer Sister    Heart attack Sister    Cancer Brother    Hypertension Daughter    Cancer Son    Stroke Maternal Grandmother    Hypertension Maternal Grandmother    Diabetes Paternal Grandmother    Heart attack Paternal Grandfather    Cancer Sister    Cancer Sister    Stroke Sister    Hypertension Sister    Diabetes Brother    Kidney disease Brother     Medications- reviewed and updated Current Outpatient Medications  Medication Sig Dispense Refill   acetaZOLAMIDE (DIAMOX) 250 MG tablet      Ascorbic Acid (VITAMIN C) 1000 MG tablet Take 1,000 mg by mouth daily.     aspirin 81 MG tablet Take 81 mg by mouth daily.     Biotin 5000 MCG CAPS Take by mouth.     brimonidine (ALPHAGAN) 0.2 % ophthalmic solution 3 (three) times daily.     Cholecalciferol (VITAMIN D3) 50 MCG (2000 UT) capsule Take by mouth.     dorzolamide-timolol (COSOPT) 22.3-6.8 MG/ML ophthalmic solution 1 drop 2 (two) times daily.     glipiZIDE (GLUCOTROL) 10 MG tablet TAKE 2 TABLETS(20 MG) BY MOUTH TWICE DAILY BEFORE A MEAL 360 tablet 0   GNP CINNAMON PO Take by mouth. Takes 2000mg  2 capfuls daily     levofloxacin (LEVAQUIN) 500 MG tablet Take 1 tablet (500 mg total) by mouth daily for 7 days. 5 tablet 0   lisinopril (ZESTRIL) 20 MG tablet TAKE 1 TABLET(20 MG) BY MOUTH DAILY 90 tablet 1    Omega-3 Fatty Acids (FISH OIL) 1200 MG CAPS Take 2 capsules by mouth.     pioglitazone (ACTOS) 45 MG tablet TAKE 1 TABLET(45 MG) BY MOUTH DAILY 90 tablet 1   ROCKLATAN 0.02-0.005 % SOLN Apply 1 drop to eye at bedtime.     sodium bicarbonate 650 MG tablet Take 1,300 mg by mouth 2 (two) times daily.     timolol (BETIMOL) 0.5 % ophthalmic solution Place 1 drop into both eyes 2 (two) times daily.     tiZANidine (ZANAFLEX) 2 MG tablet Take 1-2 tablets (2-4 mg total) by mouth every 8 (eight) hours as needed for muscle spasms. 60 tablet 1   torsemide (DEMADEX) 20 MG tablet Take  1 tablet (20 mg total) by mouth 2 (two) times daily. 180 tablet 3   No current facility-administered medications for this visit.    Allergies-reviewed and updated Allergies  Allergen Reactions   Amitriptyline    Nsaids     Was told to avoid all NSAIDs due to kidney function    Social History   Socioeconomic History   Marital status: Married    Spouse name: Not on file   Number of children: Not on file   Years of education: Not on file   Highest education level: Not on file  Occupational History   Occupation: retired  Tobacco Use   Smoking status: Never   Smokeless tobacco: Never  Vaping Use   Vaping Use: Never used  Substance and Sexual Activity   Alcohol use: Never   Drug use: Never   Sexual activity: Not on file  Other Topics Concern   Not on file  Social History Narrative   Not on file   Social Determinants of Health   Financial Resource Strain: Low Risk  (12/10/2021)   Overall Financial Resource Strain (CARDIA)    Difficulty of Paying Living Expenses: Not hard at all  Food Insecurity: No Food Insecurity (12/10/2021)   Hunger Vital Sign    Worried About Running Out of Food in the Last Year: Never true    [place] in the Last Year: Never true  Transportation Needs: No Transportation Needs (12/10/2021)   PRAPARE - Hydrologist (Medical): No    Lack of  Transportation (Non-Medical): No  Physical Activity: Inactive (12/10/2021)   Exercise Vital Sign    Days of Exercise per Week: 0 days    Minutes of Exercise per Session: 0 min  Stress: No Stress Concern Present (12/10/2021)   [place]    Feeling of Stress : Not at all  Social Connections: Moderately Integrated (12/10/2021)   Social Connection and Isolation Panel [NHANES]    Frequency of Communication with Friends and Family: More than three times a week    Frequency of Social Gatherings with Friends and Family: Twice a week    Attends Religious Services: 1 to 4 times per year    Active Member of [employer] or Organizations: No    Attends Music therapist: Never    Marital Status: Married        Objective:  Physical Exam: BP 110/68   Pulse 72   Temp (!) 97.3 F (36.3 C) (Temporal)   Ht 5\' 5"  (1.651 m)   Wt 186 lb (84.4 kg)   SpO2 99%   BMI 30.95 kg/m   Body mass index is 30.95 kg/m. Wt Readings from Last 3 Encounters:  02/10/22 186 lb (84.4 kg)  12/10/21 186 lb 6.4 oz (84.6 kg)  12/07/21 186 lb 12.8 oz (84.7 kg)  Gen: NAD, resting comfortably HEENT: TMs with clear effusion bilaterally. OP clear. No thyromegaly noted.  CV: RRR with no murmurs appreciated Pulm: NWOB, CTAB with no crackles, wheezes, or rhonchi GI: Normal bowel sounds present. Soft, Nontender, Nondistended. MSK: no edema, cyanosis, or clubbing noted Skin: warm, dry Neuro: CN2-12 grossly intact. Strength 5/5 in upper and lower extremities. Reflexes symmetric and intact bilaterally.  Psych: Normal affect and thought content     Liz Pinho M. Jerline Pain, MD 02/10/2022 9:49 AM

## 2022-02-10 NOTE — Assessment & Plan Note (Signed)
Check A1c.  She is on glipizide 20 mg twice daily and Actos 45 mg daily.

## 2022-02-10 NOTE — Assessment & Plan Note (Signed)
Likely contributing to above sinus infection.  Consider referral to ENT if this continues to be an issue.

## 2022-02-12 NOTE — Progress Notes (Signed)
Please inform patient of the following:  Kidney function slightly worse than last time but overall stable.  Blood counts are also a little low but stable compared to previous values.  Blood sugar and cholesterol are borderline but stable.   It is okay for her to continue her diabetes meds.  We should see her back in about 6 months to recheck A1c.  For her cholesterol she may benefit from starting a low-dose statin to improve her numbers and lower risk of heart attack and stroke.  Please send in atorvastatin 10 mg daily if she is agreeable to start.  Regardless, she should continue to work on diet and exercise and we can recheck in a year.

## 2022-02-17 ENCOUNTER — Ambulatory Visit (HOSPITAL_COMMUNITY)
Admission: RE | Admit: 2022-02-17 | Discharge: 2022-02-17 | Disposition: A | Discharge status: Home / Self Care | Payer: Medicare Other | Source: Ambulatory Visit | Attending: Nephrology | Admitting: Nephrology

## 2022-02-17 VITALS — BP 153/60 | HR 69 | Temp 97.5°F | Resp 17

## 2022-02-17 DIAGNOSIS — N183 Chronic kidney disease, stage 3 unspecified: Secondary | ICD-10-CM | POA: Diagnosis present

## 2022-02-17 LAB — IRON AND TIBC
Iron: 78 ug/dL (ref 28–170)
Saturation Ratios: 24 % (ref 10.4–31.8)
TIBC: 323 ug/dL (ref 250–450)
UIBC: 245 ug/dL

## 2022-02-17 LAB — FERRITIN: Ferritin: 341 ng/mL — ABNORMAL HIGH (ref 11–307)

## 2022-02-17 LAB — POCT HEMOGLOBIN-HEMACUE: Hemoglobin: 10.5 g/dL — ABNORMAL LOW (ref 12.0–15.0)

## 2022-02-17 MED ORDER — EPOETIN ALFA-EPBX 10000 UNIT/ML IJ SOLN
15000.0000 [IU] | INTRAMUSCULAR | Status: DC
  Administered 2022-02-17: 15000 [IU] via SUBCUTANEOUS

## 2022-02-17 MED ORDER — EPOETIN ALFA-EPBX 10000 UNIT/ML IJ SOLN
INTRAMUSCULAR | Status: AC
  Filled 2022-02-17: qty 2

## 2022-02-18 ENCOUNTER — Telehealth: Payer: Self-pay | Admitting: Family Medicine

## 2022-02-18 NOTE — Telephone Encounter (Signed)
Reason for Referral Request:   Persistent Left ear and throat pain    Yes, Please find out following information:   Reason: Recent CPE PCP prescribed antibiotic for pain in throat. Complete course of antibiotic on Sunday 02/14/22 and pain is not improved.  Referral to which Specialty: ENT  Preferred office/ provider:   None known

## 2022-02-19 NOTE — Telephone Encounter (Signed)
Ok to placed referral  

## 2022-02-24 ENCOUNTER — Other Ambulatory Visit: Payer: Self-pay | Admitting: *Deleted

## 2022-02-24 MED ORDER — ATORVASTATIN CALCIUM 10 MG PO TABS: 10.0000 mg | ORAL_TABLET | Freq: Every day | ORAL | 3 refills | Status: DC

## 2022-02-25 ENCOUNTER — Telehealth: Payer: Self-pay | Admitting: Family Medicine

## 2022-02-25 NOTE — Telephone Encounter (Signed)
Caller states she is sending a form to be completed confirming if patient is still taking a statin or not. States she wanted to make sure we looked out for this.

## 2022-03-01 ENCOUNTER — Emergency Department (HOSPITAL_COMMUNITY)
Admission: EM | Admit: 2022-03-01 | Discharge: 2022-03-01 | Disposition: A | Discharge status: Home / Self Care | Payer: Medicare Other | Attending: Emergency Medicine | Admitting: Emergency Medicine

## 2022-03-01 ENCOUNTER — Other Ambulatory Visit: Payer: Self-pay

## 2022-03-01 ENCOUNTER — Emergency Department (HOSPITAL_COMMUNITY): Payer: Medicare Other

## 2022-03-01 ENCOUNTER — Encounter (HOSPITAL_COMMUNITY): Payer: Self-pay

## 2022-03-01 DIAGNOSIS — Z79899 Other long term (current) drug therapy: Secondary | ICD-10-CM | POA: Diagnosis not present

## 2022-03-01 DIAGNOSIS — E162 Hypoglycemia, unspecified: Secondary | ICD-10-CM

## 2022-03-01 DIAGNOSIS — Z7982 Long term (current) use of aspirin: Secondary | ICD-10-CM | POA: Insufficient documentation

## 2022-03-01 DIAGNOSIS — N183 Chronic kidney disease, stage 3 unspecified: Secondary | ICD-10-CM | POA: Diagnosis not present

## 2022-03-01 DIAGNOSIS — Z7984 Long term (current) use of oral hypoglycemic drugs: Secondary | ICD-10-CM | POA: Insufficient documentation

## 2022-03-01 DIAGNOSIS — R197 Diarrhea, unspecified: Secondary | ICD-10-CM | POA: Insufficient documentation

## 2022-03-01 DIAGNOSIS — E876 Hypokalemia: Secondary | ICD-10-CM | POA: Insufficient documentation

## 2022-03-01 DIAGNOSIS — R112 Nausea with vomiting, unspecified: Secondary | ICD-10-CM | POA: Diagnosis not present

## 2022-03-01 DIAGNOSIS — Z1152 Encounter for screening for COVID-19: Secondary | ICD-10-CM | POA: Insufficient documentation

## 2022-03-01 DIAGNOSIS — I129 Hypertensive chronic kidney disease with stage 1 through stage 4 chronic kidney disease, or unspecified chronic kidney disease: Secondary | ICD-10-CM | POA: Insufficient documentation

## 2022-03-01 DIAGNOSIS — E1122 Type 2 diabetes mellitus with diabetic chronic kidney disease: Secondary | ICD-10-CM | POA: Diagnosis not present

## 2022-03-01 DIAGNOSIS — E11649 Type 2 diabetes mellitus with hypoglycemia without coma: Secondary | ICD-10-CM | POA: Diagnosis not present

## 2022-03-01 LAB — CBC WITH DIFFERENTIAL/PLATELET
Abs Immature Granulocytes: 0.02 10*3/uL (ref 0.00–0.07)
Basophils Absolute: 0 10*3/uL (ref 0.0–0.1)
Basophils Relative: 0 %
Eosinophils Absolute: 0 10*3/uL (ref 0.0–0.5)
Eosinophils Relative: 0 %
HCT: 36.8 % (ref 36.0–46.0)
Hemoglobin: 11.4 g/dL — ABNORMAL LOW (ref 12.0–15.0)
Immature Granulocytes: 0 %
Lymphocytes Relative: 12 %
Lymphs Abs: 0.6 10*3/uL — ABNORMAL LOW (ref 0.7–4.0)
MCH: 28.9 pg (ref 26.0–34.0)
MCHC: 31 g/dL (ref 30.0–36.0)
MCV: 93.4 fL (ref 80.0–100.0)
Monocytes Absolute: 0.2 10*3/uL (ref 0.1–1.0)
Monocytes Relative: 4 %
Neutro Abs: 4.5 10*3/uL (ref 1.7–7.7)
Neutrophils Relative %: 84 %
Platelets: 166 10*3/uL (ref 150–400)
RBC: 3.94 MIL/uL (ref 3.87–5.11)
RDW: 15.8 % — ABNORMAL HIGH (ref 11.5–15.5)
WBC: 5.4 10*3/uL (ref 4.0–10.5)
nRBC: 0 % (ref 0.0–0.2)

## 2022-03-01 LAB — COMPREHENSIVE METABOLIC PANEL
ALT: 17 U/L (ref 0–44)
AST: 25 U/L (ref 15–41)
Albumin: 3.4 g/dL — ABNORMAL LOW (ref 3.5–5.0)
Alkaline Phosphatase: 60 U/L (ref 38–126)
Anion gap: 11 (ref 5–15)
BUN: 24 mg/dL — ABNORMAL HIGH (ref 8–23)
CO2: 21 mmol/L — ABNORMAL LOW (ref 22–32)
Calcium: 8.5 mg/dL — ABNORMAL LOW (ref 8.9–10.3)
Chloride: 108 mmol/L (ref 98–111)
Creatinine, Ser: 1.66 mg/dL — ABNORMAL HIGH (ref 0.44–1.00)
GFR, Estimated: 32 mL/min — ABNORMAL LOW (ref >60)
Glucose, Bld: 151 mg/dL — ABNORMAL HIGH (ref 70–99)
Potassium: 2.8 mmol/L — ABNORMAL LOW (ref 3.5–5.1)
Sodium: 140 mmol/L (ref 135–145)
Total Bilirubin: 0.8 mg/dL (ref 0.3–1.2)
Total Protein: 7 g/dL (ref 6.5–8.1)

## 2022-03-01 LAB — URINALYSIS, ROUTINE W REFLEX MICROSCOPIC
Bilirubin Urine: NEGATIVE
Glucose, UA: NEGATIVE mg/dL
Hgb urine dipstick: NEGATIVE
Ketones, ur: NEGATIVE mg/dL
Nitrite: NEGATIVE
Protein, ur: NEGATIVE mg/dL
Specific Gravity, Urine: 1.011 (ref 1.005–1.030)
pH: 5 (ref 5.0–8.0)

## 2022-03-01 LAB — RESP PANEL BY RT-PCR (RSV, FLU A&B, COVID)  RVPGX2
Influenza A by PCR: NEGATIVE
Influenza B by PCR: NEGATIVE
Resp Syncytial Virus by PCR: NEGATIVE
SARS Coronavirus 2 by RT PCR: NEGATIVE

## 2022-03-01 LAB — CBG MONITORING, ED: Glucose-Capillary: 75 mg/dL (ref 70–99)

## 2022-03-01 MED ORDER — ONDANSETRON HCL 4 MG/2ML IJ SOLN
4.0000 mg | Freq: Once | INTRAMUSCULAR | Status: DC
  Filled 2022-03-01: qty 2

## 2022-03-01 MED ORDER — POTASSIUM CHLORIDE 10 MEQ/100ML IV SOLN
10.0000 meq | Freq: Once | INTRAVENOUS | Status: AC
  Administered 2022-03-01: 10 meq via INTRAVENOUS
  Filled 2022-03-01: qty 100

## 2022-03-01 MED ORDER — POTASSIUM CHLORIDE CRYS ER 20 MEQ PO TBCR
40.0000 meq | EXTENDED_RELEASE_TABLET | Freq: Once | ORAL | Status: AC
  Administered 2022-03-01: 40 meq via ORAL
  Filled 2022-03-01: qty 2

## 2022-03-01 NOTE — ED Triage Notes (Signed)
Pt BIB [facility] from home c/o slurred speech and unsteady gait and LKW was 130am. Pt's CBG was 69 so they gave her 15 grams of oral glucose and then 25g of D10. Pt's symptoms resolved once getting her CBG back to normal range.

## 2022-03-01 NOTE — ED Provider Notes (Signed)
[facility] EMERGENCY DEPARTMENT Provider Note   CSN: [id number] Arrival date & time: 03/01/22  6270     History  Chief Complaint  Patient presents with   Hypoglycemia    Brandi Cooper is a 76 y.o. female. Patient presents to the emergency department via EMS with a complaint of slurred speech and unsteady gait. She was last seen well at 1:30AM. EMS noted that the patient had a blood glucose of 69 and administered 15 g of oral glucose and 25 g of D10.  Patient's symptoms resolved after improvements in her blood glucose level.  Patient states that for the past 3 to 4 days she has had diarrhea with some episodes of emesis.  She states in the past when this has happened to her kidney function has been altered.  She states that she has stage IV chronic kidney disease and has documented stage III chronic kidney disease.  She denies fevers, abdominal pain, chest pain, shortness of breath, headache.  She takes Actos and glipizide for type 2 diabetes.  She states she is continue to take this morning.  She has a decreased appetite due to the illness.  She states that yesterday she had a bagel and a bowl of wonton soup but normally eats much more than this.  She currently denies nausea and vomiting but endorses continued diarrhea.  She is alert and oriented at this time.  Past medical history significant for type II DM, stage III chronic kidney disease, heart disease, GERD, hypertension  HPI     Home Medications Prior to Admission medications   Medication Sig Start Date End Date Taking? Authorizing Provider  atorvastatin (LIPITOR) 10 MG tablet Take 1 tablet (10 mg total) by mouth daily. 02/24/22   Ardith Dark, MD  acetaZOLAMIDE (DIAMOX) 250 MG tablet  03/27/18   [provider]  Ascorbic Acid (VITAMIN C) 1000 MG tablet Take 1,000 mg by mouth daily.    [provider]  aspirin 81 MG tablet Take 81 mg by mouth daily.    [provider]  Biotin 5000  MCG CAPS Take by mouth.    [provider]  brimonidine (ALPHAGAN) 0.2 % ophthalmic solution 3 (three) times daily.    [provider]  Cholecalciferol (VITAMIN D3) 50 MCG (2000 UT) capsule Take by mouth.    [provider]  dorzolamide-timolol (COSOPT) 22.3-6.8 MG/ML ophthalmic solution 1 drop 2 (two) times daily. 10/19/21   [provider]  glipiZIDE (GLUCOTROL) 10 MG tablet TAKE 2 TABLETS(20 MG) BY MOUTH TWICE DAILY BEFORE A MEAL 01/08/22   Ardith Dark, MD  GNP CINNAMON PO Take by mouth. Takes 2000mg  2 capfuls daily    [provider]  lisinopril (ZESTRIL) 20 MG tablet TAKE 1 TABLET(20 MG) BY MOUTH DAILY 08/17/21   10/17/21, MD  Omega-3 Fatty Acids (FISH OIL) 1200 MG CAPS Take 2 capsules by mouth.    [provider]  pioglitazone (ACTOS) 45 MG tablet TAKE 1 TABLET(45 MG) BY MOUTH DAILY 08/17/21   10/17/21, MD  ROCKLATAN 0.02-0.005 % SOLN Apply 1 drop to eye at bedtime. 07/24/20   [provider]  sodium bicarbonate 650 MG tablet Take 1,300 mg by mouth 2 (two) times daily. 09/17/19   [provider]  timolol (BETIMOL) 0.5 % ophthalmic solution Place 1 drop into both eyes 2 (two) times daily.    [provider]  tiZANidine (ZANAFLEX) 2 MG tablet Take 1-2 tablets (2-4 mg total)  by mouth every 8 (eight) hours as needed for muscle spasms. 08/05/21   Rodolph Bongorey, Evan S, MD  torsemide (DEMADEX) 20 MG tablet Take 1 tablet (20 mg total) by mouth 2 (two) times daily. 07/31/21   Ardith DarkParker, Caleb M, MD      Allergies    Amitriptyline and Nsaids    Review of Systems   Review of Systems  Constitutional:  Negative for fever.  Respiratory:  Negative for shortness of breath.   Cardiovascular:  Negative for chest pain.  Gastrointestinal:  Positive for diarrhea, nausea and vomiting. Negative for abdominal pain.  Neurological:  Negative for weakness and headaches.       Slurred speech resolved prior to arrival at the emergency  department    Physical Exam Updated Vital Signs BP 110/76 (BP Location: Left Arm)   Pulse 86   Temp 98.5 F (36.9 C) (Oral)   Resp 18   Ht 5\' 5"  (1.651 m)   Wt 82.1 kg   SpO2 100%   BMI 30.12 kg/m  Physical Exam Vitals and nursing note reviewed.  Constitutional:      General: She is not in acute distress.    Appearance: She is well-developed.  HENT:     Head: Normocephalic and atraumatic.  Eyes:     Extraocular Movements: Extraocular movements intact.     Conjunctiva/sclera: Conjunctivae normal.     Pupils: Pupils are equal, round, and reactive to light.  Cardiovascular:     Rate and Rhythm: Normal rate and regular rhythm.     Heart sounds: No murmur heard. Pulmonary:     Effort: Pulmonary effort is normal. No respiratory distress.     Breath sounds: Normal breath sounds.  Abdominal:     Palpations: Abdomen is soft.     Tenderness: There is no abdominal tenderness.  Musculoskeletal:        General: No swelling.     Cervical back: Neck supple.  Skin:    General: Skin is warm and dry.     Capillary Refill: Capillary refill takes less than 2 seconds.  Neurological:     General: No focal deficit present.     Mental Status: She is alert.     Comments: Cranial-2 through 7, 11, 12 intact.  Strength grossly equal bilaterally.  No facial droop, no slurred speech noted.  Psychiatric:        Mood and Affect: Mood normal.     ED Results / Procedures / Treatments   Labs (all labs ordered are listed, but only abnormal results are displayed) Labs Reviewed  COMPREHENSIVE METABOLIC PANEL - Abnormal; Notable for the following components:      Result Value   Potassium 2.8 (*)    CO2 21 (*)    Glucose, Bld 151 (*)    BUN 24 (*)    Creatinine, Ser 1.66 (*)    Calcium 8.5 (*)    Albumin 3.4 (*)    GFR, Estimated 32 (*)    All other components within normal limits  CBC WITH DIFFERENTIAL/PLATELET - Abnormal; Notable for the following components:   Hemoglobin 11.4 (*)    RDW  15.8 (*)    Lymphs Abs 0.6 (*)    All other components within normal limits  URINALYSIS, ROUTINE W REFLEX MICROSCOPIC - Abnormal; Notable for the following components:   Leukocytes,Ua TRACE (*)    Bacteria, UA RARE (*)    All other components within normal limits  RESP PANEL BY RT-PCR (RSV, FLU A&B, COVID)  RVPGX2  CBG MONITORING, ED    EKG EKG Interpretation  Date/Time:  Monday March 01 2022 08:40:47 EST Ventricular Rate:  66 PR Interval:  151 QRS Duration: 92 QT Interval:  407 QTC Calculation: 427 R Axis:   42 Text Interpretation: Sinus rhythm Consider left atrial enlargement Abnormal R-wave progression, early transition no prior ECG for comparison. No STEMI Confirmed by Theda Belfast ([id number]) on 03/01/2022 8:56:21 AM  Radiology DG Chest 1 View  Result Date: 03/01/2022 CLINICAL DATA:  Abdominal pain EXAM: CHEST  1 VIEW COMPARISON:  None Available. FINDINGS: The heart size and mediastinal contours are within normal limits. Both lungs are clear. The visualized skeletal structures are unremarkable. IMPRESSION: No active disease. Electronically Signed   By: Layla Maw M.D.   On: 03/01/2022 08:58    Procedures Procedures    Medications Ordered in ED Medications  ondansetron (ZOFRAN) injection 4 mg (4 mg Intravenous Not Given 03/01/22 0846)  potassium chloride 10 mEq in 100 mL IVPB (0 mEq Intravenous Stopped 03/01/22 1347)  potassium chloride SA (KLOR-CON M) CR tablet 40 mEq (40 mEq Oral Given 03/01/22 1223)    ED Course/ Medical Decision Making/ A&P                           Medical Decision Making Amount and/or Complexity of Data Reviewed Labs: ordered. Radiology: ordered.  Risk Prescription drug management.   This patient presents to the ED for concern of hypoglycemia, this involves an extensive number of treatment options, and is a complaint that carries with it a high risk of complications and morbidity.  The differential diagnosis includes as recent  medication, poor oral intake, leg illness, and others   Co morbidities that complicate the patient evaluation  History of stage III chronic kidney disease, type 2 diabetes   Additional history obtained:  Additional history obtained from EMS External records from outside source obtained and reviewed including Primary care notes from November 29 showing evaluation for chronic complaints including type 2 diabetes.  Patient prescribed glipizide twice daily.  Patient instructed to eat at least 3 "real "meals and 1-2 Snacks daily   Lab Tests:  I Ordered, and personally interpreted labs.  The pertinent results include: Initial CBG 75, glucose on CMP 151, potassium 2.8, creatinine 1.66 (baseline approximately 2), negative respiratory panel, urinalysis negative for infection   Imaging Studies ordered:  I ordered imaging studies including chest x-ray I independently visualized and interpreted imaging which showed no active disease I agree with the radiologist interpretation   Cardiac Monitoring: / EKG:  The patient was maintained on a cardiac monitor.  I personally viewed and interpreted the cardiac monitored which showed an underlying rhythm of: Sinus rhythm  Problem List / ED Course / Critical interventions / Medication management   I ordered medication including potassium for hypokalemia Reevaluation of the patient after these medicines showed that the patient improved I have reviewed the patients home medicines and have made adjustments as needed    Test / Admission - Considered:  Patient is feeling well at this time.  Patient was hypokalemic but workup was otherwise unremarkable.  I feel the patient's hypoglycemia is likely due to poor oral intake due to a viral gastroenteritis.  Patient is now able to tolerate oral intake and took the oral potassium with no difficulty.  Plan to discharge home with recommendations for follow-up with primary care as needed        Final  Clinical  Impression(s) / ED Diagnoses Final diagnoses:  Hypoglycemia  Hypokalemia  Nausea vomiting and diarrhea    Rx / DC Orders ED Discharge Orders     None         Pamala Duffel 03/01/22 1448    Tegeler, Canary Brim, MD 03/01/22 [phone]

## 2022-03-01 NOTE — Discharge Instructions (Signed)
You were evaluated today for low blood sugar.  This is likely due to your poor oral intake over the past few days due to a likely viral stomach illness.  Please be sure to eat meals as you are able.  If you have a repeat episode of low blood sugar I recommend follow-up with your primary care provider to see if any alteration your diabetic medications as needed.  If you develop any life-threatening symptoms please return to the emergency department for reevaluation.

## 2022-03-05 ENCOUNTER — Encounter (HOSPITAL_COMMUNITY): Payer: Self-pay | Admitting: *Deleted

## 2022-03-05 ENCOUNTER — Encounter: Payer: Self-pay | Admitting: Family Medicine

## 2022-03-05 ENCOUNTER — Ambulatory Visit (INDEPENDENT_AMBULATORY_CARE_PROVIDER_SITE_OTHER): Payer: Medicare Other | Admitting: Family Medicine

## 2022-03-05 VITALS — BP 137/78 | HR 63 | Temp 97.8°F | Ht 65.0 in | Wt 185.6 lb

## 2022-03-05 DIAGNOSIS — N183 Chronic kidney disease, stage 3 unspecified: Secondary | ICD-10-CM | POA: Diagnosis not present

## 2022-03-05 DIAGNOSIS — J309 Allergic rhinitis, unspecified: Secondary | ICD-10-CM

## 2022-03-05 DIAGNOSIS — E1122 Type 2 diabetes mellitus with diabetic chronic kidney disease: Secondary | ICD-10-CM | POA: Diagnosis not present

## 2022-03-05 NOTE — Progress Notes (Signed)
   Brandi Cooper is a 76 y.o. female who presents today for an office visit.  Assessment/Plan:  New/Acute Problems: Gastroenteritis  No red flags.  Seems to be improving the last few days.  Encouraged hydration.  She can continue using Imodium as needed  Hypokalemia Likely secondary to poor p.o. intake and diarrhea due to gastroenteritis as above.  Will recheck c-Met today.  Hypoglycemia Likely secondary to poor p.o. intake in setting of acute gastroenteritis however she is on glipizide as well which is likely contributing.  Will be managing as below.  Recheck c-Met today.  Chronic Problems Addressed Today: Type 2 diabetes mellitus with stage 3 chronic kidney disease, without long-term current use of insulin (HCC) Last A1c 7.2 however she is having some hypoglycemic events on her current regimen.  She is also occasionally having some sweating episodes which are likely hypoglycemic events as well.  We will stop her glipizide completely.  She will continue Actos 45 mg daily.  We discussed patient that she may have an increase in glucose with stopping the glipizide however it is more important that we avoid hypoglycemic events at this point.  Given her age, it is okay if we treat to an A1c goal of 8.  She will come back in a couple months and we can recheck A1c.  Allergic rhinitis Still has ongoing issues with recurrent sinusitis.  Will refer to ENT per patient request.     Subjective:  HPI:  Patient here for ED follow-up.  Went to ED 4 days ago via EMS with slurred speech and steady gait.  EMS found that patient had a blood glucose of 69 and she was given oral glucose and 25 g of D10.  Patient's symptoms improved rapidly afterwards.  She had labs in the ED which showed hypokalemia and glucose of 151.  She was given oral potassium. She was also noted to have had several days of GI illness prior presenting to the ED.  She tolerated p.o.  Symptoms improved and she was discharged home.  Over  the last couple of days she seems to be improving. She is still having a couple of episodes of diarrhea per day.She tried taking imodium without much improvement. No nausea. No vomiting.  No fevers or chills.         Objective:  Physical Exam: BP 137/78   Pulse 63   Temp 97.8 F (36.6 C) (Temporal)   Ht 5' 5" (1.651 m)   Wt 185 lb 9.6 oz (84.2 kg)   SpO2 99%   BMI 30.89 kg/m   Gen: No acute distress, resting comfortably CV: Regular rate and rhythm with no murmurs appreciated Pulm: Normal work of breathing, clear to auscultation bilaterally with no crackles, wheezes, or rhonchi Neuro: Grossly normal, moves all extremities Psych: Normal affect and thought content  Time Spent: 40 minutes of total time was spent on the date of the encounter performing the following actions: chart review prior to seeing the patient including recent ED visit, obtaining history, performing a medically necessary exam, counseling on the treatment plan, placing orders, and documenting in our EHR.        Algis Greenhouse. Jerline Pain, MD 03/05/2022 12:35 PM

## 2022-03-05 NOTE — Patient Instructions (Signed)
It was very nice to see you today!  Please stop your glipizide.  We will check blood work today.  We will refer you to ENT.  Please come back to see me in a couple months to recheck your A1c.  Take care, Dr [name]  PLEASE NOTE:  If you had any lab tests, please let us know if you have not heard back within a few days. You may see your results on mychart before we have a chance to review them but we will give you a call once they are reviewed by [place].   If we ordered any referrals today, please let us know if you have not heard from their office within the next week.   If you had any urgent prescriptions sent in today, please check with the pharmacy within an hour of our visit to make sure the prescription was transmitted appropriately.   Please try these tips to maintain a healthy lifestyle:  Eat at least 3 REAL meals and 1-2 snacks per day.  Aim for no more than 5 hours between eating.  If you eat breakfast, please do so within one hour of getting up.   Each meal should contain half fruits/vegetables, one quarter protein, and one quarter carbs (no bigger than a computer mouse)  Cut down on sweet beverages. This includes juice, soda, and sweet tea.   Drink at least 1 glass of water with each meal and aim for at least 8 glasses per day  Exercise at least 150 minutes every week.

## 2022-03-05 NOTE — Assessment & Plan Note (Addendum)
Last A1c 7.2 however she is having some hypoglycemic events on her current regimen.  She is also occasionally having some sweating episodes which are likely hypoglycemic events as well.  We will stop her glipizide completely.  She will continue Actos 45 mg daily.  We discussed patient that she may have an increase in glucose with stopping the glipizide however it is more important that we avoid hypoglycemic events at this point.  Given her age, it is okay if we treat to an A1c goal of 8.  She will come back in a couple months and we can recheck A1c.

## 2022-03-05 NOTE — Assessment & Plan Note (Signed)
Still has ongoing issues with recurrent sinusitis.  Will refer to ENT per patient request.

## 2022-03-06 LAB — COMPREHENSIVE METABOLIC PANEL
AG Ratio: 1.4 (calc) (ref 1.0–2.5)
ALT: 13 U/L (ref 6–29)
AST: 16 U/L (ref 10–35)
Albumin: 3.8 g/dL (ref 3.6–5.1)
Alkaline phosphatase (APISO): 69 U/L (ref 37–153)
BUN/Creatinine Ratio: 15 (calc) (ref 6–22)
BUN: 26 mg/dL — ABNORMAL HIGH (ref 7–25)
CO2: 21 mmol/L (ref 20–32)
Calcium: 8.7 mg/dL (ref 8.6–10.4)
Chloride: 107 mmol/L (ref 98–110)
Creat: 1.78 mg/dL — ABNORMAL HIGH (ref 0.60–1.00)
Globulin: 2.7 g/dL (calc) (ref 1.9–3.7)
Glucose, Bld: 332 mg/dL — ABNORMAL HIGH (ref 65–99)
Potassium: 4.1 mmol/L (ref 3.5–5.3)
Sodium: 141 mmol/L (ref 135–146)
Total Bilirubin: 0.6 mg/dL (ref 0.2–1.2)
Total Protein: 6.5 g/dL (ref 6.1–8.1)

## 2022-03-06 LAB — CBC
HCT: 33.2 % — ABNORMAL LOW (ref 35.0–45.0)
Hemoglobin: 10.6 g/dL — ABNORMAL LOW (ref 11.7–15.5)
MCH: 29.3 pg (ref 27.0–33.0)
MCHC: 31.9 g/dL — ABNORMAL LOW (ref 32.0–36.0)
MCV: 91.7 fL (ref 80.0–100.0)
MPV: 11.5 fL (ref 7.5–12.5)
Platelets: 208 10*3/uL (ref 140–400)
RBC: 3.62 10*6/uL — ABNORMAL LOW (ref 3.80–5.10)
RDW: 14.3 % (ref 11.0–15.0)
WBC: 5.5 10*3/uL (ref 3.8–10.8)

## 2022-03-09 NOTE — Progress Notes (Signed)
Please inform patient of the following:  Her kidney function is back to her baseline. Potassium is also back to normal. Her blood counts are stable.  Do not need to do any further testing at this point.  We can recheck at next office visit.

## 2022-03-17 ENCOUNTER — Encounter (HOSPITAL_COMMUNITY)
Admission: RE | Admit: 2022-03-17 | Discharge: 2022-03-17 | Disposition: A | Discharge status: Home / Self Care | Payer: Medicare Other | Source: Ambulatory Visit | Attending: Nephrology | Admitting: Nephrology

## 2022-03-17 VITALS — BP 110/54 | HR 72 | Temp 97.1°F | Resp 17

## 2022-03-17 DIAGNOSIS — N183 Chronic kidney disease, stage 3 unspecified: Secondary | ICD-10-CM | POA: Diagnosis present

## 2022-03-17 LAB — IRON AND TIBC
Iron: 65 ug/dL (ref 28–170)
Saturation Ratios: 23 % (ref 10.4–31.8)
TIBC: 279 ug/dL (ref 250–450)
UIBC: 214 ug/dL

## 2022-03-17 LAB — POCT HEMOGLOBIN-HEMACUE: Hemoglobin: 11.1 g/dL — ABNORMAL LOW (ref 12.0–15.0)

## 2022-03-17 LAB — FERRITIN: Ferritin: 567 ng/mL — ABNORMAL HIGH (ref 11–307)

## 2022-03-17 MED ORDER — EPOETIN ALFA-EPBX 10000 UNIT/ML IJ SOLN
15000.0000 [IU] | INTRAMUSCULAR | Status: DC
  Administered 2022-03-17: 15000 [IU] via SUBCUTANEOUS

## 2022-03-17 MED ORDER — EPOETIN ALFA-EPBX 10000 UNIT/ML IJ SOLN
INTRAMUSCULAR | Status: AC
  Filled 2022-03-17: qty 2

## 2022-04-14 ENCOUNTER — Encounter (HOSPITAL_COMMUNITY)
Admission: RE | Admit: 2022-04-14 | Discharge: 2022-04-14 | Disposition: A | Discharge status: Home / Self Care | Payer: Medicare Other | Source: Ambulatory Visit | Attending: Nephrology | Admitting: Nephrology

## 2022-04-14 VITALS — BP 120/45 | HR 70 | Temp 97.2°F | Resp 17

## 2022-04-14 DIAGNOSIS — N183 Chronic kidney disease, stage 3 unspecified: Secondary | ICD-10-CM | POA: Diagnosis not present

## 2022-04-14 LAB — POCT HEMOGLOBIN-HEMACUE: Hemoglobin: 11 g/dL — ABNORMAL LOW (ref 12.0–15.0)

## 2022-04-14 MED ORDER — EPOETIN ALFA-EPBX 10000 UNIT/ML IJ SOLN
15000.0000 [IU] | INTRAMUSCULAR | Status: DC
  Administered 2022-04-14: 15000 [IU] via SUBCUTANEOUS

## 2022-04-14 MED ORDER — EPOETIN ALFA-EPBX 10000 UNIT/ML IJ SOLN
INTRAMUSCULAR | Status: AC
  Filled 2022-04-14: qty 2

## 2022-04-17 ENCOUNTER — Other Ambulatory Visit: Payer: Self-pay | Admitting: Family Medicine

## 2022-05-12 ENCOUNTER — Ambulatory Visit (HOSPITAL_COMMUNITY)
Admission: RE | Admit: 2022-05-12 | Discharge: 2022-05-12 | Disposition: A | Discharge status: Home / Self Care | Payer: Medicare Other | Source: Ambulatory Visit | Attending: Nephrology | Admitting: Nephrology

## 2022-05-12 VITALS — BP 100/48 | HR 67 | Temp 97.0°F | Resp 17

## 2022-05-12 DIAGNOSIS — N183 Chronic kidney disease, stage 3 unspecified: Secondary | ICD-10-CM | POA: Diagnosis present

## 2022-05-12 LAB — IRON AND TIBC
Iron: 80 ug/dL (ref 28–170)
Saturation Ratios: 26 % (ref 10.4–31.8)
TIBC: 311 ug/dL (ref 250–450)
UIBC: 231 ug/dL

## 2022-05-12 LAB — POCT HEMOGLOBIN-HEMACUE: Hemoglobin: 10.8 g/dL — ABNORMAL LOW (ref 12.0–15.0)

## 2022-05-12 LAB — FERRITIN: Ferritin: 396 ng/mL — ABNORMAL HIGH (ref 11–307)

## 2022-05-12 MED ORDER — EPOETIN ALFA-EPBX 10000 UNIT/ML IJ SOLN
INTRAMUSCULAR | Status: AC
  Filled 2022-05-12: qty 2

## 2022-05-12 MED ORDER — EPOETIN ALFA-EPBX 10000 UNIT/ML IJ SOLN
15000.0000 [IU] | INTRAMUSCULAR | Status: DC
  Administered 2022-05-12: 15000 [IU] via SUBCUTANEOUS

## 2022-06-02 ENCOUNTER — Encounter: Payer: Self-pay | Admitting: Family Medicine

## 2022-06-02 ENCOUNTER — Ambulatory Visit (INDEPENDENT_AMBULATORY_CARE_PROVIDER_SITE_OTHER): Payer: Medicare Other | Admitting: Family Medicine

## 2022-06-02 VITALS — BP 104/59 | HR 75 | Temp 97.7°F | Ht 65.0 in | Wt 181.2 lb

## 2022-06-02 DIAGNOSIS — E1159 Type 2 diabetes mellitus with other circulatory complications: Secondary | ICD-10-CM

## 2022-06-02 DIAGNOSIS — E1122 Type 2 diabetes mellitus with diabetic chronic kidney disease: Secondary | ICD-10-CM

## 2022-06-02 DIAGNOSIS — M1990 Unspecified osteoarthritis, unspecified site: Secondary | ICD-10-CM

## 2022-06-02 DIAGNOSIS — M5136 Other intervertebral disc degeneration, lumbar region: Secondary | ICD-10-CM | POA: Diagnosis not present

## 2022-06-02 DIAGNOSIS — M25511 Pain in right shoulder: Secondary | ICD-10-CM | POA: Diagnosis not present

## 2022-06-02 DIAGNOSIS — M25512 Pain in left shoulder: Secondary | ICD-10-CM

## 2022-06-02 DIAGNOSIS — I152 Hypertension secondary to endocrine disorders: Secondary | ICD-10-CM

## 2022-06-02 DIAGNOSIS — N183 Chronic kidney disease, stage 3 unspecified: Secondary | ICD-10-CM | POA: Diagnosis not present

## 2022-06-02 DIAGNOSIS — G8929 Other chronic pain: Secondary | ICD-10-CM

## 2022-06-02 LAB — POCT GLYCOSYLATED HEMOGLOBIN (HGB A1C): Hemoglobin A1C: 10.1 % — AB (ref 4.0–5.6)

## 2022-06-02 MED ORDER — SITAGLIPTIN PHOSPHATE 25 MG PO TABS: 25.0000 mg | ORAL_TABLET | Freq: Every day | ORAL | 5 refills | Status: DC

## 2022-06-02 NOTE — Progress Notes (Signed)
   Brandi Cooper is a 77 y.o. female who presents today for an office visit.  Assessment/Plan:  Chronic Problems Addressed Today: Type 2 diabetes mellitus with stage 3 chronic kidney disease, without long-term current use of insulin (HCC) A1c elevated to 10.1. She has been compliant with actos 45mg  but has had several dietary indiscretions.  We need to avoid metformin due to her CKD.  We will add on renally dosed Januvia 25 mg daily.  Discussed potential side effects.  She will work on dietary modifications as well.  Will recheck A1c in 3 months.  Chronic pain of both shoulders Has seen orthopedics for this in the past.  Symptoms do seem to be worsening.  Advised her to follow back up with sports medicine soon.  Hypertension associated with diabetes ([place]) Blood pressure at goal today on lisinopril 20 mg daily.  Lumbar degenerative disc disease She has seen orthopedics and sports medicine for this in the past as well.  Symptoms are worsening in the last few months and she is interested in further treatment at this point.  Advised her to follow-up with sports medicine soon.  CKD (chronic kidney disease) stage 3, GFR 30-59 ml/min (HCC) Follows with nephrology for this.  Last GFR less than 30.  Will need to avoid nephrotoxic medications and renally dose meds as above.     Subjective:  HPI:  See A/p for status of chronic conditions. She is here today for follow up. She was last here about 3 months ago.  At her last visit she was having some hypoglycemic episodes and we decied to stop her glipizide. She was continued on actos 45mg  daily. She has not had any further hypoglycemic episodes. She does admit to several dietary indiscretions.   Her main concern today his worsening low back and bilateral shoulder pain. This has worsened over the last several weeks. She did have have disc herniation several years ago. She is having more pain radiating down her right leg. She has seen orthopedics in the  past for this but has not seen them for about a year.        Objective:  Physical Exam: BP (!) 104/59   Pulse 75   Temp 97.7 F (36.5 C) (Temporal)   Ht 5\' 5"  (1.651 m)   Wt 181 lb 3.2 oz (82.2 kg)   SpO2 99%   BMI 30.15 kg/m   Gen: No acute distress, resting comfortably CV: Regular rate and rhythm with no murmurs appreciated Pulm: Normal work of breathing, clear to auscultation bilaterally with no crackles, wheezes, or rhonchi Neuro: Grossly normal, moves all extremities Psych: Normal affect and thought content      Arlena Marsan M. Jerline Pain, MD 06/02/2022 10:07 AM

## 2022-06-02 NOTE — Assessment & Plan Note (Signed)
Has seen orthopedics for this in the past.  Symptoms do seem to be worsening.  Advised her to follow back up with sports medicine soon.

## 2022-06-02 NOTE — Patient Instructions (Signed)
It was very nice to see you today!  Your A1c is elevated at 10.1.  We need to work on reducing this.  Please start the [place].  Continue your Actos.  Please continue to work on diet and exercise.  We will have you follow back up with Dr. Bertram Millard for your back and shoulders.  I will see you back in 3 months.  Please come back to see me sooner if needed.  Take care, Dr Jerline Pain  PLEASE NOTE:  If you had any lab tests, please let us know if you have not heard back within a few days. You may see your results on mychart before we have a chance to review them but we will give you a call once they are reviewed by [place].   If we ordered any referrals today, please let us know if you have not heard from their office within the next week.   If you had any urgent prescriptions sent in today, please check with the pharmacy within an hour of our visit to make sure the prescription was transmitted appropriately.   Please try these tips to maintain a healthy lifestyle:  Eat at least 3 REAL meals and 1-2 snacks per day.  Aim for no more than 5 hours between eating.  If you eat breakfast, please do so within one hour of getting up.   Each meal should contain half fruits/vegetables, one quarter protein, and one quarter carbs (no bigger than a computer mouse)  Cut down on sweet beverages. This includes juice, soda, and sweet tea.   Drink at least 1 glass of water with each meal and aim for at least 8 glasses per day  Exercise at least 150 minutes every week.

## 2022-06-02 NOTE — Assessment & Plan Note (Signed)
Follows with nephrology for this.  Last GFR less than 30.  Will need to avoid nephrotoxic medications and renally dose meds as above.

## 2022-06-02 NOTE — Assessment & Plan Note (Signed)
She has seen orthopedics and sports medicine for this in the past as well.  Symptoms are worsening in the last few months and she is interested in further treatment at this point.  Advised her to follow-up with sports medicine soon.

## 2022-06-02 NOTE — Assessment & Plan Note (Signed)
Blood pressure at goal today on lisinopril 20 mg daily.

## 2022-06-02 NOTE — Assessment & Plan Note (Addendum)
A1c elevated to 10.1. She has been compliant with actos 45mg  but has had several dietary indiscretions.  We need to avoid metformin due to her CKD.  We will add on renally dosed Januvia 25 mg daily.  Discussed potential side effects.  She will work on dietary modifications as well.  Will recheck A1c in 3 months.

## 2022-06-09 ENCOUNTER — Ambulatory Visit (HOSPITAL_COMMUNITY)
Admission: RE | Admit: 2022-06-09 | Discharge: 2022-06-09 | Disposition: A | Discharge status: Home / Self Care | Payer: Medicare Other | Source: Ambulatory Visit | Attending: Nephrology | Admitting: Nephrology

## 2022-06-09 VITALS — BP 103/51 | HR 59 | Temp 98.2°F

## 2022-06-09 DIAGNOSIS — N183 Chronic kidney disease, stage 3 unspecified: Secondary | ICD-10-CM | POA: Diagnosis present

## 2022-06-09 LAB — IRON AND TIBC
Iron: 87 ug/dL (ref 28–170)
Saturation Ratios: 29 % (ref 10.4–31.8)
TIBC: 304 ug/dL (ref 250–450)
UIBC: 217 ug/dL

## 2022-06-09 LAB — POCT HEMOGLOBIN-HEMACUE: Hemoglobin: 10.6 g/dL — ABNORMAL LOW (ref 12.0–15.0)

## 2022-06-09 LAB — FERRITIN: Ferritin: 338 ng/mL — ABNORMAL HIGH (ref 11–307)

## 2022-06-09 MED ORDER — EPOETIN ALFA-EPBX 10000 UNIT/ML IJ SOLN: 15000.0000 [IU] | INTRAMUSCULAR | Status: DC

## 2022-06-09 MED ORDER — EPOETIN ALFA-EPBX 10000 UNIT/ML IJ SOLN
INTRAMUSCULAR | Status: AC
  Administered 2022-06-09: 15000 [IU] via SUBCUTANEOUS
  Filled 2022-06-09: qty 2

## 2022-06-10 LAB — IRON,TIBC AND FERRITIN PANEL
%SAT: 34
Ferritin: 539
Iron: 84
TIBC: 245
UIBC: 161

## 2022-06-10 LAB — CBC AND DIFFERENTIAL
HCT: 36 (ref 36–46)
Hemoglobin: 11.1 — AB (ref 12.0–16.0)
Neutrophils Absolute: 3.8

## 2022-06-23 ENCOUNTER — Ambulatory Visit (INDEPENDENT_AMBULATORY_CARE_PROVIDER_SITE_OTHER): Payer: Medicare Other | Admitting: Family Medicine

## 2022-06-23 ENCOUNTER — Other Ambulatory Visit: Payer: Self-pay

## 2022-06-23 ENCOUNTER — Ambulatory Visit (INDEPENDENT_AMBULATORY_CARE_PROVIDER_SITE_OTHER): Payer: Medicare Other

## 2022-06-23 VITALS — BP 118/64 | HR 55 | Ht 65.0 in | Wt 182.0 lb

## 2022-06-23 DIAGNOSIS — M25512 Pain in left shoulder: Secondary | ICD-10-CM

## 2022-06-23 DIAGNOSIS — M25511 Pain in right shoulder: Secondary | ICD-10-CM | POA: Diagnosis not present

## 2022-06-23 DIAGNOSIS — G8929 Other chronic pain: Secondary | ICD-10-CM

## 2022-06-23 DIAGNOSIS — M545 Low back pain, unspecified: Secondary | ICD-10-CM | POA: Diagnosis not present

## 2022-06-23 NOTE — Progress Notes (Signed)
Rubin Payor, PhD, LAT, ATC acting as a scribe for Clementeen Graham, MD.  Brandi Cooper is a 77 y.o. female who presents to [facility] at [facility] today for bilat shoulders and low back pain. Pt's last visit w/ Dr. Denyse Amass was on 08/05/21 for her neck and was advised to use a heating pad, TENS unit, and was referred to PT, completing 15 total visits (d/c on 5/30). Her last visit for her back was on 04/22/21 and treating this was added to her existing aquatic PT order.   Today, pt reports bilat shoulder pain ongoing since for a few months, R>L. She is R-hand dominate. No MOI. Pt locates pain to the anterior aspect of her shoulder. She notes hx of RC surgery on both shoulders.  Radiates: no Aggravates: any AROM, esp ER Treatments tried:  heat  Pt also c/o LBP. Pt had prior surgery in 1998. Pt locates pain to mainly on the R-side, but will travel across the whole low back.  She had significant benefit for aquatic physical therapy for this issue last year.  Aggravates: forward trunk flexion, household chores  Dx imaging: 04/22/21 L-spine XR  Pertinent review of systems: No fevers or chills  Relevant historical information: Hypertension and diabetes.   Exam:  BP 118/64   Pulse (!) 55   Ht 5\' 5"  (1.651 m)   Wt 182 lb (82.6 kg)   SpO2 97%   BMI 30.29 kg/m  General: Well Developed, well nourished, and in no acute distress.   MSK: Right shoulder: Normal-appearing Nontender. Limited range of motion abduction internal rotation. Positive Hawkins and Neer's test.   Negative Yergason's and speeds test. Strength reduced abduction 4/5 intact external and internal rotation.  L-spine: Normal appearing Nontender palpation midline.  Decreased lumbar motion.  Lower extremity strength is intact.  Lab and Radiology Results  Diagnostic Limited MSK Ultrasound of: Right shoulder Biceps tendon is intact. Subscapularis tendon intact.  Supraspinatus tendon intact.  Mild  subacromial bursitis. Infraspinatus tendon is intact. Impression: Subacromial bursitis   X-ray images right shoulder obtained today personally and independently interpreted Mild AC DJD.  No glenohumeral DJD.  No acute fractures are visible. Await formal radiology review   Assessment and Plan: 77 y.o. female with new right shoulder pain thought to be due to subacromial impingement.  She is a great candidate for physical therapy for this issue.  She would like to avoid an injection if possible.  Plan for trial of PT and recheck in about 8 weeks.  Chronic low back pain.  This is an acute exacerbation of chronic pain.  She did very well with aquatic physical therapy for this issue last year.  Will go ahead and retry aquatic PT now.  Recheck 8 weeks.   PDMP not reviewed this encounter. Orders Placed This Encounter  Procedures   DG Shoulder Right    Standing Status:   Future    Number of Occurrences:   1    Standing Expiration Date:   06/23/2023    Order Specific Question:   Reason for Exam (SYMPTOM  OR DIAGNOSIS REQUIRED)    Answer:   bilateral shoulder pain    Order Specific Question:   Preferred imaging location?    Answer:   [facility]   [place] LIMITED JOINT SPACE STRUCTURES UP BILAT(NO LINKED CHARGES)    Order Specific Question:   Reason for Exam (SYMPTOM  OR DIAGNOSIS REQUIRED)    Answer:   bilateral shoulder pain  Order Specific Question:   Preferred imaging location?    Answer:   [facility] referral to Physical Therapy    Referral Priority:   Routine    Referral Type:   Physical Medicine    Referral Reason:   Specialty Services Required    Requested Specialty:   Physical Therapy    Number of Visits Requested:   1   No orders of the defined types were placed in this encounter.    Discussed warning signs or symptoms. Please see discharge instructions. Patient expresses understanding.   The above documentation has been  reviewed and is accurate and complete Clementeen Graham, M.D.

## 2022-06-23 NOTE — Patient Instructions (Addendum)
Thank you for coming in today.   Please get an Xray today before you leave   I've referred you to Physical Therapy.  Let us know if you don't hear from them in one week.   Check back in 8 weeks 

## 2022-06-24 NOTE — Progress Notes (Signed)
Right shoulder x-ray shows some arthritis changes in the small joint at the top of the shoulder.

## 2022-07-07 ENCOUNTER — Ambulatory Visit (HOSPITAL_COMMUNITY)
Admission: RE | Admit: 2022-07-07 | Discharge: 2022-07-07 | Disposition: A | Discharge status: Home / Self Care | Payer: Medicare Other | Source: Ambulatory Visit | Attending: Nephrology | Admitting: Nephrology

## 2022-07-07 VITALS — BP 108/57 | HR 64 | Temp 97.1°F | Resp 17

## 2022-07-07 DIAGNOSIS — N183 Chronic kidney disease, stage 3 unspecified: Secondary | ICD-10-CM | POA: Diagnosis not present

## 2022-07-07 LAB — IRON AND TIBC
Iron: 74 ug/dL (ref 28–170)
Saturation Ratios: 24 % (ref 10.4–31.8)
TIBC: 314 ug/dL (ref 250–450)
UIBC: 240 ug/dL

## 2022-07-07 LAB — FERRITIN: Ferritin: 339 ng/mL — ABNORMAL HIGH (ref 11–307)

## 2022-07-07 LAB — POCT HEMOGLOBIN-HEMACUE: Hemoglobin: 10.2 g/dL — ABNORMAL LOW (ref 12.0–15.0)

## 2022-07-07 MED ORDER — EPOETIN ALFA-EPBX 10000 UNIT/ML IJ SOLN
INTRAMUSCULAR | Status: AC
  Filled 2022-07-07: qty 2

## 2022-07-07 MED ORDER — EPOETIN ALFA-EPBX 10000 UNIT/ML IJ SOLN
15000.0000 [IU] | INTRAMUSCULAR | Status: DC
  Administered 2022-07-07: 15000 [IU] via SUBCUTANEOUS

## 2022-07-27 ENCOUNTER — Other Ambulatory Visit: Payer: Self-pay | Admitting: Family Medicine

## 2022-08-02 ENCOUNTER — Ambulatory Visit (INDEPENDENT_AMBULATORY_CARE_PROVIDER_SITE_OTHER): Payer: Medicare Other | Admitting: Family Medicine

## 2022-08-02 VITALS — BP 105/66 | HR 82 | Temp 97.5°F | Ht 65.0 in | Wt 173.6 lb

## 2022-08-02 DIAGNOSIS — N183 Chronic kidney disease, stage 3 unspecified: Secondary | ICD-10-CM

## 2022-08-02 DIAGNOSIS — E1159 Type 2 diabetes mellitus with other circulatory complications: Secondary | ICD-10-CM

## 2022-08-02 DIAGNOSIS — E1122 Type 2 diabetes mellitus with diabetic chronic kidney disease: Secondary | ICD-10-CM | POA: Diagnosis not present

## 2022-08-02 DIAGNOSIS — J029 Acute pharyngitis, unspecified: Secondary | ICD-10-CM

## 2022-08-02 DIAGNOSIS — Z7984 Long term (current) use of oral hypoglycemic drugs: Secondary | ICD-10-CM

## 2022-08-02 DIAGNOSIS — I152 Hypertension secondary to endocrine disorders: Secondary | ICD-10-CM

## 2022-08-02 LAB — POCT RAPID STREP A (OFFICE): Rapid Strep A Screen: POSITIVE — AB

## 2022-08-02 LAB — POC COVID19 BINAXNOW: SARS Coronavirus 2 Ag: NEGATIVE

## 2022-08-02 MED ORDER — PROMETHAZINE-DM 6.25-15 MG/5ML PO SYRP: 5.0000 mL | ORAL_SOLUTION | Freq: Four times a day (QID) | ORAL | 0 refills | Status: DC | PRN

## 2022-08-02 MED ORDER — AMOXICILLIN 500 MG PO TABS: 500.0000 mg | ORAL_TABLET | Freq: Two times a day (BID) | ORAL | 0 refills | Status: DC

## 2022-08-02 NOTE — Assessment & Plan Note (Signed)
Blood pressure at goal on lisinopril 20 mg daily. 

## 2022-08-02 NOTE — Assessment & Plan Note (Signed)
Last A1c uncontrolled at 10.1.  We added on Januvia 25 mg daily and she is doing well with this.  She is also on Actos 45 mg daily.  She is coming back in a month to recheck A1c.

## 2022-08-02 NOTE — Patient Instructions (Addendum)
It was very nice to see you today!  You have strep throat.  Please start the amoxicillin.  Take the cough syrup as needed.  Make sure that you are getting plenty of fluids and staying well-hydrated.  Return if symptoms worsen or fail to improve.   Take care, Dr [name]  PLEASE NOTE:  If you had any lab tests, please let us know if you have not heard back within a few days. You may see your results on mychart before we have a chance to review them but we will give you a call once they are reviewed by [place].   If we ordered any referrals today, please let us know if you have not heard from their office within the next week.   If you had any urgent prescriptions sent in today, please check with the pharmacy within an hour of our visit to make sure the prescription was transmitted appropriately.   Please try these tips to maintain a healthy lifestyle:  Eat at least 3 REAL meals and 1-2 snacks per day.  Aim for no more than 5 hours between eating.  If you eat breakfast, please do so within one hour of getting up.   Each meal should contain half fruits/vegetables, one quarter protein, and one quarter carbs (no bigger than a computer mouse)  Cut down on sweet beverages. This includes juice, soda, and sweet tea.   Drink at least 1 glass of water with each meal and aim for at least 8 glasses per day  Exercise at least 150 minutes every week.

## 2022-08-02 NOTE — Progress Notes (Signed)
   Brandi Cooper is a 77 y.o. female who presents today for an office visit.  Assessment/Plan:  New/Acute Problems: Strep Pharyngitis  Rapid strep positive. Will start amoxicillin.  Also start promethazine-dextromethorphan cough syrup.  Encouraged hydration.  She can continue using over-the-counter meds as needed as well.  We discussed reasons to return to care.  Follow-up as needed.  Chronic Problems Addressed Today: Hypertension associated with diabetes (HCC) Blood pressure at goal on lisinopril 20 mg daily.  Type 2 diabetes mellitus with stage 3 chronic kidney disease, without long-term current use of insulin (HCC) Last A1c uncontrolled at 10.1.  We added on Januvia 25 mg daily and she is doing well with this.  She is also on Actos 45 mg daily.  She is coming back in a month to recheck A1c.     Subjective:  HPI:  See A/P for status of chronic conditions.  Patient is here with cough congestion, sore throat, ear pain, and sweating.  Her granddaughter has been recently sick with bronchiolitis. Patient's symptoms started a couple of weeks. Tried OTC medications without much improvement.        Objective:  Physical Exam: BP 105/66   Pulse 82   Temp (!) 97.5 F (36.4 C) (Temporal)   Ht 5\' 5"  (1.651 m)   Wt 173 lb 9.6 oz (78.7 kg)   SpO2 98%   BMI 28.89 kg/m   Gen: No acute distress, resting comfortably HEENT: OP erythematous.  No exudates CV: Regular rate and rhythm with no murmurs appreciated Pulm: Normal work of breathing, clear to auscultation bilaterally with no crackles, wheezes, or rhonchi Neuro: Grossly normal, moves all extremities Psych: Normal affect and thought content      Miara Emminger M. [name], MD 08/02/2022 11:44 AM

## 2022-08-04 ENCOUNTER — Encounter (HOSPITAL_COMMUNITY): Payer: Medicare Other

## 2022-08-08 ENCOUNTER — Other Ambulatory Visit: Payer: Self-pay | Admitting: Family Medicine

## 2022-08-11 ENCOUNTER — Ambulatory Visit: Payer: Medicare Other | Admitting: Family Medicine

## 2022-08-17 ENCOUNTER — Telehealth: Payer: Self-pay | Admitting: Family Medicine

## 2022-08-17 ENCOUNTER — Other Ambulatory Visit: Payer: Self-pay

## 2022-08-17 DIAGNOSIS — G8929 Other chronic pain: Secondary | ICD-10-CM

## 2022-08-17 NOTE — Telephone Encounter (Signed)
Called and discussed w/ pt. The prior referral was placed to the [facility] location, which was not want the pt wanted. New PT referral placed to [facility]; Traditional PT for her R shoulder and aquatic PT for LBP. Pt was provided the phone number to call tomorrow to get scheduled. She verbalized understanding.

## 2022-08-17 NOTE — Telephone Encounter (Signed)
Pt called to cancel follow up appt. Seems unclear on Physical Therapy. She has been receiving calls for traditional PT but is not interested in doing that. Pt would like to do water therapy at [facility] only as her daughter can bring her there.  If OK, order needed.

## 2022-08-18 ENCOUNTER — Ambulatory Visit: Payer: Medicare Other | Admitting: Family Medicine

## 2022-08-18 ENCOUNTER — Ambulatory Visit (HOSPITAL_COMMUNITY)
Admission: RE | Admit: 2022-08-18 | Discharge: 2022-08-18 | Disposition: A | Discharge status: Home / Self Care | Payer: Medicare Other | Source: Ambulatory Visit | Attending: Nephrology | Admitting: Nephrology

## 2022-08-18 VITALS — BP 118/56 | HR 69 | Temp 97.3°F | Resp 17

## 2022-08-18 DIAGNOSIS — N183 Chronic kidney disease, stage 3 unspecified: Secondary | ICD-10-CM

## 2022-08-18 LAB — POCT HEMOGLOBIN-HEMACUE: Hemoglobin: 9.2 g/dL — ABNORMAL LOW (ref 12.0–15.0)

## 2022-08-18 LAB — FERRITIN: Ferritin: 341 ng/mL — ABNORMAL HIGH (ref 11–307)

## 2022-08-18 LAB — IRON AND TIBC
Iron: 58 ug/dL (ref 28–170)
Saturation Ratios: 20 % (ref 10.4–31.8)
TIBC: 291 ug/dL (ref 250–450)
UIBC: 233 ug/dL

## 2022-08-18 MED ORDER — EPOETIN ALFA-EPBX 10000 UNIT/ML IJ SOLN
15000.0000 [IU] | INTRAMUSCULAR | Status: DC
  Administered 2022-08-18: 15000 [IU] via SUBCUTANEOUS

## 2022-08-18 MED ORDER — EPOETIN ALFA-EPBX 10000 UNIT/ML IJ SOLN
INTRAMUSCULAR | Status: AC
  Filled 2022-08-18: qty 1

## 2022-09-01 ENCOUNTER — Ambulatory Visit (INDEPENDENT_AMBULATORY_CARE_PROVIDER_SITE_OTHER): Payer: Medicare Other | Admitting: Family Medicine

## 2022-09-01 ENCOUNTER — Encounter: Payer: Self-pay | Admitting: Family Medicine

## 2022-09-01 VITALS — BP 107/66 | HR 67 | Temp 97.1°F | Ht 65.0 in | Wt 179.4 lb

## 2022-09-01 DIAGNOSIS — N183 Chronic kidney disease, stage 3 unspecified: Secondary | ICD-10-CM

## 2022-09-01 DIAGNOSIS — G8929 Other chronic pain: Secondary | ICD-10-CM

## 2022-09-01 DIAGNOSIS — M542 Cervicalgia: Secondary | ICD-10-CM

## 2022-09-01 DIAGNOSIS — E1159 Type 2 diabetes mellitus with other circulatory complications: Secondary | ICD-10-CM | POA: Diagnosis not present

## 2022-09-01 DIAGNOSIS — I152 Hypertension secondary to endocrine disorders: Secondary | ICD-10-CM | POA: Diagnosis not present

## 2022-09-01 DIAGNOSIS — Z7984 Long term (current) use of oral hypoglycemic drugs: Secondary | ICD-10-CM | POA: Diagnosis not present

## 2022-09-01 DIAGNOSIS — E1122 Type 2 diabetes mellitus with diabetic chronic kidney disease: Secondary | ICD-10-CM

## 2022-09-01 LAB — MICROALBUMIN / CREATININE URINE RATIO
Creatinine,U: 154.4 mg/dL
Microalb Creat Ratio: 0.9 mg/g (ref 0.0–30.0)
Microalb, Ur: 1.4 mg/dL (ref 0.0–1.9)

## 2022-09-01 LAB — HEMOGLOBIN A1C: Hgb A1c MFr Bld: 7.9 % — ABNORMAL HIGH (ref 4.6–6.5)

## 2022-09-01 NOTE — Progress Notes (Signed)
   Brandi Cooper is a 77 y.o. female who presents today for an office visit.  Assessment/Plan:  Chronic Problems Addressed Today: Type 2 diabetes mellitus with stage 3 chronic kidney disease, without long-term current use of insulin (HCC) Unable to obtain a point-of-care A1c today due to issues with getting enough blood.  Will check a lab draw A1c.  Continue Januvia 25 mg daily and Actos 45 mg daily.  We did discuss neck step would probably be a once weekly injectable medication.  She would like to hold off on this if possible but is open to the idea.  If A1c is not at goal would consider switching Januvia to [facility].  We need to avoid metformin due to CKD.  Sulfonylureas are an option however would like to avoid in older patients if possible.   Hypertension associated with diabetes (HCC) Blood pressure at goal on lisinopril 20 mg daily.  Chronic neck pain Recently had evaluation from ENT which did not find any significant abnormalities on her endoscopic evaluation.  Given chronicity of symptoms would be reasonable to check CT scan to rule out any other possible causes.  It is possible that her symptoms could be related to degenerative disease in her cervical spine though we will check CT scan as above to rule out any other possible causes.  If CT scan is negative, would have her follow back up with sports medicine.     Subjective:  HPI:  See A/P for status of chronic conditions.  Patient is here today for follow-up.  She was here 3 months ago for diabetes follow-up.  A1c at that time was 10.1.  We started Januvia 25 mg daily.  Continued Actos 45 mg daily.  She has done well with new meds.  No side effects.  Does admit to several dietary discretions.  She also still has ongoing issues with neck pain.  This been going on for multiple years.  We did have her see ENT a few months ago.  She had an endoscopic evaluation which was reassuring.  Apparently she was told it may be a strained muscle or  arthritis causing her symptoms.  She has more pain with certain motions such as turning her head.  Also worse at night.       Objective:  Physical Exam: BP 107/66   Pulse 67   Temp (!) 97.1 F (36.2 C) (Temporal)   Ht 5\' 5"  (1.651 m)   Wt 179 lb 6.4 oz (81.4 kg)   SpO2 100%   BMI 29.85 kg/m   Gen: No acute distress, resting comfortably CV: Regular rate and rhythm with no murmurs appreciated Pulm: Normal work of breathing, clear to auscultation bilaterally with no crackles, wheezes, or rhonchi Neuro: Grossly normal, moves all extremities Psych: Normal affect and thought content      Toba Claudio M. [name], MD 09/01/2022 9:57 AM

## 2022-09-01 NOTE — Assessment & Plan Note (Signed)
Recently had evaluation from ENT which did not find any significant abnormalities on her endoscopic evaluation.  Given chronicity of symptoms would be reasonable to check CT scan to rule out any other possible causes.  It is possible that her symptoms could be related to degenerative disease in her cervical spine though we will check CT scan as above to rule out any other possible causes.  If CT scan is negative, would have her follow back up with sports medicine.

## 2022-09-01 NOTE — Assessment & Plan Note (Signed)
Unable to obtain a point-of-care A1c today due to issues with getting enough blood.  Will check a lab draw A1c.  Continue Januvia 25 mg daily and Actos 45 mg daily.  We did discuss neck step would probably be a once weekly injectable medication.  She would like to hold off on this if possible but is open to the idea.  If A1c is not at goal would consider switching Januvia to [facility].  We need to avoid metformin due to CKD.  Sulfonylureas are an option however would like to avoid in older patients if possible.

## 2022-09-01 NOTE — Assessment & Plan Note (Signed)
Blood pressure at goal on lisinopril 20 mg daily. 

## 2022-09-01 NOTE — Patient Instructions (Signed)
It was very nice to see you today!  We will check blood work today for your A1c.  We will check a CT scan of the neck.  Return in about 3 months (around 12/02/2022) for Follow Up.   Take care, Dr [name]  PLEASE NOTE:  If you had any lab tests, please let us know if you have not heard back within a few days. You may see your results on mychart before we have a chance to review them but we will give you a call once they are reviewed by [place].   If we ordered any referrals today, please let us know if you have not heard from their office within the next week.   If you had any urgent prescriptions sent in today, please check with the pharmacy within an hour of our visit to make sure the prescription was transmitted appropriately.   Please try these tips to maintain a healthy lifestyle:  Eat at least 3 REAL meals and 1-2 snacks per day.  Aim for no more than 5 hours between eating.  If you eat breakfast, please do so within one hour of getting up.   Each meal should contain half fruits/vegetables, one quarter protein, and one quarter carbs (no bigger than a computer mouse)  Cut down on sweet beverages. This includes juice, soda, and sweet tea.   Drink at least 1 glass of water with each meal and aim for at least 8 glasses per day  Exercise at least 150 minutes every week.

## 2022-09-02 NOTE — Progress Notes (Signed)
A1c much better at 7.9.  Do not need to make any adjustments to her medication or treatment plan.  She can continue her current medications and we can recheck in 3 to 6 months.

## 2022-09-05 ENCOUNTER — Ambulatory Visit (HOSPITAL_BASED_OUTPATIENT_CLINIC_OR_DEPARTMENT_OTHER)
Admission: RE | Admit: 2022-09-05 | Discharge: 2022-09-05 | Disposition: A | Discharge status: Home / Self Care | Payer: Medicare Other | Source: Ambulatory Visit | Attending: Family Medicine | Admitting: Family Medicine

## 2022-09-05 DIAGNOSIS — M542 Cervicalgia: Secondary | ICD-10-CM | POA: Diagnosis present

## 2022-09-13 NOTE — Progress Notes (Signed)
Her CT scan shows several areas of degenerative change in her neck but no soft tissue swelling or masses.  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  She should follow-up with sports medicine orthopedics to discuss management. Please place referral if needed however she should be able to call to schedule appointment.

## 2022-09-14 ENCOUNTER — Other Ambulatory Visit: Payer: Self-pay | Admitting: *Deleted

## 2022-09-14 DIAGNOSIS — G8929 Other chronic pain: Secondary | ICD-10-CM

## 2022-09-15 ENCOUNTER — Encounter (HOSPITAL_COMMUNITY)
Admission: RE | Admit: 2022-09-15 | Discharge: 2022-09-15 | Disposition: A | Discharge status: Home / Self Care | Payer: Medicare Other | Source: Ambulatory Visit | Attending: Nephrology | Admitting: Nephrology

## 2022-09-15 VITALS — BP 106/53 | HR 60 | Temp 97.3°F | Resp 17

## 2022-09-15 DIAGNOSIS — N183 Chronic kidney disease, stage 3 unspecified: Secondary | ICD-10-CM | POA: Diagnosis not present

## 2022-09-15 LAB — IRON AND TIBC
Iron: 76 ug/dL (ref 28–170)
Saturation Ratios: 24 % (ref 10.4–31.8)
TIBC: 319 ug/dL (ref 250–450)
UIBC: 243 ug/dL

## 2022-09-15 LAB — FERRITIN: Ferritin: 350 ng/mL — ABNORMAL HIGH (ref 11–307)

## 2022-09-15 LAB — POCT HEMOGLOBIN-HEMACUE: Hemoglobin: 10.4 g/dL — ABNORMAL LOW (ref 12.0–15.0)

## 2022-09-15 MED ORDER — EPOETIN ALFA-EPBX 10000 UNIT/ML IJ SOLN
INTRAMUSCULAR | Status: AC
  Filled 2022-09-15: qty 2

## 2022-09-15 MED ORDER — EPOETIN ALFA-EPBX 10000 UNIT/ML IJ SOLN
15000.0000 [IU] | INTRAMUSCULAR | Status: DC
  Administered 2022-09-15: 15000 [IU] via SUBCUTANEOUS

## 2022-09-30 ENCOUNTER — Other Ambulatory Visit: Payer: Self-pay | Admitting: Family Medicine

## 2022-09-30 NOTE — Telephone Encounter (Signed)
Rx refill request approved per Dr. Corey's orders. 

## 2022-10-13 ENCOUNTER — Encounter (HOSPITAL_COMMUNITY)
Admission: RE | Admit: 2022-10-13 | Discharge: 2022-10-13 | Disposition: A | Discharge status: Home / Self Care | Payer: Medicare Other | Source: Ambulatory Visit | Attending: Nephrology | Admitting: Nephrology

## 2022-10-13 VITALS — BP 108/56 | HR 66 | Temp 97.3°F | Resp 17

## 2022-10-13 DIAGNOSIS — N183 Chronic kidney disease, stage 3 unspecified: Secondary | ICD-10-CM | POA: Diagnosis not present

## 2022-10-13 LAB — POCT HEMOGLOBIN-HEMACUE: Hemoglobin: 11.1 g/dL — ABNORMAL LOW (ref 12.0–15.0)

## 2022-10-13 MED ORDER — EPOETIN ALFA-EPBX 10000 UNIT/ML IJ SOLN
INTRAMUSCULAR | Status: AC
  Filled 2022-10-13: qty 2

## 2022-10-13 MED ORDER — EPOETIN ALFA-EPBX 10000 UNIT/ML IJ SOLN
15000.0000 [IU] | INTRAMUSCULAR | Status: DC
  Administered 2022-10-13: 15000 [IU] via SUBCUTANEOUS

## 2022-10-27 LAB — COMPREHENSIVE METABOLIC PANEL
Albumin: 4.2 (ref 3.5–5.0)
Calcium: 9 (ref 8.7–10.7)
eGFR: 22

## 2022-10-27 LAB — BASIC METABOLIC PANEL
BUN: 64 — AB (ref 4–21)
CO2: 22 (ref 13–22)
Chloride: 108 (ref 99–108)
Creatinine: 2.3 — AB (ref 0.5–1.1)
Glucose: 194
Potassium: 3.9 meq/L (ref 3.5–5.1)
Sodium: 139 (ref 137–147)

## 2022-10-27 LAB — VITAMIN D 25 HYDROXY (VIT D DEFICIENCY, FRACTURES): Vit D, 25-Hydroxy: 56.3

## 2022-11-10 ENCOUNTER — Ambulatory Visit (HOSPITAL_COMMUNITY)
Admission: RE | Admit: 2022-11-10 | Discharge: 2022-11-10 | Disposition: A | Discharge status: Home / Self Care | Payer: Medicare Other | Source: Ambulatory Visit | Attending: Nephrology | Admitting: Nephrology

## 2022-11-10 VITALS — BP 105/72 | HR 66 | Temp 97.2°F | Resp 17

## 2022-11-10 DIAGNOSIS — N183 Chronic kidney disease, stage 3 unspecified: Secondary | ICD-10-CM | POA: Insufficient documentation

## 2022-11-10 LAB — POCT HEMOGLOBIN-HEMACUE: Hemoglobin: 10.9 g/dL — ABNORMAL LOW (ref 12.0–15.0)

## 2022-11-10 LAB — FERRITIN: Ferritin: 250 ng/mL (ref 11–307)

## 2022-11-10 LAB — IRON AND TIBC
Iron: 62 ug/dL (ref 28–170)
Saturation Ratios: 19 % (ref 10.4–31.8)
TIBC: 330 ug/dL (ref 250–450)
UIBC: 268 ug/dL

## 2022-11-10 MED ORDER — EPOETIN ALFA-EPBX 10000 UNIT/ML IJ SOLN
15000.0000 [IU] | INTRAMUSCULAR | Status: DC
  Administered 2022-11-10: 15000 [IU] via SUBCUTANEOUS

## 2022-11-10 MED ORDER — EPOETIN ALFA-EPBX 10000 UNIT/ML IJ SOLN
INTRAMUSCULAR | Status: AC
  Filled 2022-11-10: qty 2

## 2022-11-25 ENCOUNTER — Other Ambulatory Visit: Payer: Self-pay | Admitting: Family Medicine

## 2022-12-02 ENCOUNTER — Encounter: Payer: Self-pay | Admitting: Family Medicine

## 2022-12-02 ENCOUNTER — Ambulatory Visit (INDEPENDENT_AMBULATORY_CARE_PROVIDER_SITE_OTHER): Payer: Medicare Other | Admitting: Family Medicine

## 2022-12-02 VITALS — BP 126/71 | HR 67 | Temp 97.3°F | Ht 65.0 in | Wt 180.2 lb

## 2022-12-02 DIAGNOSIS — E1122 Type 2 diabetes mellitus with diabetic chronic kidney disease: Secondary | ICD-10-CM

## 2022-12-02 DIAGNOSIS — Z794 Long term (current) use of insulin: Secondary | ICD-10-CM

## 2022-12-02 DIAGNOSIS — M1990 Unspecified osteoarthritis, unspecified site: Secondary | ICD-10-CM | POA: Diagnosis not present

## 2022-12-02 DIAGNOSIS — N183 Chronic kidney disease, stage 3 unspecified: Secondary | ICD-10-CM

## 2022-12-02 DIAGNOSIS — E1159 Type 2 diabetes mellitus with other circulatory complications: Secondary | ICD-10-CM

## 2022-12-02 DIAGNOSIS — M542 Cervicalgia: Secondary | ICD-10-CM | POA: Diagnosis not present

## 2022-12-02 DIAGNOSIS — G8929 Other chronic pain: Secondary | ICD-10-CM

## 2022-12-02 DIAGNOSIS — I152 Hypertension secondary to endocrine disorders: Secondary | ICD-10-CM

## 2022-12-02 LAB — POCT GLYCOSYLATED HEMOGLOBIN (HGB A1C): Hemoglobin A1C: 6.8 % — AB (ref 4.0–5.6)

## 2022-12-02 NOTE — Patient Instructions (Addendum)
It was very nice to see you today!  Your blood sugar is at goal.  We will not make any medication changes today.  I will refer you to see the orthopedic for your neck and back pain.  Return in about 6 months (around 06/01/2023) for Follow Up.   Take care, Dr [name]  PLEASE NOTE:  If you had any lab tests, please let us know if you have not heard back within a few days. You may see your results on mychart before we have a chance to review them but we will give you a call once they are reviewed by [place].   If we ordered any referrals today, please let us know if you have not heard from their office within the next week.   If you had any urgent prescriptions sent in today, please check with the pharmacy within an hour of our visit to make sure the prescription was transmitted appropriately.   Please try these tips to maintain a healthy lifestyle:  Eat at least 3 REAL meals and 1-2 snacks per day.  Aim for no more than 5 hours between eating.  If you eat breakfast, please do so within one hour of getting up.   Each meal should contain half fruits/vegetables, one quarter protein, and one quarter carbs (no bigger than a computer mouse)  Cut down on sweet beverages. This includes juice, soda, and sweet tea.   Drink at least 1 glass of water with each meal and aim for at least 8 glasses per day  Exercise at least 150 minutes every week.

## 2022-12-02 NOTE — Assessment & Plan Note (Addendum)
No red flags but till has ongoing issues. We checked a CT scan a few months ago that showed degenerative changes. She has been seeing sports medicine but would like to see a back specialist. We will refer orthopedics today.

## 2022-12-02 NOTE — Assessment & Plan Note (Signed)
A1c at goal today at 6.8. Continue januvia 25 mg daily and actos 45 mg daily. Recheck A1c in 3-6 months. Continue lifestyle modifications.

## 2022-12-02 NOTE — Progress Notes (Signed)
   Brandi Cooper is a 77 y.o. female who presents today for an office visit.  Assessment/Plan:  Chronic Problems Addressed Today: Type 2 diabetes mellitus with stage 3 chronic kidney disease, without long-term current use of insulin (HCC) A1c at goal today at 6.8. Continue januvia 25 mg daily and actos 45 mg daily. Recheck A1c in 3-6 months. Continue lifestyle modifications.   Hypertension associated with diabetes (HCC) At goal on lisinopril 20 mg daily.   Chronic neck pain No red flags but till has ongoing issues. We checked a CT scan a few months ago that showed degenerative changes. She has been seeing sports medicine but would like to see a back specialist. We will refer orthopedics today.   CKD (chronic kidney disease) stage 3, GFR 30-59 ml/min (HCC) Following with nephrology.  We need to avoid nephrotoxic medications including NSAIDs.  Will defer further pain management to orthopedics as above.      Subjective:  HPI:  See Assessment / plan for status of chronic condiitions. She is here today for follow up. She was last here about 3 months ago. Since our last visit. She has had ongoing issues with joint pain.        Objective:  Physical Exam: BP 126/71   Pulse 67   Temp (!) 97.3 F (36.3 C) (Temporal)   Ht 5\' 5"  (1.651 m)   Wt 180 lb 3.2 oz (81.7 kg)   SpO2 99%   BMI 29.99 kg/m   Gen: No acute distress, resting comfortably Neuro: Grossly normal, moves all extremities Psych: Normal affect and thought content      Brandi Cooper M. [name], MD 12/02/2022 11:58 AM

## 2022-12-02 NOTE — Assessment & Plan Note (Signed)
Following with nephrology.  We need to avoid nephrotoxic medications including NSAIDs.  Will defer further pain management to orthopedics as above.

## 2022-12-02 NOTE — Assessment & Plan Note (Signed)
At goal on lisinopril 20mg  daily.

## 2022-12-08 ENCOUNTER — Encounter (HOSPITAL_COMMUNITY)
Admission: RE | Admit: 2022-12-08 | Discharge: 2022-12-08 | Disposition: A | Discharge status: Home / Self Care | Payer: Medicare Other | Source: Ambulatory Visit | Attending: Nephrology | Admitting: Nephrology

## 2022-12-08 ENCOUNTER — Other Ambulatory Visit: Payer: Self-pay | Admitting: *Deleted

## 2022-12-08 VITALS — BP 107/53 | HR 69 | Temp 97.4°F | Resp 16

## 2022-12-08 DIAGNOSIS — N183 Chronic kidney disease, stage 3 unspecified: Secondary | ICD-10-CM | POA: Insufficient documentation

## 2022-12-08 LAB — IRON AND TIBC
Iron: 58 ug/dL (ref 28–170)
Saturation Ratios: 19 % (ref 10.4–31.8)
TIBC: 314 ug/dL (ref 250–450)
UIBC: 256 ug/dL

## 2022-12-08 LAB — POCT HEMOGLOBIN-HEMACUE: Hemoglobin: 10.8 g/dL — ABNORMAL LOW (ref 12.0–15.0)

## 2022-12-08 LAB — FERRITIN: Ferritin: 237 ng/mL (ref 11–307)

## 2022-12-08 MED ORDER — EPOETIN ALFA-EPBX 10000 UNIT/ML IJ SOLN: 15000.0000 [IU] | INTRAMUSCULAR | Status: DC

## 2022-12-08 MED ORDER — EPOETIN ALFA-EPBX 10000 UNIT/ML IJ SOLN
INTRAMUSCULAR | Status: AC
  Administered 2022-12-08: 15000 [IU] via SUBCUTANEOUS
  Filled 2022-12-08: qty 2

## 2022-12-15 ENCOUNTER — Encounter: Payer: Self-pay | Admitting: Family Medicine

## 2023-01-05 ENCOUNTER — Other Ambulatory Visit (INDEPENDENT_AMBULATORY_CARE_PROVIDER_SITE_OTHER): Payer: Medicare Other

## 2023-01-05 ENCOUNTER — Encounter (HOSPITAL_COMMUNITY): Payer: Medicare Other

## 2023-01-05 ENCOUNTER — Ambulatory Visit: Payer: Medicare Other | Admitting: Orthopedic Surgery

## 2023-01-05 DIAGNOSIS — G8929 Other chronic pain: Secondary | ICD-10-CM

## 2023-01-05 DIAGNOSIS — M542 Cervicalgia: Secondary | ICD-10-CM

## 2023-01-05 DIAGNOSIS — M545 Low back pain, unspecified: Secondary | ICD-10-CM | POA: Diagnosis not present

## 2023-01-05 DIAGNOSIS — M5416 Radiculopathy, lumbar region: Secondary | ICD-10-CM

## 2023-01-05 NOTE — Progress Notes (Signed)
Orthopedic Spine Surgery Office Note  Assessment: Patient is a 77 y.o. female with 2 issues:  1) neck pain that radiates into bilateral ulnar forearms and ulnar hands, possible C8 radiculopathy 2) low back pain that radiates into bilateral lateral thighs and legs, possible radiculopathy   Plan: -Explained that initially conservative treatment is tried as a significant number of patients may experience relief with these treatment modalities. Discussed that the conservative treatments include:  -activity modification  -physical therapy  -over the counter pain medications  -medrol dosepak  -cervical steroid injections -Patient has tried PT, Tylenol, narcotics with pain management -Can continue with Tylenol up to 1000 mg 3 times daily -Since her low back pain radiating into her bilateral legs is more significant than her cervical issue and she has no signs of myelopathy, recommended MRI of the lumbar spine to evaluate for radiculopathy -Patient should return to office in 4 weeks, x-rays at next visit: none   Patient expressed understanding of the plan and all questions were answered to the patient's satisfaction.   ___________________________________________________________________________   History:  Patient is a 77 y.o. female who presents today for cervical and lumbar spine.  Patient has had several years of low back pain that radiates into the bilateral lower extremities.  She feels it along the lateral aspect of the thighs into the legs.  She has a history of a L4/5 discectomy, per her report.  She says that pain has gotten worse with time.  She has tried multiple medications including narcotics.  She has been trying Tylenol without any relief.  She also has done physical therapy that did not give her any relief.  She has been doing this treatment for over 1 year now.  She feels pain on a daily basis.  She feels the pain with activity and at rest.  This pain is more significant than her  neck and radiating arm pain.  In regards to her cervical spine, she has had a C3-7 ACDF in the past.  She initially did well after surgery.  However, within the last 2 years, she has developed neck pain and periodic pain in her ulnar forearms that goes into her ulnar digits.  She gets numbness and paresthesias in that same distribution.  Pain is worse on the left side.  This pain is not as significant as her low back pain radiating into her legs.  She has had chronic issues with imbalance and uses a cane.  No issues with fine motor skills in her hands.   Weakness: Denies Difficulty with fine motor skills (e.g., buttoning shirts, handwriting): Denies Symptoms of imbalance: Yes, has had feelings of imbalance and uses a cane at baseline.  Has had the symptoms for several years.  No recent changes. Paresthesias and numbness: Yes, gets numbness and paresthesias in her ulnar forearms into her ulnar hand.  Also has numbness and paresthesias in the lateral aspect of her right thigh and leg.  No other numbness or paresthesias. Bowel or bladder incontinence: Denies Saddle anesthesia: Denies  Treatments tried: PT, Tylenol, narcotics with pain management  Review of systems: Denies fevers and chills, night sweats, unexplained weight loss, history of cancer, pain that wakes them at night  Past medical history: CKD DM (last A1C was 6.8 on 12/02/2022) GERD Glaucoma Neuropathy Chronic pain  Allergies: amitriptyline, NSAIDs  Past surgical history:  Hysterectomy Breast reduction C3-7 ACDF Knee cartilage surgery Lumbar disc surgery Bilateral rotator cuff surgery Trabeculectomy Tubal ligation Vitrectomy and cataract surgery Carpal tunnel release  Social history: Denies use of nicotine product (smoking, vaping, patches, smokeless) Alcohol use: Denies Denies recreational drug use  Physical Exam:  BMI of 30  General: no acute distress, appears stated age Neurologic: alert, answering questions  appropriately, following commands Respiratory: unlabored breathing on room air, symmetric chest rise Psychiatric: appropriate affect, normal cadence to speech   MSK (spine):  -Strength exam      Left  Right Grip strength                5/5  5/5 Interosseus   5/5   5/5 Wrist extension  5/5  5/5 Wrist flexion   5/5  5/5 Elbow flexion   5/5  5/5 Deltoid    5/5  5/5  EHL    5/5  5/5 TA    5/5  5/5 GSC    5/5  5/5 Knee extension  5/5  5/5 Hip flexion   5/5  5/5  -Sensory exam    Sensation intact to light touch in L3-S1 nerve distributions of bilateral lower extremities  Sensation intact to light touch in C5-T1 nerve distributions of bilateral upper extremities  -Brachioradialis DTR: 1/4 on the left, 1/4 on the right -Biceps DTR: 1/4 on the left, 1/4 on the right -Achilles DTR: 1/4 on the left, 1/4 on the right -Patellar tendon DTR: 1/4 on the left, 1/4 on the right  -Spurling: Negative bilaterally -Hoffman sign: Negative bilaterally -Clonus: No beats bilaterally -Interosseous wasting: None seen -Grip and release test: Negative  Left shoulder exam: No pain through range of motion Right shoulder exam: No pain through range of motion Left hip exam: No pain through range of motion Right hip exam: No pain through range of motion  Tinel's at wrist: Negative bilaterally Phalen's at wrist: Negative bilaterally Durkan's: Negative bilaterally  Tinel's at elbow: Negative bilaterally  Imaging: XRs of the cervical spine from 01/05/2023 were independently reviewed and interpreted, showing oblique orientation to anterior cervical plate from B1-D1.  No lucency seen around the screws.  Fusion mass appearing across the former disc spaces.  Disc height loss at C7/T1.  No evidence of instability on flexion/extension views.  No fracture or dislocation seen.  CT cervical spine from 09/05/2022 was independently reviewed and interpreted, showing C3-7 anterior cervical discectomy and fusion.  There appears to be fusion mass across those levels in the former disc spaces. The plate is adjacent to the C2/3 disc space cranially and there is an osteophyte bridging over the plate. No lucency seen around the screws. Disc height loss at C7/T1 and T1/2.   XRs of the lumbar spine from 01/05/2023 were independently reviewed and interpreted, showing disc height loss at L3/4, L4/5, L5/S1.  No evidence of instability on flexion/extension views.  No fracture or dislocation seen.   Patient name: Brandi Cooper Patient MRN: [id number] Date of visit: 01/05/23

## 2023-01-12 ENCOUNTER — Ambulatory Visit (HOSPITAL_COMMUNITY)
Admission: RE | Admit: 2023-01-12 | Discharge: 2023-01-12 | Disposition: A | Discharge status: Home / Self Care | Payer: Medicare Other | Source: Ambulatory Visit | Attending: Nephrology | Admitting: Nephrology

## 2023-01-12 VITALS — BP 111/57 | HR 63 | Temp 97.5°F | Resp 17

## 2023-01-12 DIAGNOSIS — N183 Chronic kidney disease, stage 3 unspecified: Secondary | ICD-10-CM | POA: Diagnosis present

## 2023-01-12 LAB — POCT HEMOGLOBIN-HEMACUE: Hemoglobin: 10.4 g/dL — ABNORMAL LOW (ref 12.0–15.0)

## 2023-01-12 LAB — IRON AND TIBC
Iron: 60 ug/dL (ref 28–170)
Saturation Ratios: 19 % (ref 10.4–31.8)
TIBC: 318 ug/dL (ref 250–450)
UIBC: 258 ug/dL

## 2023-01-12 LAB — FERRITIN: Ferritin: 264 ng/mL (ref 11–307)

## 2023-01-12 MED ORDER — EPOETIN ALFA-EPBX 10000 UNIT/ML IJ SOLN
INTRAMUSCULAR | Status: AC
  Administered 2023-01-12: 15000 [IU] via SUBCUTANEOUS
  Filled 2023-01-12: qty 2

## 2023-01-12 MED ORDER — EPOETIN ALFA-EPBX 10000 UNIT/ML IJ SOLN: 15000.0000 [IU] | INTRAMUSCULAR | Status: DC

## 2023-01-20 ENCOUNTER — Ambulatory Visit: Payer: Medicare Other

## 2023-01-20 VITALS — Wt 180.0 lb

## 2023-01-20 DIAGNOSIS — Z0000 Encounter for general adult medical examination without abnormal findings: Secondary | ICD-10-CM | POA: Diagnosis not present

## 2023-01-20 NOTE — Progress Notes (Signed)
Subjective:   Brandi Cooper is a 77 y.o. female who presents for Medicare Annual (Subsequent) preventive examination.  Visit Complete: Virtual I connected with  Fonnie Mu on 01/20/23 by a audio enabled telemedicine application and verified that I am speaking with the correct person using two identifiers.  Patient Location: Home  Provider Location: Office/Clinic  I discussed the limitations of evaluation and management by telemedicine. The patient expressed understanding and agreed to proceed.  Vital Signs: Because this visit was a virtual/telehealth visit, some criteria may be missing or patient reported. Any vitals not documented were not able to be obtained and vitals that have been documented are patient reported.  Cardiac Risk Factors include: advanced age (>44men, >82 women);dyslipidemia;diabetes mellitus;hypertension     Objective:    Today's Vitals   01/20/23 1444  Weight: 180 lb (81.6 kg)   Body mass index is 29.95 kg/m.     12/10/2021    1:08 PM 03/25/2021   11:14 AM 11/27/2020    1:24 PM 10/12/2019    2:22 PM  Advanced Directives  Does Patient Have a Medical Advance Directive? No No No No  Would patient like information on creating a medical advance directive? No - Patient declined Yes (MAU/Ambulatory/Procedural Areas - Information given) Yes (MAU/Ambulatory/Procedural Areas - Information given) Yes (ED - Information included in AVS)    Current Medications (verified) Outpatient Encounter Medications as of 01/20/2023  Medication Sig   acetaZOLAMIDE (DIAMOX) 250 MG tablet    Ascorbic Acid (VITAMIN C) 1000 MG tablet Take 1,000 mg by mouth daily.   aspirin 81 MG tablet Take 81 mg by mouth daily.   atorvastatin (LIPITOR) 10 MG tablet Take 1 tablet (10 mg total) by mouth daily.   Biotin 5000 MCG CAPS Take by mouth.   brimonidine (ALPHAGAN) 0.2 % ophthalmic solution 3 (three) times daily.   Cholecalciferol (VITAMIN D3) 50 MCG (2000 UT) capsule Take by mouth.    dorzolamide-timolol (COSOPT) 22.3-6.8 MG/ML ophthalmic solution 1 drop 2 (two) times daily.   GNP CINNAMON PO Take by mouth. Takes 2000mg  2 capfuls daily   JANUVIA 25 MG tablet TAKE 1 TABLET(25 MG) BY MOUTH DAILY   lisinopril (ZESTRIL) 20 MG tablet TAKE 1 TABLET(20 MG) BY MOUTH DAILY   Omega-3 Fatty Acids (FISH OIL) 1200 MG CAPS Take 2 capsules by mouth.   pioglitazone (ACTOS) 45 MG tablet TAKE 1 TABLET(45 MG) BY MOUTH DAILY   ROCKLATAN 0.02-0.005 % SOLN Apply 1 drop to eye at bedtime.   sodium bicarbonate 650 MG tablet Take 1,300 mg by mouth 2 (two) times daily.   tiZANidine (ZANAFLEX) 2 MG tablet TAKE 1 TO 2 TABLETS(2 TO 4 MG) BY MOUTH EVERY 8 HOURS AS NEEDED FOR MUSCLE SPASMS   torsemide (DEMADEX) 20 MG tablet TAKE 1 TABLET(20 MG) BY MOUTH TWICE DAILY   [DISCONTINUED] promethazine-dextromethorphan (PROMETHAZINE-DM) 6.25-15 MG/5ML syrup Take 5 mLs by mouth 4 (four) times daily as needed.   [DISCONTINUED] timolol (BETIMOL) 0.5 % ophthalmic solution Place 1 drop into both eyes 2 (two) times daily.   No facility-administered encounter medications on file as of 01/20/2023.    Allergies (verified) Amitriptyline and Nsaids   History: Past Medical History:  Diagnosis Date   Chronic kidney disease    Diabetes mellitus without complication (HCC)    GERD (gastroesophageal reflux disease)    Glaucoma    Heart disease    Past Surgical History:  Procedure Laterality Date   ABDOMINAL HYSTERECTOMY     blood transfusion  BREAST SURGERY     Breast Reduction   CERVICAL FUSION     EYE SURGERY     KNEE CARTILAGE SURGERY     LUMBAR DISC SURGERY     REDUCTION MAMMAPLASTY     RETINOPATHY SURGERY     SHOULDER SURGERY Bilateral    Rotator Cuff   TRABECULECTOMY     TUBAL LIGATION     VITRECTOMY AND CATARACT     WRIST SURGERY     Carpal Tunnel   Family History  Problem Relation Age of Onset   Diabetes Mother    Hypertension Mother    Stroke Mother    Diabetes Father    Arthritis Sister     Cancer Sister    Heart attack Sister    Cancer Brother    Hypertension Daughter    Cancer Son    Stroke Maternal Grandmother    Hypertension Maternal Grandmother    Diabetes Paternal Grandmother    Heart attack Paternal Grandfather    Cancer Sister    Cancer Sister    Stroke Sister    Hypertension Sister    Diabetes Brother    Kidney disease Brother    Social History   Socioeconomic History   Marital status: Married    Spouse name: Not on file   Number of children: Not on file   Years of education: Not on file   Highest education level: Not on file  Occupational History   Occupation: retired  Tobacco Use   Smoking status: Never   Smokeless tobacco: Never  Vaping Use   Vaping status: Never Used  Substance and Sexual Activity   Alcohol use: Never   Drug use: Never   Sexual activity: Not on file  Other Topics Concern   Not on file  Social History Narrative   Not on file   Social Determinants of Health   Financial Resource Strain: Low Risk  (01/20/2023)   Overall Financial Resource Strain (CARDIA)    Difficulty of Paying Living Expenses: Not hard at all  Food Insecurity: No Food Insecurity (01/20/2023)   Hunger Vital Sign    Worried About Running Out of Food in the Last Year: Never true    Ran Out of Food in the Last Year: Never true  Transportation Needs: No Transportation Needs (01/20/2023)   PRAPARE - Administrator, Civil Service (Medical): No    Lack of Transportation (Non-Medical): No  Physical Activity: Inactive (01/20/2023)   Exercise Vital Sign    Days of Exercise per Week: 0 days    Minutes of Exercise per Session: 0 min  Stress: No Stress Concern Present (01/20/2023)   [employer] of Occupational Health - Occupational Stress Questionnaire    Feeling of Stress : Not at all  Social Connections: Moderately Integrated (01/20/2023)   Social Connection and Isolation Panel [NHANES]    Frequency of Communication with Friends and Family: More  than three times a week    Frequency of Social Gatherings with Friends and Family: Three times a week    Attends Religious Services: 1 to 4 times per year    Active Member of Clubs or Organizations: No    Attends Banker Meetings: Never    Marital Status: Married    Tobacco Counseling Counseling given: Not Answered   Clinical Intake:  Pre-visit preparation completed: Yes  Pain : No/denies pain     BMI - recorded: 29.95 Nutritional Status: BMI 25 -29 Overweight Nutritional Risks: None Diabetes:  Yes CBG done?: No Did pt. bring in CBG monitor from home?: No  How often do you need to have someone help you when you read instructions, pamphlets, or other written materials from your doctor or pharmacy?: 1 - Never  Interpreter Needed?: No  Information entered by :: Lanier Ensign, LPN   Activities of Daily Living    01/20/2023    2:46 PM  In your present state of health, do you have any difficulty performing the following activities:  Hearing? 0  Vision? 0  Difficulty concentrating or making decisions? 0  Walking or climbing stairs? 0  Comment due to back  Dressing or bathing? 0  Doing errands, shopping? 0  Preparing Food and eating ? N  Using the Toilet? N  In the past six months, have you accidently leaked urine? Y  Do you have problems with loss of bowel control? Y  Comment wears a pad  Managing your Medications? N  Managing your Finances? N  Housekeeping or managing your Housekeeping? N    Patient Care Team: Ardith Dark, MD as PCP - General (Family Medicine)  Indicate any recent Medical Services you may have received from other than [facility] providers in the past year (date may be approximate).     Assessment:   This is a routine wellness examination for [place].  Hearing/Vision screen Hearing Screening - Comments:: Pt denies any hearing issues  Vision Screening - Comments:: Pt follows up with Dr  Dione Booze for annual eye exams    Goals  Addressed             This Visit's Progress    Patient Stated       Lose weight        Depression Screen    01/20/2023    2:51 PM 12/02/2022   11:13 AM 09/01/2022    9:20 AM 08/02/2022   11:21 AM 03/05/2022   11:46 AM 02/10/2022    9:07 AM 12/10/2021    1:10 PM  PHQ 2/9 Scores  PHQ - 2 Score 0 0 0 0 0 0 0    Fall Risk    01/20/2023    3:00 PM 12/02/2022   11:13 AM 09/01/2022    9:20 AM 08/02/2022   11:21 AM 06/02/2022    9:19 AM  Fall Risk   Falls in the past year? 0 0 0 0 0  Number falls in past yr: 0 0 0 0 0  Injury with Fall? 0 0 0 0 0  Risk for fall due to : Impaired balance/gait;Impaired mobility;Impaired vision Impaired balance/gait Impaired balance/gait;No Fall Risks No Fall Risks;Impaired balance/gait Impaired balance/gait;No Fall Risks  Risk for fall due to: Comment  use a cane to walk     Follow up Falls prevention discussed        MEDICARE RISK AT HOME: Medicare Risk at Home Any stairs in or around the home?: No If so, are there any without handrails?: No Home free of loose throw rugs in walkways, pet beds, electrical cords, etc?: Yes Adequate lighting in your home to reduce risk of falls?: Yes Life alert?: No Use of a cane, walker or w/c?: Yes Grab bars in the bathroom?: No Shower chair or bench in shower?: No Elevated toilet seat or a handicapped toilet?: No  TIMED UP AND GO:  Was the test performed?  No    Cognitive Function:        01/20/2023    3:00 PM 12/10/2021    1:14 PM  11/27/2020    1:30 PM 10/12/2019    2:27 PM  6CIT Screen  What Year? 0 points 0 points 0 points 0 points  What month? 0 points 0 points 0 points 0 points  What time? 0 points 0 points 0 points 0 points  Count back from 20 0 points 0 points 0 points 0 points  Months in reverse 0 points 0 points 0 points 0 points  Repeat phrase 0 points 0 points 0 points 4 points  Total Score 0 points 0 points 0 points 4 points    Immunizations Immunization History  Administered  Date(s) Administered   Moderna SARS-COV2 Booster Vaccination 09/17/2020   Moderna Sars-Covid-2 Vaccination 08/13/2019, 09/10/2019    TDAP status: Due, Education has been provided regarding the importance of this vaccine. Advised may receive this vaccine at local pharmacy or Health Dept. Aware to provide a copy of the vaccination record if obtained from local pharmacy or Health Dept. Verbalized acceptance and understanding.  Flu Vaccine status: Declined, Education has been provided regarding the importance of this vaccine but patient still declined. Advised may receive this vaccine at local pharmacy or Health Dept. Aware to provide a copy of the vaccination record if obtained from local pharmacy or Health Dept. Verbalized acceptance and understanding.  Pneumococcal vaccine status: Declined,  Education has been provided regarding the importance of this vaccine but patient still declined. Advised may receive this vaccine at local pharmacy or Health Dept. Aware to provide a copy of the vaccination record if obtained from local pharmacy or Health Dept. Verbalized acceptance and understanding.   Covid-19 vaccine status: Declined, Education has been provided regarding the importance of this vaccine but patient still declined. Advised may receive this vaccine at local pharmacy or Health Dept.or vaccine clinic. Aware to provide a copy of the vaccination record if obtained from local pharmacy or Health Dept. Verbalized acceptance and understanding.  Qualifies for Shingles Vaccine? Yes   Zostavax completed No   Shingrix Completed?: No.    Education has been provided regarding the importance of this vaccine. Patient has been advised to call insurance company to determine out of pocket expense if they have not yet received this vaccine. Advised may also receive vaccine at local pharmacy or Health Dept. Verbalized acceptance and understanding.  Screening Tests Health Maintenance  Topic Date Due   INFLUENZA  VACCINE  Never done   DTaP/Tdap/Td (1 - Tdap) Never done   Zoster Vaccines- Shingrix  Never done   Pneumonia Vaccine 39+ Years old  Never done   FOOT EXAM  09/26/2019   OPHTHALMOLOGY EXAM  07/23/2021   Hepatitis C Screening  12/02/2023 (Originally 08/30/1963)   HEMOGLOBIN A1C  06/01/2023   Diabetic kidney evaluation - Urine ACR  09/01/2023   Diabetic kidney evaluation - eGFR measurement  10/27/2023   Medicare Annual Wellness (AWV)  01/20/2024   DEXA SCAN  Completed   HPV VACCINES  Aged Out   Colonoscopy  Discontinued   COVID-19 Vaccine  Discontinued    Health Maintenance  Health Maintenance Due  Topic Date Due   INFLUENZA VACCINE  Never done   DTaP/Tdap/Td (1 - Tdap) Never done   Zoster Vaccines- Shingrix  Never done   Pneumonia Vaccine 53+ Years old  Never done   FOOT EXAM  09/26/2019   OPHTHALMOLOGY EXAM  07/23/2021    Colorectal cancer screening: No longer required.   Mammogram status: Completed 02/25/20. Repeat every year  Bone Density status: Completed 02/01/20. Results reflect: Bone density results:  NORMAL. Repeat every 2 years.  Additional Screening:  Hepatitis C Screening: does qualify;  Vision Screening: Recommended annual ophthalmology exams for early detection of glaucoma and other disorders of the eye. Is the patient up to date with their annual eye exam?  No  Who is the provider or what is the name of the office in which the patient attends annual eye exams? Dr Dione Booze  If pt is not established with a provider, would they like to be referred to a provider to establish care? No .   Dental Screening: Recommended annual dental exams for proper oral hygiene  Diabetic Foot Exam: Diabetic Foot Exam: Overdue, Pt has been advised about the importance in completing this exam. Pt is scheduled for diabetic foot exam on next appt .  Community Resource Referral / Chronic Care Management: CRR required this visit?  No   CCM required this visit?  No     Plan:     I  have personally reviewed and noted the following in the patient's chart:   Medical and social history Use of alcohol, tobacco or illicit drugs  Current medications and supplements including opioid prescriptions. Patient is not currently taking opioid prescriptions. Functional ability and status Nutritional status Physical activity Advanced directives List of other physicians Hospitalizations, surgeries, and ER visits in previous 12 months Vitals Screenings to include cognitive, depression, and falls Referrals and appointments  In addition, I have reviewed and discussed with patient certain preventive protocols, quality metrics, and best practice recommendations. A written personalized care plan for preventive services as well as general preventive health recommendations were provided to patient.     Marzella Schlein, LPN   16/03/958   After Visit Summary: (Declined) Due to this being a telephonic visit, with patients personalized plan was offered to patient but patient Declined AVS at this time   Nurse Notes: Pt request a motor scooter to aid in mobility

## 2023-01-20 NOTE — Patient Instructions (Signed)
Ms. Brandi Cooper , Thank you for taking time to come for your Medicare Wellness Visit. I appreciate your ongoing commitment to your health goals. Please review the following plan we discussed and let me know if I can assist you in the future.   Referrals/Orders/Follow-Ups/Clinician Recommendations: Each day, aim for 6 glasses of water, plenty of protein in your diet and try to get up and walk/ stretch every hour for 5-10 minutes at a time.   Aim for 30 minutes of exercise or brisk walking, 6-8 glasses of water, and 5 servings of fruits and vegetables each day.   This is a list of the screening recommended for you and due dates:  Health Maintenance  Topic Date Due   Flu Shot  Never done   DTaP/Tdap/Td vaccine (1 - Tdap) Never done   Zoster (Shingles) Vaccine  Never done   Pneumonia Vaccine  Never done   Complete foot exam   09/26/2019   Eye exam for diabetics  07/23/2021   Hepatitis C Screening  12/02/2023*   Hemoglobin A1C  06/01/2023   Yearly kidney health urinalysis for diabetes  09/01/2023   Yearly kidney function blood test for diabetes  10/27/2023   Medicare Annual Wellness Visit  01/20/2024   DEXA scan (bone density measurement)  Completed   HPV Vaccine  Aged Out   Colon Cancer Screening  Discontinued   COVID-19 Vaccine  Discontinued  *Topic was postponed. The date shown is not the original due date.    Advanced directives: (Declined) Advance directive discussed with you today. Even though you declined this today, please call our office should you change your mind, and we can give you the proper paperwork for you to fill out.  Next Medicare Annual Wellness Visit scheduled for next year: Yes

## 2023-01-24 ENCOUNTER — Ambulatory Visit
Admission: RE | Admit: 2023-01-24 | Discharge: 2023-01-24 | Disposition: A | Discharge status: Home / Self Care | Payer: Medicare Other | Source: Ambulatory Visit | Attending: Orthopedic Surgery

## 2023-01-24 DIAGNOSIS — M5416 Radiculopathy, lumbar region: Secondary | ICD-10-CM

## 2023-01-25 ENCOUNTER — Telehealth: Payer: Self-pay

## 2023-01-25 NOTE — Telephone Encounter (Signed)
Note attached to telephone encounter

## 2023-02-02 ENCOUNTER — Encounter (HOSPITAL_COMMUNITY): Payer: Medicare Other

## 2023-02-02 ENCOUNTER — Ambulatory Visit (INDEPENDENT_AMBULATORY_CARE_PROVIDER_SITE_OTHER): Payer: Medicare Other | Admitting: Orthopedic Surgery

## 2023-02-02 DIAGNOSIS — Z7409 Other reduced mobility: Secondary | ICD-10-CM

## 2023-02-02 MED ORDER — PREGABALIN 25 MG PO CAPS: 25.0000 mg | ORAL_CAPSULE | Freq: Two times a day (BID) | ORAL | 0 refills | Status: DC

## 2023-02-02 NOTE — Progress Notes (Signed)
Orthopedic Spine Surgery Office Note   Assessment: Patient is a 77 y.o. female with 2 issues:   1) Low back pain that radiates into the bilateral lateral aspects of the thighs and legs.  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  After covering these options, she wanted to try Lyrica.  This was prescribed to her today -For her right side trochanteric bursitis, talked about treatment options as well.  I talked about physical therapy, anti-inflammatories, and injections.  She had objections to all those treatment options and was not interested in trying any of those -Patient should return to office in 6 weeks, x-rays at next visit: none     -Patient has been having difficulty with ambulation as a result of her pain.  She said she can only walk very short distances now.  She does not do long trips or does not leave her house much because of this decrease in mobility.  She inquired about an Mining engineer wheelchair.  She said she has had bilateral carpal tunnel surgery and symptoms are returning in her hands.  She is also had rotator cuff surgery and does not have the strength to power a manual wheelchair.  A prescription was provided to her for an electric wheelchair   ___________________________________________________________________________     History:   Patient is a 77 y.o. female who presents today for follow-up on her lumbar spine.  She states she still having significant back pain that radiates into the bilateral lower extremities.  She feels it along the lateral aspect of the thighs and legs.  Her back pain is more significant than her leg pain.  She describes multiple instances where doing  simple household task aggravate her back pain.  She says doing things like washing the dishes will cause her severe pain and she will have to quit doing them and rest for a period of time before trying again.  She also wanted talk about her right lateral hip pain.  This is been going on for the last couple of months.  She feels it on the lateral aspect of the hip.  She says it feels different than the radiating leg pain.  She has trouble sleeping at night on that side because of the pain.  There was no trauma or injury that preceded the onset of this pain.    Treatments tried: PT, Tylenol, narcotics with pain management    Physical Exam:   General: no acute distress, appears stated age Neurologic: alert, answering questions appropriately, following commands Respiratory: unlabored breathing on room air, symmetric chest rise Psychiatric: appropriate affect, normal cadence to speech     MSK (spine):   -Strength exam                                                   Left                  Right   EHL  5/5                  5/5 TA                                 5/5                  5/5 GSC                             5/5                  5/5 Knee extension            5/5                  5/5 Hip flexion                    5/5                  5/5   -Sensory exam                           Sensation intact to light touch in L3-S1 nerve distributions of bilateral lower extremities  -Achilles DTR: 1/4 on the left, 1/4 on the right -Patellar tendon DTR: 1/4 on the left, 1/4 on the right   Left hip exam: No pain through range of motion Right hip exam: No pain through range of motion, negative Stinchfield, negative FABER, tender to palpation over the trochanteric bursa, no tenderness to palpation over the remainder of the hip   Imaging: XRs of the lumbar spine from 01/05/2023 were independently reviewed and interpreted, showing disc height loss at L3/4, L4/5,  L5/S1.  No evidence of instability on flexion/extension views.  No fracture or dislocation seen.  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  DDD at L3/4, L4/5, L5/S1.  Modic changes noted within the endplates at L3/4 and L4/5.  Fatty atrophy seen within the lower lumbar paraspinal muscles.     Patient name: Brandi Cooper Patient MRN: [id number] Date of visit: 02/02/23

## 2023-02-06 ENCOUNTER — Other Ambulatory Visit: Payer: Self-pay | Admitting: Family Medicine

## 2023-02-09 ENCOUNTER — Ambulatory Visit (HOSPITAL_COMMUNITY)
Admission: RE | Admit: 2023-02-09 | Discharge: 2023-02-09 | Disposition: A | Discharge status: Home / Self Care | Payer: Medicare Other | Source: Ambulatory Visit | Attending: Nephrology | Admitting: Nephrology

## 2023-02-09 VITALS — BP 115/71 | HR 59 | Temp 97.6°F | Resp 17

## 2023-02-09 DIAGNOSIS — N183 Chronic kidney disease, stage 3 unspecified: Secondary | ICD-10-CM | POA: Diagnosis present

## 2023-02-09 LAB — FERRITIN: Ferritin: 212 ng/mL (ref 11–307)

## 2023-02-09 LAB — IRON AND TIBC
Iron: 60 ug/dL (ref 28–170)
Saturation Ratios: 18 % (ref 10.4–31.8)
TIBC: 336 ug/dL (ref 250–450)
UIBC: 276 ug/dL

## 2023-02-09 LAB — POCT HEMOGLOBIN-HEMACUE: Hemoglobin: 10.5 g/dL — ABNORMAL LOW (ref 12.0–15.0)

## 2023-02-09 MED ORDER — EPOETIN ALFA-EPBX 10000 UNIT/ML IJ SOLN
INTRAMUSCULAR | Status: AC
  Filled 2023-02-09: qty 2

## 2023-02-09 MED ORDER — EPOETIN ALFA-EPBX 10000 UNIT/ML IJ SOLN
15000.0000 [IU] | INTRAMUSCULAR | Status: DC
  Administered 2023-02-09: 15000 [IU] via SUBCUTANEOUS

## 2023-02-13 ENCOUNTER — Other Ambulatory Visit: Payer: Self-pay | Admitting: Family Medicine

## 2023-03-10 ENCOUNTER — Ambulatory Visit (HOSPITAL_COMMUNITY)
Admission: RE | Admit: 2023-03-10 | Discharge: 2023-03-10 | Disposition: A | Discharge status: Home / Self Care | Payer: Medicare Other | Source: Ambulatory Visit | Attending: Nephrology | Admitting: Nephrology

## 2023-03-10 VITALS — BP 117/68 | HR 68 | Temp 97.5°F | Resp 17

## 2023-03-10 DIAGNOSIS — N183 Chronic kidney disease, stage 3 unspecified: Secondary | ICD-10-CM | POA: Diagnosis present

## 2023-03-10 LAB — IRON AND TIBC
Iron: 84 ug/dL (ref 28–170)
Saturation Ratios: 27 % (ref 10.4–31.8)
TIBC: 309 ug/dL (ref 250–450)
UIBC: 225 ug/dL

## 2023-03-10 LAB — FERRITIN: Ferritin: 219 ng/mL (ref 11–307)

## 2023-03-10 LAB — POCT HEMOGLOBIN-HEMACUE: Hemoglobin: 9.9 g/dL — ABNORMAL LOW (ref 12.0–15.0)

## 2023-03-10 MED ORDER — EPOETIN ALFA-EPBX 10000 UNIT/ML IJ SOLN
INTRAMUSCULAR | Status: AC
  Filled 2023-03-10: qty 2

## 2023-03-10 MED ORDER — EPOETIN ALFA-EPBX 10000 UNIT/ML IJ SOLN
15000.0000 [IU] | INTRAMUSCULAR | Status: DC
  Administered 2023-03-10: 15000 [IU] via SUBCUTANEOUS

## 2023-03-17 ENCOUNTER — Ambulatory Visit: Payer: Medicare Other | Admitting: Orthopedic Surgery

## 2023-03-17 DIAGNOSIS — M5416 Radiculopathy, lumbar region: Secondary | ICD-10-CM

## 2023-03-17 MED ORDER — PREGABALIN 25 MG PO CAPS: 25.0000 mg | ORAL_CAPSULE | Freq: Two times a day (BID) | ORAL | 2 refills | Status: DC

## 2023-03-17 MED ORDER — TIZANIDINE HCL 2 MG PO TABS: 2.0000 mg | ORAL_TABLET | Freq: Three times a day (TID) | ORAL | 1 refills | Status: AC | PRN

## 2023-03-17 NOTE — Progress Notes (Signed)
 Orthopedic Spine Surgery Office Note   Assessment: Patient is a 78 y.o. female with low back pain that radiates into bilateral lateral thighs and legs. Has multiple levels with foraminal stenosis that could be causing radiculopathy   Plan: -Patient has tried PT, Tylenol, lyrica , tizanidine , narcotics with pain management -She feels that lyrica  and tizanidine  are helpful for her pain so new prescriptions were provided to her today. She asked if the lyrica  could be increase, but given her kidney function, I recommended against increasing it -I talked about injections as an additional treatment option for her back but she was not interested in that at this time -Patient should return to office on an as needed basis   ___________________________________________________________________________     History:   Patient is a 78 y.o. female who presents today for follow-up on her lumbar spine.  Patient states that she still having significant low back pain.  She has pain that radiates into bilateral lower extremities as well.  She feels a goes into the lateral aspect of her thighs and legs.  Her back pain is her most significant pain.  She feels this pain with activity and rest.  It is more severe than when she is doing household chores.  She has found the Lyrica  and tizanidine  mildly helpful in reducing her pain.  She has not developed any new symptoms since she was last seen.   Treatments tried: PT, Tylenol, lyrica , tizanidine , narcotics with pain management     Physical Exam:   General: no acute distress, appears stated age Neurologic: alert, answering questions appropriately, following commands Respiratory: unlabored breathing on room air, symmetric chest rise Psychiatric: appropriate affect, normal cadence to speech     MSK (spine):   -Strength exam                                                   Left                  Right   EHL                              5/5                   5/5 TA                                 5/5                  5/5 GSC                             5/5                  5/5 Knee extension            5/5                  5/5 Hip flexion                    5/5                  5/5   -Sensory exam  Sensation intact to light touch in L3-S1 nerve distributions of bilateral lower extremities   -Achilles DTR: 1/4 on the left, 1/4 on the right -Patellar tendon DTR: 1/4 on the left, 1/4 on the right   Left hip exam: No pain through range of motion Right hip exam: No pain through range of motion   Imaging: XRs of the lumbar spine from 01/05/2023 were previously independently reviewed and interpreted, showing disc height loss at L3/4, L4/5, L5/S1.  No evidence of instability on flexion/extension views.  No fracture or dislocation seen.   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  DDD at L3/4, L4/5, L5/S1.  Modic changes noted within the endplates at L3/4 and L4/5.  Fatty atrophy seen within the lower lumbar paraspinal muscles.     Patient name: Brandi Cooper Patient MRN: [id number] Date of visit: 03/17/23

## 2023-03-20 IMAGING — DX DG LUMBAR SPINE 2-3V
3 series · 3 of 3 positions shown · non-contrast
Comparison: None.

CLINICAL DATA: Chronic low back pain

EXAM:
LUMBAR SPINE - 2-3 VIEW

[l-spine ap]
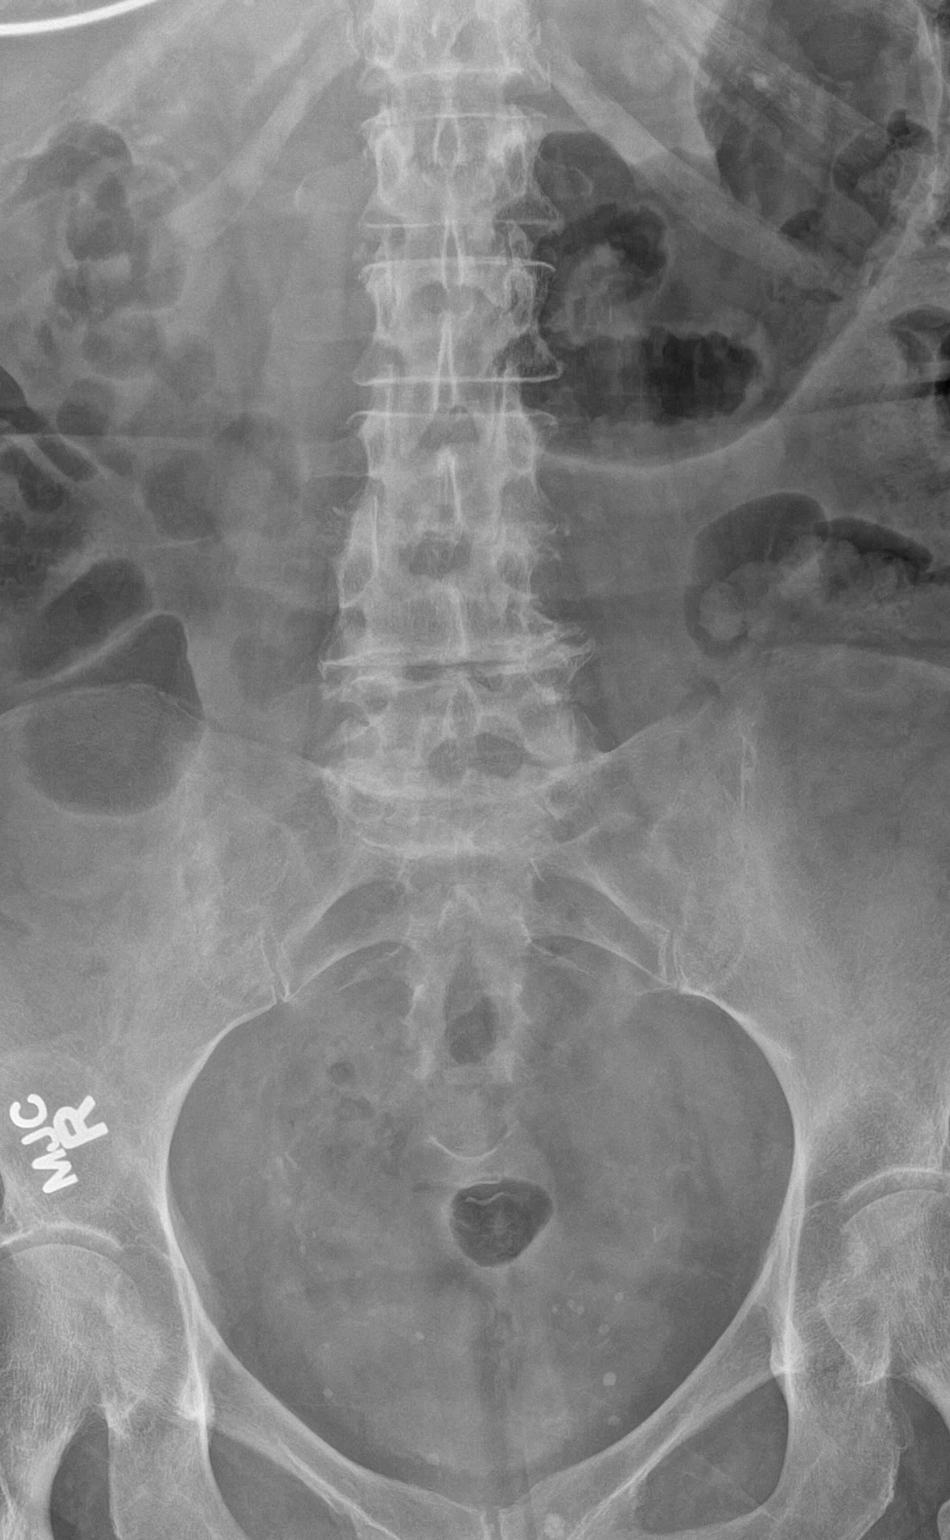

[l-spine lateral]
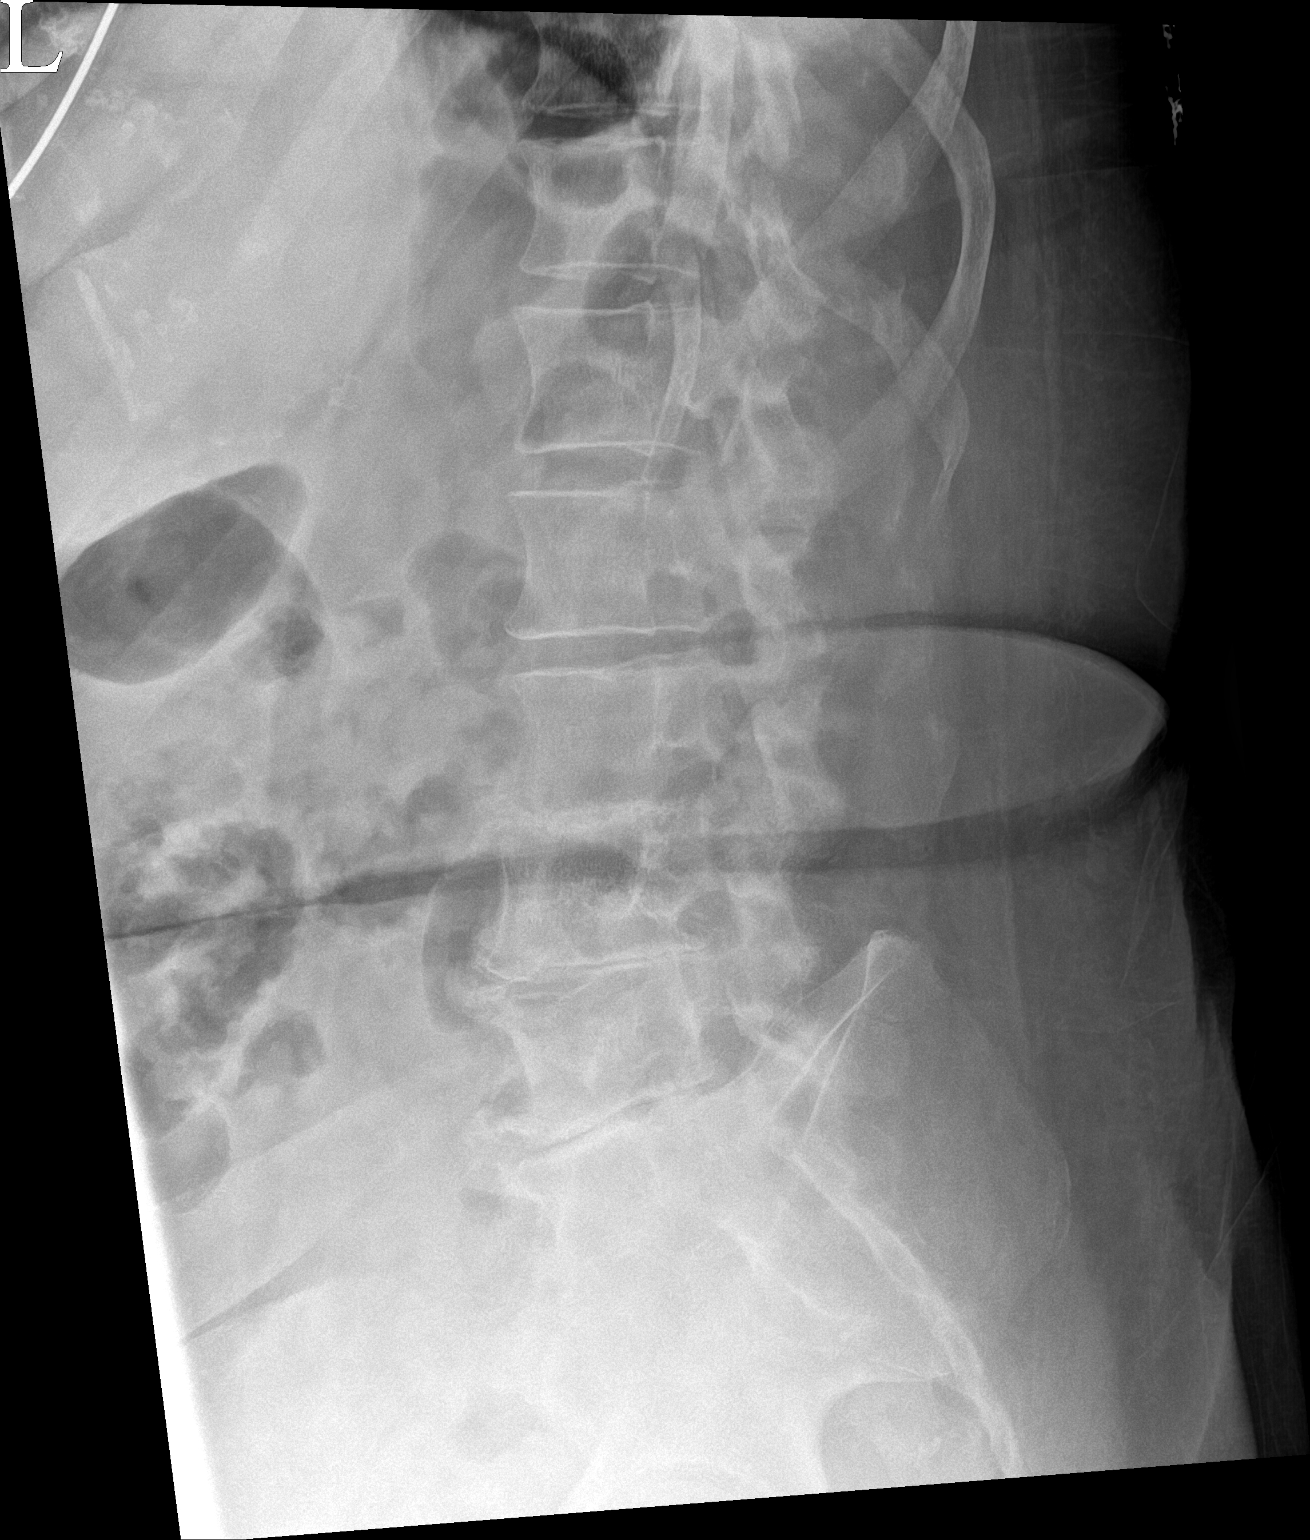

[l-spine spot]
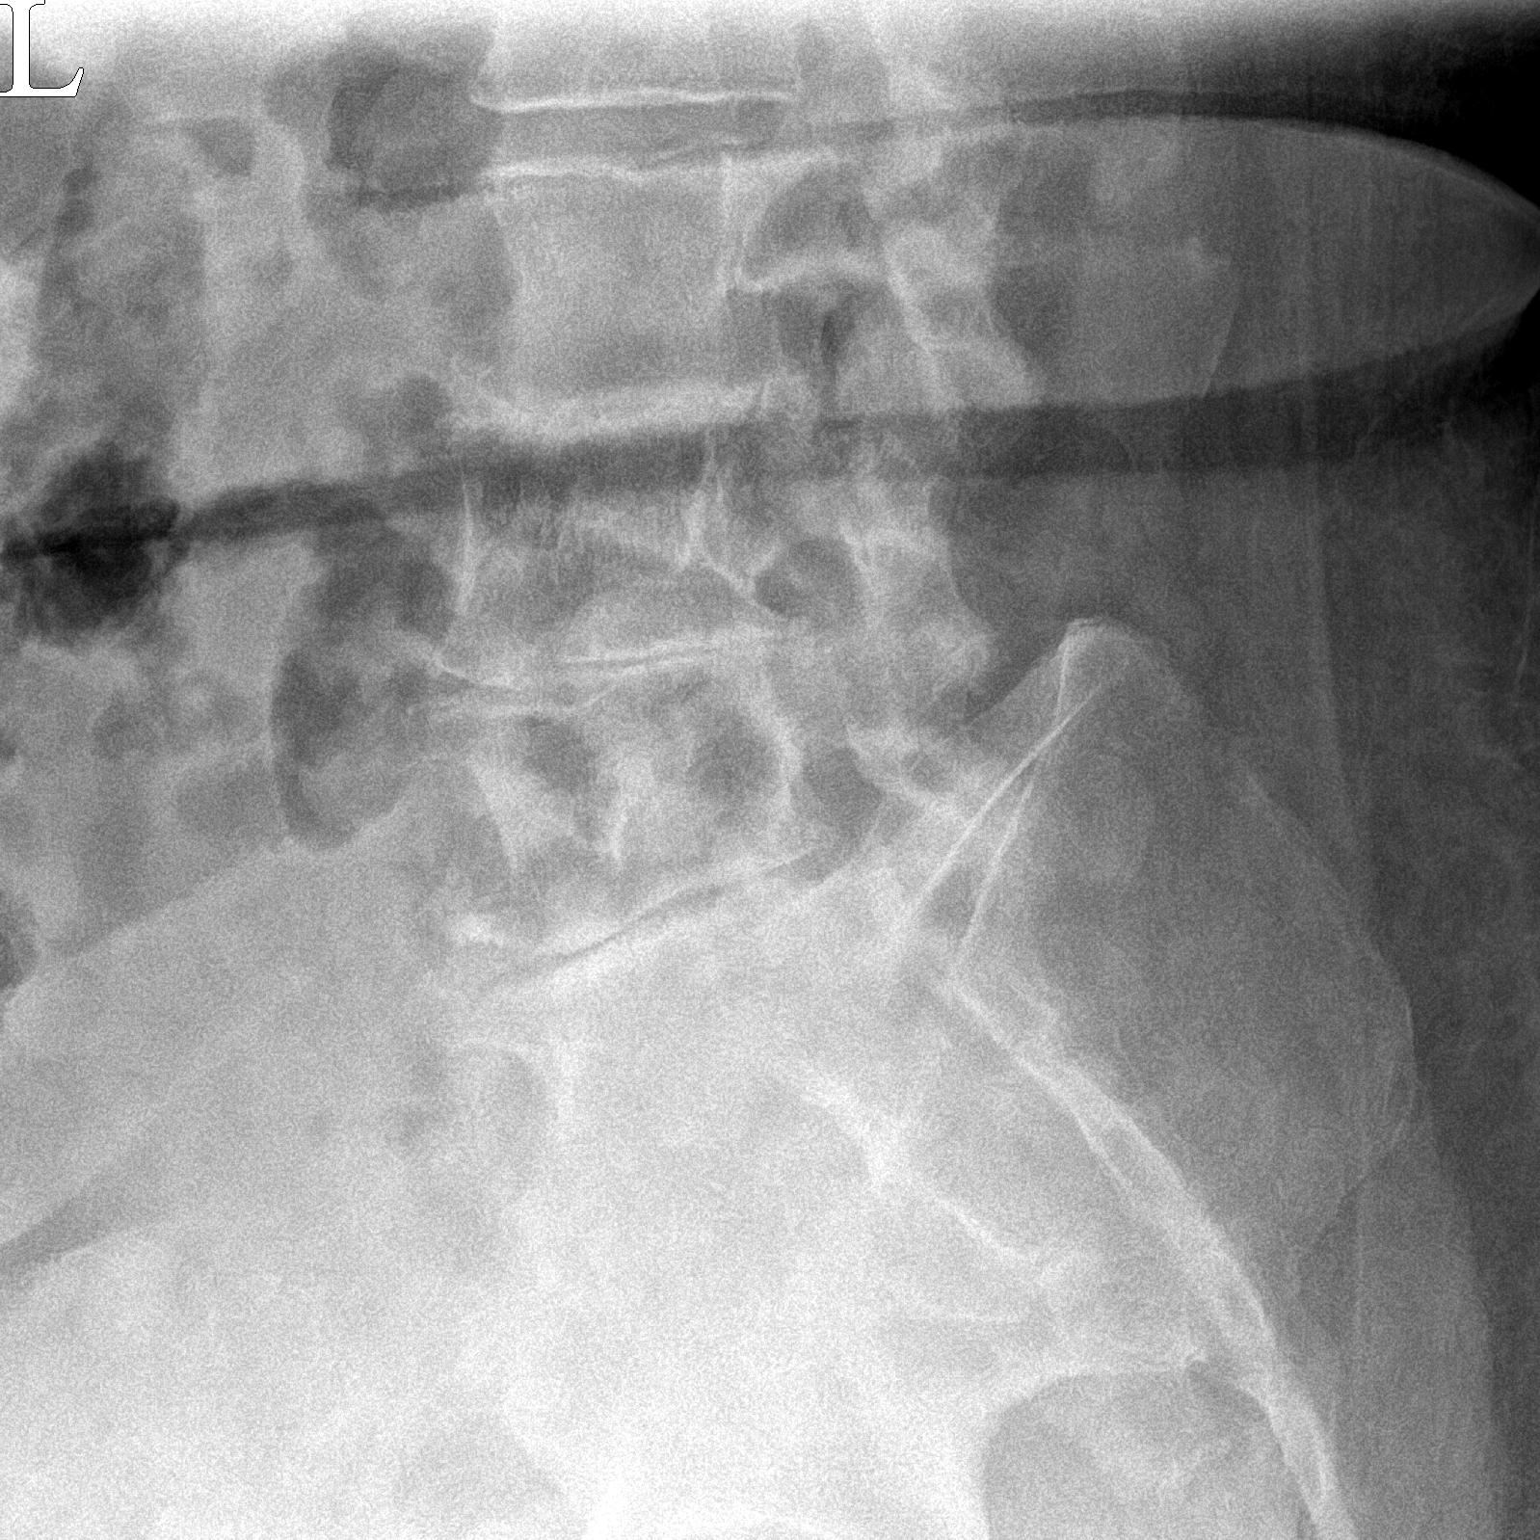

[3 of 3 positions shown; findings below may reference images not displayed]

FINDINGS: Five non rib-bearing lumbar type vertebra. Vertebral body heights
are maintained. Severe degenerative changes at L3-L4, L4-L5 and
L5-S1 with disc space narrowing, endplate changes and osteophytes.
IMPRESSION: Advanced degenerative changes L3 through S1.

## 2023-04-07 ENCOUNTER — Encounter (HOSPITAL_COMMUNITY): Payer: Medicare Other

## 2023-04-20 ENCOUNTER — Inpatient Hospital Stay (HOSPITAL_COMMUNITY): Admission: RE | Admit: 2023-04-20 | Payer: Medicare Other | Source: Ambulatory Visit

## 2023-04-27 ENCOUNTER — Encounter (HOSPITAL_COMMUNITY)
Admission: RE | Admit: 2023-04-27 | Discharge: 2023-04-27 | Disposition: A | Discharge status: Home / Self Care | Payer: Medicare Other | Source: Ambulatory Visit | Attending: Nephrology | Admitting: Nephrology

## 2023-04-27 VITALS — BP 129/58 | HR 67 | Temp 97.7°F | Resp 17

## 2023-04-27 DIAGNOSIS — N183 Chronic kidney disease, stage 3 unspecified: Secondary | ICD-10-CM | POA: Diagnosis present

## 2023-04-27 LAB — FERRITIN: Ferritin: 194 ng/mL (ref 11–307)

## 2023-04-27 LAB — POCT HEMOGLOBIN-HEMACUE: Hemoglobin: 10.2 g/dL — ABNORMAL LOW (ref 12.0–15.0)

## 2023-04-27 LAB — IRON AND TIBC
Iron: 54 ug/dL (ref 28–170)
Saturation Ratios: 16 % (ref 10.4–31.8)
TIBC: 333 ug/dL (ref 250–450)
UIBC: 279 ug/dL

## 2023-04-27 MED ORDER — EPOETIN ALFA-EPBX 10000 UNIT/ML IJ SOLN
INTRAMUSCULAR | Status: AC
  Filled 2023-04-27: qty 1

## 2023-04-27 MED ORDER — EPOETIN ALFA-EPBX 10000 UNIT/ML IJ SOLN
10000.0000 [IU] | INTRAMUSCULAR | Status: DC
  Administered 2023-04-27: 10000 [IU] via SUBCUTANEOUS

## 2023-05-04 ENCOUNTER — Encounter (HOSPITAL_COMMUNITY): Payer: Medicare Other

## 2023-05-11 ENCOUNTER — Encounter (HOSPITAL_COMMUNITY)
Admission: RE | Admit: 2023-05-11 | Discharge: 2023-05-11 | Disposition: A | Discharge status: Home / Self Care | Payer: Medicare Other | Source: Ambulatory Visit | Attending: Nephrology | Admitting: Nephrology

## 2023-05-11 VITALS — BP 104/54 | HR 59 | Temp 97.1°F | Resp 17

## 2023-05-11 DIAGNOSIS — N183 Chronic kidney disease, stage 3 unspecified: Secondary | ICD-10-CM | POA: Diagnosis not present

## 2023-05-11 MED ORDER — EPOETIN ALFA-EPBX 10000 UNIT/ML IJ SOLN
INTRAMUSCULAR | Status: AC
  Filled 2023-05-11: qty 1

## 2023-05-11 MED ORDER — EPOETIN ALFA-EPBX 10000 UNIT/ML IJ SOLN
10000.0000 [IU] | INTRAMUSCULAR | Status: DC
  Administered 2023-05-11: 10000 [IU] via SUBCUTANEOUS

## 2023-05-12 LAB — POCT HEMOGLOBIN-HEMACUE: Hemoglobin: 10.3 g/dL — ABNORMAL LOW (ref 12.0–15.0)

## 2023-05-25 ENCOUNTER — Encounter (HOSPITAL_COMMUNITY)
Admission: RE | Admit: 2023-05-25 | Discharge: 2023-05-25 | Disposition: A | Discharge status: Home / Self Care | Payer: Medicare Other | Source: Ambulatory Visit | Attending: Nephrology | Admitting: Nephrology

## 2023-05-25 VITALS — BP 84/63 | HR 62 | Temp 97.4°F | Resp 16

## 2023-05-25 DIAGNOSIS — N183 Chronic kidney disease, stage 3 unspecified: Secondary | ICD-10-CM | POA: Diagnosis present

## 2023-05-25 LAB — IRON AND TIBC
Iron: 73 ug/dL (ref 28–170)
Saturation Ratios: 22 % (ref 10.4–31.8)
TIBC: 333 ug/dL (ref 250–450)
UIBC: 260 ug/dL

## 2023-05-25 LAB — FERRITIN: Ferritin: 197 ng/mL (ref 11–307)

## 2023-05-25 LAB — POCT HEMOGLOBIN-HEMACUE: Hemoglobin: 10.5 g/dL — ABNORMAL LOW (ref 12.0–15.0)

## 2023-05-25 MED ORDER — EPOETIN ALFA-EPBX 10000 UNIT/ML IJ SOLN
INTRAMUSCULAR | Status: AC
  Filled 2023-05-25: qty 1

## 2023-05-25 MED ORDER — EPOETIN ALFA-EPBX 10000 UNIT/ML IJ SOLN
10000.0000 [IU] | INTRAMUSCULAR | Status: DC
  Administered 2023-05-25: 10000 [IU] via SUBCUTANEOUS

## 2023-05-30 ENCOUNTER — Other Ambulatory Visit: Payer: Self-pay | Admitting: Family Medicine

## 2023-06-02 ENCOUNTER — Ambulatory Visit: Payer: Medicare Other | Admitting: Family Medicine

## 2023-06-08 ENCOUNTER — Encounter (HOSPITAL_COMMUNITY)
Admission: RE | Admit: 2023-06-08 | Discharge: 2023-06-08 | Disposition: A | Discharge status: Home / Self Care | Source: Ambulatory Visit | Attending: Nephrology

## 2023-06-08 DIAGNOSIS — N183 Chronic kidney disease, stage 3 unspecified: Secondary | ICD-10-CM

## 2023-06-08 LAB — POCT HEMOGLOBIN-HEMACUE: Hemoglobin: 10.8 g/dL — ABNORMAL LOW (ref 12.0–15.0)

## 2023-06-08 MED ORDER — EPOETIN ALFA-EPBX 10000 UNIT/ML IJ SOLN
INTRAMUSCULAR | Status: AC
  Administered 2023-06-08: 10000 [IU] via SUBCUTANEOUS
  Filled 2023-06-08: qty 1

## 2023-06-08 MED ORDER — EPOETIN ALFA-EPBX 10000 UNIT/ML IJ SOLN: 10000.0000 [IU] | INTRAMUSCULAR | Status: DC

## 2023-06-15 ENCOUNTER — Encounter: Payer: Self-pay | Admitting: Family Medicine

## 2023-06-15 ENCOUNTER — Ambulatory Visit (INDEPENDENT_AMBULATORY_CARE_PROVIDER_SITE_OTHER): Admitting: Family Medicine

## 2023-06-15 VITALS — BP 90/57 | HR 59 | Temp 97.2°F | Ht 65.0 in | Wt 184.6 lb

## 2023-06-15 DIAGNOSIS — I152 Hypertension secondary to endocrine disorders: Secondary | ICD-10-CM

## 2023-06-15 DIAGNOSIS — E1122 Type 2 diabetes mellitus with diabetic chronic kidney disease: Secondary | ICD-10-CM | POA: Diagnosis not present

## 2023-06-15 DIAGNOSIS — N183 Chronic kidney disease, stage 3 unspecified: Secondary | ICD-10-CM | POA: Diagnosis not present

## 2023-06-15 DIAGNOSIS — R1013 Epigastric pain: Secondary | ICD-10-CM | POA: Diagnosis not present

## 2023-06-15 DIAGNOSIS — M51369 Other intervertebral disc degeneration, lumbar region without mention of lumbar back pain or lower extremity pain: Secondary | ICD-10-CM

## 2023-06-15 DIAGNOSIS — Z7984 Long term (current) use of oral hypoglycemic drugs: Secondary | ICD-10-CM

## 2023-06-15 DIAGNOSIS — E1159 Type 2 diabetes mellitus with other circulatory complications: Secondary | ICD-10-CM | POA: Diagnosis not present

## 2023-06-15 LAB — COMPREHENSIVE METABOLIC PANEL WITH GFR
ALT: 10 U/L (ref 0–35)
AST: 11 U/L (ref 0–37)
Albumin: 3.9 g/dL (ref 3.5–5.2)
Alkaline Phosphatase: 79 U/L (ref 39–117)
BUN: 62 mg/dL — ABNORMAL HIGH (ref 6–23)
CO2: 22 meq/L (ref 19–32)
Calcium: 8.3 mg/dL — ABNORMAL LOW (ref 8.4–10.5)
Chloride: 108 meq/L (ref 96–112)
Creatinine, Ser: 2.07 mg/dL — ABNORMAL HIGH (ref 0.40–1.20)
GFR: 22.81 mL/min — ABNORMAL LOW (ref >60.00)
Glucose, Bld: 161 mg/dL — ABNORMAL HIGH (ref 70–99)
Potassium: 3.9 meq/L (ref 3.5–5.1)
Sodium: 139 meq/L (ref 135–145)
Total Bilirubin: 0.5 mg/dL (ref 0.2–1.2)
Total Protein: 6.7 g/dL (ref 6.0–8.3)

## 2023-06-15 LAB — LIPID PANEL
Cholesterol: 122 mg/dL (ref 0–200)
HDL: 53.1 mg/dL (ref >39.00)
LDL Cholesterol: 45 mg/dL (ref 0–99)
NonHDL: 68.74
Total CHOL/HDL Ratio: 2
Triglycerides: 120 mg/dL (ref 0.0–149.0)
VLDL: 24 mg/dL (ref 0.0–40.0)

## 2023-06-15 LAB — CBC
HCT: 32.7 % — ABNORMAL LOW (ref 36.0–46.0)
Hemoglobin: 10.4 g/dL — ABNORMAL LOW (ref 12.0–15.0)
MCHC: 31.8 g/dL (ref 30.0–36.0)
MCV: 91.2 fl (ref 78.0–100.0)
Platelets: 170 10*3/uL (ref 150.0–400.0)
RBC: 3.58 Mil/uL — ABNORMAL LOW (ref 3.87–5.11)
RDW: 17.3 % — ABNORMAL HIGH (ref 11.5–15.5)
WBC: 6.1 10*3/uL (ref 4.0–10.5)

## 2023-06-15 LAB — LIPASE: Lipase: 69 U/L — ABNORMAL HIGH (ref 11.0–59.0)

## 2023-06-15 LAB — MICROALBUMIN / CREATININE URINE RATIO
Creatinine,U: 57.5 mg/dL
Microalb Creat Ratio: UNDETERMINED mg/g (ref 0.0–30.0)
Microalb, Ur: <0.7 mg/dL

## 2023-06-15 LAB — POCT GLYCOSYLATED HEMOGLOBIN (HGB A1C): Hemoglobin A1C: 6.5 % — AB (ref 4.0–5.6)

## 2023-06-15 LAB — TSH: TSH: 3.27 u[IU]/mL (ref 0.35–5.50)

## 2023-06-15 NOTE — Assessment & Plan Note (Signed)
 Continue management her nephrology. Avoiding nephrotoxic medications.

## 2023-06-15 NOTE — Assessment & Plan Note (Signed)
 A1c well controlled at 6.5. Continue januvia 25 mg daily and actos 25 mg daily. Recheck in 3-6 months.

## 2023-06-15 NOTE — Assessment & Plan Note (Signed)
 Blood pressure at goal on lisinopril 20 mg daily.

## 2023-06-15 NOTE — Patient Instructions (Addendum)
 It was very nice to see you today!  Your A1c today is 6.5.  We will continue her current medications.  We will check blood work and a CT scan for your abdominal pain.  Please let us know if you have any change in symptoms.   Return in about 6 months (around 12/15/2023) for Follow Up.   Take care, Dr [name]  PLEASE NOTE:  If you had any lab tests, please let us know if you have not heard back within a few days. You may see your results on mychart before we have a chance to review them but we will give you a call once they are reviewed by [place].   If we ordered any referrals today, please let us know if you have not heard from their office within the next week.   If you had any urgent prescriptions sent in today, please check with the pharmacy within an hour of our visit to make sure the prescription was transmitted appropriately.   Please try these tips to maintain a healthy lifestyle:  Eat at least 3 REAL meals and 1-2 snacks per day.  Aim for no more than 5 hours between eating.  If you eat breakfast, please do so within one hour of getting up.   Each meal should contain half fruits/vegetables, one quarter protein, and one quarter carbs (no bigger than a computer mouse)  Cut down on sweet beverages. This includes juice, soda, and sweet tea.   Drink at least 1 glass of water with each meal and aim for at least 8 glasses per day  Exercise at least 150 minutes every week.

## 2023-06-15 NOTE — Progress Notes (Signed)
   Brandi Cooper is a 78 y.o. female who presents today for an office visit.  Assessment/Plan:  New/Acute Problems: Abdominal Pain  Overall reassuring exam.  Will check labs including CBC, c-Met, lipase.  It is possible this may be GERD or PUD however given length of symptoms we will check CT scan to rule out other possible etiologies.  Also possible this may be related to degenerative changes in her back as well.  Depending on results of above workup may need referral to GI.  We discussed reasons to return to care and seek emergent care.  Chronic Problems Addressed Today: Type 2 diabetes mellitus with stage 3 chronic kidney disease, without long-term current use of insulin (HCC) A1c well controlled at 6.5. Continue januvia 25 mg daily and actos 25 mg daily. Recheck in 3-6 months.   CKD (chronic kidney disease) stage 3, GFR 30-59 ml/min (HCC) Continue management her nephrology. Avoiding nephrotoxic medications.   Hypertension associated with diabetes (HCC) Blood pressure at goal on lisinopril 20 mg daily.   Lumbar degenerative disc disease Following with neurosurgery and sports medicine. Still having quite a bit of issues with pain but is avoiding surgery for now.      Subjective:  HPI:  See Assessment / plan for status of chronic conditions.  Patient was last here about 6 months ago.  Over the last couple of months she is having occasional abdominal pain. Intermittent in nature. Worse with bending. She has not noticed anything else that makes it better or worse. No nausea or vomiting. Some intermittent constipation. No diarrhea. No blood in stool.  No chronic pain.  Not worse with eating.  Usually located in upper abdomen.  Can sometimes radiate into the chest as well.       Objective:  Physical Exam: BP (!) 90/57   Pulse (!) 59   Temp (!) 97.2 F (36.2 C) (Temporal)   Ht 5\' 5"  (1.651 m)   Wt 184 lb 9.6 oz (83.7 kg)   SpO2 98%   BMI 30.72 kg/m   Gen: No acute distress,  resting comfortably CV: Regular rate and rhythm with no murmurs appreciated Pulm: Normal work of breathing, clear to auscultation bilaterally with no crackles, wheezes, or rhonchi Abdomen: Bowel sounds present, soft, nontender, nondistended. Neuro: Grossly normal, moves all extremities Psych: Normal affect and thought content      Lucan Riner M. [name], MD 06/15/2023 9:22 AM

## 2023-06-15 NOTE — Assessment & Plan Note (Signed)
 Following with neurosurgery and sports medicine. Still having quite a bit of issues with pain but is avoiding surgery for now.

## 2023-06-16 NOTE — Progress Notes (Signed)
 Her labs are all stable.  Blood counts are stable.  Kidney function is stable.  Cholesterol levels are at goal.  We can recheck all these again in a year or so.  Her urine sample is normal.  We can also recheck this in a year.  No obvious explanation for her abdominal pain based on her labs.  We will contact her once we get results back on her CT scan.  She should let us know if she has any change in symptoms.

## 2023-06-21 ENCOUNTER — Other Ambulatory Visit: Payer: Self-pay | Admitting: Orthopedic Surgery

## 2023-06-22 ENCOUNTER — Encounter (HOSPITAL_COMMUNITY)
Admission: RE | Admit: 2023-06-22 | Discharge: 2023-06-22 | Disposition: A | Discharge status: Home / Self Care | Source: Ambulatory Visit | Attending: Nephrology | Admitting: Nephrology

## 2023-06-22 VITALS — BP 118/58 | HR 62 | Temp 97.6°F | Resp 16

## 2023-06-22 DIAGNOSIS — N183 Chronic kidney disease, stage 3 unspecified: Secondary | ICD-10-CM | POA: Diagnosis present

## 2023-06-22 LAB — POCT HEMOGLOBIN-HEMACUE: Hemoglobin: 10.3 g/dL — ABNORMAL LOW (ref 12.0–15.0)

## 2023-06-22 LAB — IRON AND TIBC
Iron: 74 ug/dL (ref 28–170)
Saturation Ratios: 23 % (ref 10.4–31.8)
TIBC: 326 ug/dL (ref 250–450)
UIBC: 252 ug/dL

## 2023-06-22 LAB — FERRITIN: Ferritin: 200 ng/mL (ref 11–307)

## 2023-06-22 MED ORDER — EPOETIN ALFA-EPBX 10000 UNIT/ML IJ SOLN
10000.0000 [IU] | INTRAMUSCULAR | Status: DC
  Administered 2023-06-22: 10000 [IU] via SUBCUTANEOUS

## 2023-06-22 MED ORDER — EPOETIN ALFA-EPBX 10000 UNIT/ML IJ SOLN
INTRAMUSCULAR | Status: AC
  Filled 2023-06-22: qty 1

## 2023-06-29 ENCOUNTER — Ambulatory Visit (HOSPITAL_BASED_OUTPATIENT_CLINIC_OR_DEPARTMENT_OTHER): Admission: RE | Admit: 2023-06-29 | Source: Ambulatory Visit

## 2023-06-29 ENCOUNTER — Other Ambulatory Visit: Payer: Self-pay | Admitting: *Deleted

## 2023-06-29 ENCOUNTER — Telehealth: Payer: Self-pay | Admitting: *Deleted

## 2023-06-29 ENCOUNTER — Ambulatory Visit (HOSPITAL_BASED_OUTPATIENT_CLINIC_OR_DEPARTMENT_OTHER)
Admission: RE | Admit: 2023-06-29 | Discharge: 2023-06-29 | Disposition: A | Discharge status: Home / Self Care | Source: Ambulatory Visit | Attending: Family Medicine | Admitting: Family Medicine

## 2023-06-29 DIAGNOSIS — R1013 Epigastric pain: Secondary | ICD-10-CM

## 2023-06-29 NOTE — Telephone Encounter (Signed)
 Copied from CRM [id number]. Topic: General - Other >> Jun 29, 2023  7:56 AM Ovid Blow wrote: Reason for CRM: Mara from [facility] stated Patient GFO is too low and need to see if we can change order to Without contrast or see if we can change the appt.. Appt is 12pm today Please call [phone] for more info   Ok to change order? Kathreen Dileo,RMA

## 2023-06-29 NOTE — Telephone Encounter (Signed)
 New order placed

## 2023-07-06 ENCOUNTER — Encounter (HOSPITAL_COMMUNITY)
Admission: RE | Admit: 2023-07-06 | Discharge: 2023-07-06 | Disposition: A | Discharge status: Home / Self Care | Source: Ambulatory Visit | Attending: Nephrology | Admitting: Nephrology

## 2023-07-06 VITALS — BP 92/48 | HR 64 | Temp 97.2°F

## 2023-07-06 DIAGNOSIS — N183 Chronic kidney disease, stage 3 unspecified: Secondary | ICD-10-CM | POA: Diagnosis not present

## 2023-07-06 LAB — POCT HEMOGLOBIN-HEMACUE: Hemoglobin: 10.1 g/dL — ABNORMAL LOW (ref 12.0–15.0)

## 2023-07-06 MED ORDER — EPOETIN ALFA 10000 UNIT/ML IJ SOLN
INTRAMUSCULAR | Status: AC
  Administered 2023-07-06: 10000 [IU] via SUBCUTANEOUS
  Filled 2023-07-06: qty 1

## 2023-07-06 MED ORDER — EPOETIN ALFA 10000 UNIT/ML IJ SOLN: 10000.0000 [IU] | Freq: Once | INTRAMUSCULAR | Status: AC

## 2023-07-14 NOTE — Progress Notes (Signed)
 CT scan was normal.  No significant abnormalities.  Recommend referral to GI if she still having abdominal pain.

## 2023-07-18 ENCOUNTER — Other Ambulatory Visit: Payer: Self-pay | Admitting: *Deleted

## 2023-07-18 DIAGNOSIS — R1013 Epigastric pain: Secondary | ICD-10-CM

## 2023-07-20 ENCOUNTER — Encounter (HOSPITAL_COMMUNITY)
Admission: RE | Admit: 2023-07-20 | Discharge: 2023-07-20 | Disposition: A | Discharge status: Home / Self Care | Source: Ambulatory Visit | Attending: Nephrology | Admitting: Nephrology

## 2023-07-20 VITALS — BP 94/57 | HR 78 | Temp 97.9°F | Resp 17

## 2023-07-20 DIAGNOSIS — N183 Chronic kidney disease, stage 3 unspecified: Secondary | ICD-10-CM | POA: Diagnosis present

## 2023-07-20 LAB — IRON AND TIBC
Iron: 51 ug/dL (ref 28–170)
Saturation Ratios: 16 % (ref 10.4–31.8)
TIBC: 325 ug/dL (ref 250–450)
UIBC: 274 ug/dL

## 2023-07-20 LAB — POCT HEMOGLOBIN-HEMACUE: Hemoglobin: 10.3 g/dL — ABNORMAL LOW (ref 12.0–15.0)

## 2023-07-20 LAB — FERRITIN: Ferritin: 171 ng/mL (ref 11–307)

## 2023-07-20 MED ORDER — EPOETIN ALFA-EPBX 10000 UNIT/ML IJ SOLN
INTRAMUSCULAR | Status: AC
Start: 2023-07-20 — End: 2023-07-20
  Filled 2023-07-20: qty 1

## 2023-07-20 MED ORDER — EPOETIN ALFA-EPBX 10000 UNIT/ML IJ SOLN
10000.0000 [IU] | Freq: Once | INTRAMUSCULAR | Status: AC
  Administered 2023-07-20: 10000 [IU] via SUBCUTANEOUS

## 2023-07-24 ENCOUNTER — Other Ambulatory Visit: Payer: Self-pay | Admitting: Orthopedic Surgery

## 2023-07-25 LAB — BASIC METABOLIC PANEL WITH GFR
BUN: 44 — AB (ref 4–21)
CO2: 20 (ref 13–22)
Chloride: 111 — AB (ref 99–108)
Creatinine: 2.1 — AB (ref 0.5–1.1)
Glucose: 134
Potassium: 4.1 meq/L (ref 3.5–5.1)
Sodium: 145 (ref 137–147)

## 2023-07-25 LAB — COMPREHENSIVE METABOLIC PANEL WITH GFR: Albumin: 4 (ref 3.5–5.0)

## 2023-07-27 LAB — LAB REPORT - SCANNED: EGFR: 25

## 2023-08-02 ENCOUNTER — Encounter: Payer: Self-pay | Admitting: Physician Assistant

## 2023-08-03 ENCOUNTER — Encounter (HOSPITAL_COMMUNITY)
Admission: RE | Admit: 2023-08-03 | Discharge: 2023-08-03 | Disposition: A | Discharge status: Home / Self Care | Source: Ambulatory Visit | Attending: Nephrology | Admitting: Nephrology

## 2023-08-03 VITALS — BP 100/60 | HR 72 | Temp 97.3°F | Resp 17

## 2023-08-03 DIAGNOSIS — N183 Chronic kidney disease, stage 3 unspecified: Secondary | ICD-10-CM | POA: Diagnosis not present

## 2023-08-03 LAB — POCT HEMOGLOBIN-HEMACUE: Hemoglobin: 10.6 g/dL — ABNORMAL LOW (ref 12.0–15.0)

## 2023-08-03 MED ORDER — EPOETIN ALFA-EPBX 10000 UNIT/ML IJ SOLN
10000.0000 [IU] | Freq: Once | INTRAMUSCULAR | Status: AC
  Administered 2023-08-03: 10000 [IU] via SUBCUTANEOUS

## 2023-08-03 MED ORDER — EPOETIN ALFA-EPBX 10000 UNIT/ML IJ SOLN
INTRAMUSCULAR | Status: AC
  Filled 2023-08-03: qty 1

## 2023-08-04 ENCOUNTER — Encounter: Payer: Self-pay | Admitting: Family Medicine

## 2023-08-10 ENCOUNTER — Other Ambulatory Visit: Payer: Self-pay | Admitting: *Deleted

## 2023-08-10 MED ORDER — TORSEMIDE 20 MG PO TABS: 20.0000 mg | ORAL_TABLET | Freq: Two times a day (BID) | ORAL | 3 refills | Status: AC

## 2023-08-15 ENCOUNTER — Other Ambulatory Visit: Payer: Self-pay | Admitting: Family Medicine

## 2023-08-15 NOTE — Telephone Encounter (Signed)
 Copied from CRM [id number]. Topic: Clinical - Medication Refill >> Aug 15, 2023  3:15 PM Lizabeth Riggs wrote: Medication: lisinopril  (ZESTRIL ) 20 MG tablet  Has the patient contacted their pharmacy? Yes (Agent: If no, request that the patient contact the pharmacy for the refill. If patient does not wish to contact the pharmacy document the reason why and proceed with request.) (Agent: If yes, when and what did the pharmacy advise?) Pharmacy needs order to refill  This is the patient's preferred pharmacy:  [id number] Jonette Nestle, [place] - [address] AT [facility] OF [facility] & [address] [place] [address] Phone: [phone] Fax: [phone]  Is this the correct pharmacy for this prescription? Yes If no, delete pharmacy and type the correct one.   Has the prescription been filled recently? No  Is the patient out of the medication? Yes - She ran out of medication yesterday.   Has the patient been seen for an appointment in the last year OR does the patient have an upcoming appointment? Yes  Can we respond through MyChart? No  Agent: Please be advised that Rx refills may take up to 3 business days. We ask that you follow-up with your pharmacy.

## 2023-08-16 ENCOUNTER — Other Ambulatory Visit: Payer: Self-pay | Admitting: *Deleted

## 2023-08-16 MED ORDER — PIOGLITAZONE HCL 45 MG PO TABS: ORAL_TABLET | ORAL | 1 refills | Status: DC

## 2023-08-16 MED ORDER — LISINOPRIL 20 MG PO TABS: ORAL_TABLET | ORAL | 1 refills | Status: DC

## 2023-08-17 ENCOUNTER — Encounter (HOSPITAL_COMMUNITY)

## 2023-08-24 ENCOUNTER — Encounter (HOSPITAL_COMMUNITY)
Admission: RE | Admit: 2023-08-24 | Discharge: 2023-08-24 | Disposition: A | Discharge status: Home / Self Care | Source: Ambulatory Visit | Attending: Nephrology | Admitting: Nephrology

## 2023-08-24 VITALS — BP 104/53 | HR 53 | Resp 16

## 2023-08-24 DIAGNOSIS — N183 Chronic kidney disease, stage 3 unspecified: Secondary | ICD-10-CM | POA: Insufficient documentation

## 2023-08-24 LAB — IRON AND TIBC
Iron: 45 ug/dL (ref 28–170)
Saturation Ratios: 13 % (ref 10.4–31.8)
TIBC: 336 ug/dL (ref 250–450)
UIBC: 291 ug/dL

## 2023-08-24 LAB — FERRITIN: Ferritin: 122 ng/mL (ref 11–307)

## 2023-08-24 MED ORDER — EPOETIN ALFA-EPBX 10000 UNIT/ML IJ SOLN
INTRAMUSCULAR | Status: AC
  Filled 2023-08-24: qty 1

## 2023-08-24 MED ORDER — EPOETIN ALFA-EPBX 10000 UNIT/ML IJ SOLN
10000.0000 [IU] | Freq: Once | INTRAMUSCULAR | Status: AC
  Administered 2023-08-24: 10000 [IU] via SUBCUTANEOUS

## 2023-08-25 LAB — POCT HEMOGLOBIN-HEMACUE: Hemoglobin: 10.1 g/dL — ABNORMAL LOW (ref 12.0–15.0)

## 2023-08-31 ENCOUNTER — Encounter (HOSPITAL_COMMUNITY)

## 2023-09-07 ENCOUNTER — Encounter (HOSPITAL_COMMUNITY)
Admission: RE | Admit: 2023-09-07 | Discharge: 2023-09-07 | Disposition: A | Discharge status: Home / Self Care | Source: Ambulatory Visit | Attending: Nephrology | Admitting: Nephrology

## 2023-09-07 VITALS — BP 102/51 | HR 89 | Temp 97.6°F | Resp 16

## 2023-09-07 DIAGNOSIS — N183 Chronic kidney disease, stage 3 unspecified: Secondary | ICD-10-CM

## 2023-09-07 LAB — POCT HEMOGLOBIN-HEMACUE: Hemoglobin: 11 g/dL — ABNORMAL LOW (ref 12.0–15.0)

## 2023-09-07 MED ORDER — EPOETIN ALFA-EPBX 10000 UNIT/ML IJ SOLN
INTRAMUSCULAR | Status: AC
  Filled 2023-09-07: qty 1

## 2023-09-07 MED ORDER — EPOETIN ALFA-EPBX 10000 UNIT/ML IJ SOLN
10000.0000 [IU] | Freq: Once | INTRAMUSCULAR | Status: AC
  Administered 2023-09-07: 10000 [IU] via SUBCUTANEOUS

## 2023-09-21 ENCOUNTER — Encounter (HOSPITAL_COMMUNITY)
Admission: RE | Admit: 2023-09-21 | Discharge: 2023-09-21 | Disposition: A | Discharge status: Home / Self Care | Source: Ambulatory Visit | Attending: Nephrology | Admitting: Nephrology

## 2023-09-21 VITALS — BP 106/56 | HR 59 | Temp 97.8°F | Resp 16

## 2023-09-21 DIAGNOSIS — N183 Chronic kidney disease, stage 3 unspecified: Secondary | ICD-10-CM | POA: Diagnosis present

## 2023-09-21 LAB — IRON AND TIBC
Iron: 61 ug/dL (ref 28–170)
Saturation Ratios: 17 % (ref 10.4–31.8)
TIBC: 353 ug/dL (ref 250–450)
UIBC: 292 ug/dL

## 2023-09-21 LAB — POCT HEMOGLOBIN-HEMACUE: Hemoglobin: 11.1 g/dL — ABNORMAL LOW (ref 12.0–15.0)

## 2023-09-21 LAB — FERRITIN: Ferritin: 140 ng/mL (ref 11–307)

## 2023-09-21 MED ORDER — EPOETIN ALFA-EPBX 10000 UNIT/ML IJ SOLN
10000.0000 [IU] | Freq: Once | INTRAMUSCULAR | Status: AC
  Administered 2023-09-21: 10000 [IU] via SUBCUTANEOUS

## 2023-09-21 MED ORDER — EPOETIN ALFA-EPBX 10000 UNIT/ML IJ SOLN
INTRAMUSCULAR | Status: AC
  Filled 2023-09-21: qty 1

## 2023-09-26 ENCOUNTER — Encounter: Payer: Self-pay | Admitting: Physician Assistant

## 2023-09-26 ENCOUNTER — Ambulatory Visit: Admitting: Physician Assistant

## 2023-09-26 VITALS — BP 94/58 | HR 64 | Ht 65.0 in | Wt 182.1 lb

## 2023-09-26 DIAGNOSIS — M51369 Other intervertebral disc degeneration, lumbar region without mention of lumbar back pain or lower extremity pain: Secondary | ICD-10-CM | POA: Diagnosis not present

## 2023-09-26 DIAGNOSIS — R1030 Lower abdominal pain, unspecified: Secondary | ICD-10-CM | POA: Diagnosis not present

## 2023-09-26 MED ORDER — DICYCLOMINE HCL 10 MG PO CAPS: 10.0000 mg | ORAL_CAPSULE | Freq: Two times a day (BID) | ORAL | 3 refills | Status: DC

## 2023-09-26 NOTE — Patient Instructions (Signed)
 We have sent the following medications to your pharmacy for you to pick up at your convenience: Dicyclomine  10 mg twice daily as needed.  _______________________________________________________  If your blood pressure at your visit was 140/90 or greater, please contact your primary care physician to follow up on this.  _______________________________________________________  If you are age 77 or older, your body mass index should be between 23-30. Your Body mass index is 30.31 kg/m. If this is out of the aforementioned range listed, please consider follow up with your Primary Care Provider.  If you are age 39 or younger, your body mass index should be between 19-25. Your Body mass index is 30.31 kg/m. If this is out of the aformentioned range listed, please consider follow up with your Primary Care Provider.   ________________________________________________________  The [facility] providers would like to encourage you to use MYCHART to communicate with providers for non-urgent requests or questions.  Due to long hold times on the telephone, sending your provider a message by [facility] may be a faster and more efficient way to get a response.  Please allow 48 business hours for a response.  Please remember that this is for non-urgent requests.  _______________________________________________________

## 2023-09-26 NOTE — Progress Notes (Signed)
 Chief Complaint: Lower Abdominal Pain  HPI:    Brandi Cooper is a 78 year old African-American female with a past medical history as listed below including CKD and diabetes, known to Dr. Legrand, who was referred to me by Kennyth Worth HERO, MD for a complaint of lower abdominal pain.      12/05/2015 colonoscopy done for heme positive stool diverticulosis in the sigmoid colon nonbleeding internal hemorrhoids.    12/05/2015 EGD in [place]  with 3 cm hiatal hernia and nonbleeding superficial gastric ulcers with clean bases.  Largest lesion 5 mm.  Patient told to use Omeprazole 40 mg daily.  Repeat EGD in 10 to 12 weeks.    06/01/2018 office visit with Dr. 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  Discussed the possible history of ulcers.  She was describing dyspepsia years in the past and was started on Omeprazole.  At that point patient was told to wean off of Omeprazole as there is no clear records for exact indication.  Reported last colonoscopy 2014.  That point she was put into a 25-month recall for repeat colonoscopy given a possible family history of colon cancer in her son.    06/10/2022 laryngoscopy normal.    01/24/2023 MRI of the lumbar spine without contrast showed diffuse lumbar spine spondylosis with mild disc bulging.   4 /16/25 CTAP without contrast showed no acute findings in the abdomen or pelvis.    09/21/2023 hemoglobin checked and 11.1 (10.3 on/9/25).  Iron studies normal.  Ferritin normal.    Today, patient describes that for the past 6 months she has had pain in the lower part of the center of her abdomen when she tries to stand up she feels like her stomach is twisting in knots.  Describes this is more of a cramp that seizes and will last for a few seconds and then passes and then she is able to move again.  Describes that she had some similar pain back in the 90s when she had a fibroid removed, but had recent CT as above with no acute findings.  Tells me that she does  have a lot of back troubles because she has erosions of the bone in my back, but they do not want to do surgery for her.  She does ambulate with a cane.  Again the only time she feels this discomfort and spasming is when she is going to get up.  Maybe very occasionally it will linger after she sits back down.  Describes normal bowel movements with no change and no bleeding.  No nausea or vomiting.    Denies fever, chills or weight loss.  Past Medical History:  Diagnosis Date   Chronic kidney disease    Diabetes mellitus without complication (HCC)    GERD (gastroesophageal reflux disease)    Glaucoma    Heart disease     Past Surgical History:  Procedure Laterality Date   ABDOMINAL HYSTERECTOMY     blood transfusion     BREAST SURGERY     Breast Reduction   CERVICAL FUSION     EYE SURGERY     KNEE CARTILAGE SURGERY     LUMBAR DISC SURGERY     REDUCTION MAMMAPLASTY     RETINOPATHY SURGERY     SHOULDER SURGERY Bilateral    Rotator Cuff   TRABECULECTOMY     TUBAL LIGATION     VITRECTOMY AND CATARACT     WRIST SURGERY     Carpal Tunnel  Current Outpatient Medications  Medication Sig Dispense Refill   acetaZOLAMIDE (DIAMOX) 250 MG tablet      Ascorbic Acid (VITAMIN C) 1000 MG tablet Take 1,000 mg by mouth daily.     aspirin 81 MG tablet Take 81 mg by mouth daily.     atorvastatin  (LIPITOR) 10 MG tablet TAKE 1 TABLET(10 MG) BY MOUTH DAILY 90 tablet 3   Biotin 5000 MCG CAPS Take by mouth.     brimonidine (ALPHAGAN) 0.2 % ophthalmic solution 3 (three) times daily.     Cholecalciferol (VITAMIN D3) 50 MCG (2000 UT) capsule Take by mouth.     dorzolamide-timolol (COSOPT) 22.3-6.8 MG/ML ophthalmic solution 1 drop 2 (two) times daily.     GNP CINNAMON PO Take by mouth. Takes 2000mg  2 capfuls daily     JANUVIA  25 MG tablet TAKE 1 TABLET(25 MG) BY MOUTH DAILY 30 tablet 5   lisinopril  (ZESTRIL ) 20 MG tablet TAKE 1 TABLET(20 MG) BY MOUTH DAILY 90 tablet 1   Omega-3 Fatty Acids (FISH  OIL) 1200 MG CAPS Take 2 capsules by mouth.     pioglitazone  (ACTOS ) 45 MG tablet TAKE 1 TABLET(45 MG) BY MOUTH DAILY 90 tablet 1   pregabalin  (LYRICA ) 25 MG capsule TAKE 1 CAPSULE(25 MG) BY MOUTH TWICE DAILY 60 capsule 2   ROCKLATAN 0.02-0.005 % SOLN Apply 1 drop to eye at bedtime.     sodium bicarbonate 650 MG tablet Take 1,300 mg by mouth 2 (two) times daily.     tiZANidine  (ZANAFLEX ) 2 MG tablet Take 1-2 tablets (2-4 mg total) by mouth every 8 (eight) hours as needed (spasms, pain). 60 tablet 1   torsemide  (DEMADEX ) 20 MG tablet Take 1 tablet (20 mg total) by mouth 2 (two) times daily. 180 tablet 3   No current facility-administered medications for this visit.    Allergies as of 09/26/2023 - Review Complete 09/26/2023  Allergen Reaction Noted   Amitriptyline  03/30/2018   Nsaids  06/01/2018    Family History  Problem Relation Age of Onset   Diabetes Mother    Hypertension Mother    Stroke Mother    Diabetes Father    Arthritis Sister    Cancer Sister    Heart attack Sister    Cancer Brother    Hypertension Daughter    Cancer Son    Stroke Maternal Grandmother    Hypertension Maternal Grandmother    Diabetes Paternal Grandmother    Heart attack Paternal Grandfather    Cancer Sister    Cancer Sister    Stroke Sister    Hypertension Sister    Diabetes Brother    Kidney disease Brother     Social History   Socioeconomic History   Marital status: Married    Spouse name: Not on file   Number of children: 4   Years of education: Not on file   Highest education level: Not on file  Occupational History   Occupation: retired  Tobacco Use   Smoking status: Never   Smokeless tobacco: Never  Vaping Use   Vaping status: Never Used  Substance and Sexual Activity   Alcohol use: Never   Drug use: Never   Sexual activity: Not on file  Other Topics Concern   Not on file  Social History Narrative   Not on file   Social Drivers of Health   Financial Resource Strain:  Low Risk  (01/20/2023)   Overall Financial Resource Strain (CARDIA)    Difficulty of Paying Living Expenses:  Not hard at all  Food Insecurity: No Food Insecurity (01/20/2023)   Hunger Vital Sign    Worried About Running Out of Food in the Last Year: Never true    Ran Out of Food in the Last Year: Never true  Transportation Needs: No Transportation Needs (01/20/2023)   PRAPARE - Administrator, Civil Service (Medical): No    Lack of Transportation (Non-Medical): No  Physical Activity: Inactive (01/20/2023)   Exercise Vital Sign    Days of Exercise per Week: 0 days    Minutes of Exercise per Session: 0 min  Stress: No Stress Concern Present (01/20/2023)   [employer] of Occupational Health - Occupational Stress Questionnaire    Feeling of Stress : Not at all  Social Connections: Moderately Integrated (01/20/2023)   Social Connection and Isolation Panel    Frequency of Communication with Friends and Family: More than three times a week    Frequency of Social Gatherings with Friends and Family: Three times a week    Attends Religious Services: 1 to 4 times per year    Active Member of Clubs or Organizations: No    Attends Banker Meetings: Never    Marital Status: Married  Catering manager Violence: Not At Risk (01/20/2023)   Humiliation, Afraid, Rape, and Kick questionnaire    Fear of Current or Ex-Partner: No    Emotionally Abused: No    Physically Abused: No    Sexually Abused: No    Review of Systems:    Constitutional: No weight loss, fever or chills Skin: No rash or itching Cardiovascular: No chest pain Respiratory: No SOB  Gastrointestinal: See HPI and otherwise negative Genitourinary: No dysuria or change in urinary frequency Neurological: No headache, dizziness or syncope Musculoskeletal: See HPI Hematologic: No bleeding or bruising Psychiatric: No history of depression or anxiety   Physical Exam:  Vital signs: BP (!) 94/58 (BP Location:  Right Arm, Patient Position: Sitting, Cuff Size: Large)   Pulse 64   Ht 5' 5 (1.651 m)   Wt 182 lb 2 oz (82.6 kg)   BMI 30.31 kg/m    Constitutional:   Pleasant elderly AA female appears to be in NAD, Well developed, Well nourished, alert and cooperative Head:  Normocephalic and atraumatic. Eyes:   PEERL, EOMI. No icterus. Conjunctiva pink. Ears:  Normal auditory acuity. Neck:  Supple Throat: Oral cavity and pharynx without inflammation, swelling or lesion.  Respiratory: Respirations even and unlabored. Lungs clear to auscultation bilaterally.   No wheezes, crackles, or rhonchi.  Cardiovascular: Normal S1, S2. No MRG. Regular rate and rhythm. No peripheral edema, cyanosis or pallor.  Gastrointestinal:  Soft, nondistended, nontender. No rebound or guarding. Normal bowel sounds. No appreciable masses or hepatomegaly. Rectal:  Not performed.  Msk:  Symmetrical without gross deformities. Without edema, no deformity or joint abnormality. +ambulates with cane Psychiatric: Demonstrates good judgement and reason without abnormal affect or behaviors.  RELEVANT LABS AND IMAGING: CBC    Component Value Date/Time   WBC 6.1 06/15/2023 1019   RBC 3.58 (L) 06/15/2023 1019   HGB 11.1 (L) 09/21/2023 0923   HCT 32.7 (L) 06/15/2023 1019   PLT 170.0 06/15/2023 1019   MCV 91.2 06/15/2023 1019   MCH 29.3 03/05/2022 1236   MCHC 31.8 06/15/2023 1019   RDW 17.3 (H) 06/15/2023 1019   LYMPHSABS 0.6 (L) 03/01/2022 0839   MONOABS 0.2 03/01/2022 0839   EOSABS 0.0 03/01/2022 0839   BASOSABS 0.0 03/01/2022 9160  CMP     Component Value Date/Time   NA 145 07/25/2023 0000   K 4.1 07/25/2023 0000   CL 111 (A) 07/25/2023 0000   CO2 20 07/25/2023 0000   GLUCOSE 161 (H) 06/15/2023 1019   BUN 44 (A) 07/25/2023 0000   CREATININE 2.1 (A) 07/25/2023 0000   CREATININE 2.07 (H) 06/15/2023 1019   CREATININE 1.78 (H) 03/05/2022 1236   CALCIUM  8.3 (L) 06/15/2023 1019   CALCIUM  8.9 03/04/2021 0925   PROT 6.7  06/15/2023 1019   ALBUMIN 4.0 07/25/2023 0000   AST 11 06/15/2023 1019   ALT 10 06/15/2023 1019   ALKPHOS 79 06/15/2023 1019   BILITOT 0.5 06/15/2023 1019   GFRNONAA 32 (L) 03/01/2022 0839   GFRAA 32 10/03/2020 0000    Assessment: 1.  Lower abdominal pain/spasm: Patient describes spasming of the lower abdomen when she goes from a sitting to standing position that can last for 15 to 20 seconds and then releases, no change in bowel habits, no nausea or vomiting, known diffuse lumbar spine spondylosis with disc bulging and facet arthropathy; most likely musculoskeletal 2.  Vague history of colon cancer: In her son, see Dr. Clayburn note from last visit, at the time he had put her in recall for a colonoscopy, she is now 55, we received records from [place]  since then with her last normal colonoscopy in 2017 no polyps  Plan: 1.  Discussed the patient and I think her symptoms are most likely musculoskeletal and related to her back.  She is on no medications for this which would hopefully be helpful.  I recommend that she go back to see her orthopedic doctor and discuss this further.  Also discussed possible referral back to her sports medicine physician here for further discussion. 2.  I am going to give her a small amount of Dicyclomine  10 mg which she can take up to twice daily.  Discussed possible side effects of this including dizziness, I do not want her to take this more often than needed.  She can try it though the next time she has a spasm to see if it is helpful at all.  Again I think this is more likely related to her back. 3.  Will confirm with Dr. Legrand, but at this point with the patient's age and degenerating back the risk of a colonoscopy may be higher than the reward especially given only vague history of colon cancer in her son and a normal colonoscopy in 2017 4.  Patient to follow in clinic with us  in 2 to 3 months or sooner if needed.  Delon Failing, PA-C   Gastroenterology 09/26/2023, 11:14 AM  Cc: Kennyth Worth HERO, MD

## 2023-10-05 ENCOUNTER — Encounter (HOSPITAL_COMMUNITY)
Admission: RE | Admit: 2023-10-05 | Discharge: 2023-10-05 | Disposition: A | Discharge status: Home / Self Care | Source: Ambulatory Visit | Attending: Nephrology

## 2023-10-05 VITALS — BP 103/57 | HR 63 | Temp 97.9°F | Resp 16

## 2023-10-05 DIAGNOSIS — N183 Chronic kidney disease, stage 3 unspecified: Secondary | ICD-10-CM

## 2023-10-05 LAB — POCT HEMOGLOBIN-HEMACUE: Hemoglobin: 10.6 g/dL — ABNORMAL LOW (ref 12.0–15.0)

## 2023-10-05 MED ORDER — EPOETIN ALFA-EPBX 10000 UNIT/ML IJ SOLN
INTRAMUSCULAR | Status: AC
  Filled 2023-10-05: qty 1

## 2023-10-05 MED ORDER — EPOETIN ALFA-EPBX 10000 UNIT/ML IJ SOLN
10000.0000 [IU] | Freq: Once | INTRAMUSCULAR | Status: AC
  Administered 2023-10-05: 10000 [IU] via SUBCUTANEOUS

## 2023-10-19 ENCOUNTER — Encounter (HOSPITAL_COMMUNITY)
Admission: RE | Admit: 2023-10-19 | Discharge: 2023-10-19 | Disposition: A | Discharge status: Home / Self Care | Source: Ambulatory Visit | Attending: Nephrology | Admitting: Nephrology

## 2023-10-19 VITALS — BP 90/49 | HR 66 | Temp 98.0°F | Resp 16

## 2023-10-19 DIAGNOSIS — N183 Chronic kidney disease, stage 3 unspecified: Secondary | ICD-10-CM | POA: Insufficient documentation

## 2023-10-19 DIAGNOSIS — D631 Anemia in chronic kidney disease: Secondary | ICD-10-CM | POA: Diagnosis present

## 2023-10-19 LAB — IRON AND TIBC
Iron: 65 ug/dL (ref 28–170)
Saturation Ratios: 20 % (ref 10.4–31.8)
TIBC: 329 ug/dL (ref 250–450)
UIBC: 264 ug/dL

## 2023-10-19 LAB — FERRITIN: Ferritin: 135 ng/mL (ref 11–307)

## 2023-10-19 MED ORDER — EPOETIN ALFA-EPBX 10000 UNIT/ML IJ SOLN
INTRAMUSCULAR | Status: AC
  Filled 2023-10-19: qty 1

## 2023-10-19 MED ORDER — EPOETIN ALFA-EPBX 10000 UNIT/ML IJ SOLN
10000.0000 [IU] | Freq: Once | INTRAMUSCULAR | Status: AC
  Administered 2023-10-19: 10000 [IU] via SUBCUTANEOUS

## 2023-10-20 LAB — POCT HEMOGLOBIN-HEMACUE: Hemoglobin: 10.9 g/dL — ABNORMAL LOW (ref 12.0–15.0)

## 2023-11-02 ENCOUNTER — Ambulatory Visit (HOSPITAL_COMMUNITY)
Admission: RE | Admit: 2023-11-02 | Discharge: 2023-11-02 | Disposition: A | Discharge status: Home / Self Care | Source: Ambulatory Visit | Attending: Nephrology | Admitting: Nephrology

## 2023-11-02 VITALS — BP 110/76 | HR 75 | Temp 97.0°F

## 2023-11-02 DIAGNOSIS — D631 Anemia in chronic kidney disease: Secondary | ICD-10-CM | POA: Insufficient documentation

## 2023-11-02 DIAGNOSIS — N183 Chronic kidney disease, stage 3 unspecified: Secondary | ICD-10-CM | POA: Insufficient documentation

## 2023-11-02 LAB — POCT HEMOGLOBIN-HEMACUE: Hemoglobin: 11 g/dL — ABNORMAL LOW (ref 12.0–15.0)

## 2023-11-02 MED ORDER — EPOETIN ALFA-EPBX 10000 UNIT/ML IJ SOLN
INTRAMUSCULAR | Status: AC
  Filled 2023-11-02: qty 1

## 2023-11-02 MED ORDER — EPOETIN ALFA-EPBX 10000 UNIT/ML IJ SOLN
10000.0000 [IU] | Freq: Once | INTRAMUSCULAR | Status: AC
  Administered 2023-11-02: 10000 [IU] via SUBCUTANEOUS

## 2023-11-10 ENCOUNTER — Telehealth: Payer: Self-pay | Admitting: Pharmacy Technician

## 2023-11-10 NOTE — Telephone Encounter (Signed)
 Auth Submission: APPROVED Site of care: Site of care: [facility] Payer: [employer] [place]  Medication & CPT/J Code(s) submitted: RETACRIT  Q5106 Diagnosis Code:  Route of submission (phone, fax, portal):  Phone #[phone] Fax # Auth type: Buy/Bill HB Units/visits requested: 10,000 UNITS Q14 DAYS Reference number: [id number] Approval from: 08/18/23 to 02/14/24

## 2023-11-16 ENCOUNTER — Encounter (HOSPITAL_COMMUNITY)
Admission: RE | Admit: 2023-11-16 | Discharge: 2023-11-16 | Disposition: A | Discharge status: Home / Self Care | Source: Ambulatory Visit | Attending: Nephrology | Admitting: Nephrology

## 2023-11-16 VITALS — BP 107/51 | HR 59 | Temp 97.2°F | Resp 16

## 2023-11-16 DIAGNOSIS — D631 Anemia in chronic kidney disease: Secondary | ICD-10-CM | POA: Diagnosis present

## 2023-11-16 DIAGNOSIS — N183 Chronic kidney disease, stage 3 unspecified: Secondary | ICD-10-CM | POA: Insufficient documentation

## 2023-11-16 LAB — IRON AND TIBC
Iron: 87 ug/dL (ref 28–170)
Saturation Ratios: 26 % (ref 10.4–31.8)
TIBC: 333 ug/dL (ref 250–450)
UIBC: 246 ug/dL

## 2023-11-16 LAB — POCT HEMOGLOBIN-HEMACUE: Hemoglobin: 10.7 g/dL — ABNORMAL LOW (ref 12.0–15.0)

## 2023-11-16 LAB — FERRITIN: Ferritin: 121 ng/mL (ref 11–307)

## 2023-11-16 MED ORDER — EPOETIN ALFA-EPBX 10000 UNIT/ML IJ SOLN
10000.0000 [IU] | Freq: Once | INTRAMUSCULAR | Status: AC
  Administered 2023-11-16: 10000 [IU] via SUBCUTANEOUS

## 2023-11-16 MED ORDER — EPOETIN ALFA-EPBX 10000 UNIT/ML IJ SOLN
INTRAMUSCULAR | Status: AC
  Filled 2023-11-16: qty 1

## 2023-11-24 ENCOUNTER — Other Ambulatory Visit (HOSPITAL_COMMUNITY): Payer: Self-pay | Admitting: Nephrology

## 2023-11-24 DIAGNOSIS — D631 Anemia in chronic kidney disease: Secondary | ICD-10-CM | POA: Insufficient documentation

## 2023-12-05 ENCOUNTER — Other Ambulatory Visit: Payer: Self-pay | Admitting: Family Medicine

## 2023-12-07 ENCOUNTER — Encounter (HOSPITAL_COMMUNITY)
Admission: RE | Admit: 2023-12-07 | Discharge: 2023-12-07 | Disposition: A | Discharge status: Home / Self Care | Source: Ambulatory Visit | Attending: Nephrology | Admitting: Nephrology

## 2023-12-07 VITALS — BP 111/61 | HR 63 | Temp 97.0°F | Resp 16

## 2023-12-07 DIAGNOSIS — N183 Chronic kidney disease, stage 3 unspecified: Secondary | ICD-10-CM | POA: Diagnosis not present

## 2023-12-07 DIAGNOSIS — D631 Anemia in chronic kidney disease: Secondary | ICD-10-CM

## 2023-12-07 LAB — POCT HEMOGLOBIN-HEMACUE: Hemoglobin: 10.1 g/dL — ABNORMAL LOW (ref 12.0–15.0)

## 2023-12-07 MED ORDER — EPOETIN ALFA-EPBX 10000 UNIT/ML IJ SOLN
10000.0000 [IU] | Freq: Once | INTRAMUSCULAR | Status: AC
  Administered 2023-12-07: 10000 [IU] via SUBCUTANEOUS

## 2023-12-07 MED ORDER — CLONIDINE HCL 0.1 MG PO TABS: 0.1000 mg | ORAL_TABLET | ORAL | Status: DC | PRN

## 2023-12-07 MED ORDER — EPOETIN ALFA-EPBX 10000 UNIT/ML IJ SOLN
INTRAMUSCULAR | Status: AC
  Filled 2023-12-07: qty 1

## 2023-12-09 ENCOUNTER — Ambulatory Visit: Admitting: Physician Assistant

## 2023-12-09 NOTE — Progress Notes (Deleted)
 Chief Complaint: Follow-up lower abdominal pain  HPI:    Brandi Cooper is a 78 year old female with a past medical history as listed below including CKD and diabetes, known to Dr. Legrand, who returns to clinic today for follow-up of lower abdominal pain.      12/05/2015 colonoscopy done for heme positive stool diverticulosis in the sigmoid colon nonbleeding internal hemorrhoids.    12/05/2015 EGD in [place]  with 3 cm hiatal hernia and nonbleeding superficial gastric ulcers with clean bases.  Largest lesion 5 mm.  Patient told to use Omeprazole 40 mg daily.  Repeat EGD in 10 to 12 weeks.    06/01/2018 office visit with Dr. 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  Discussed the possible history of ulcers.  She was describing dyspepsia years in the past and was started on Omeprazole.  At that point patient was told to wean off of Omeprazole as there is no clear records for exact indication.  Reported last colonoscopy 2014.  That point she was put into a 32-month recall for repeat colonoscopy given a possible family history of colon cancer in her son.    06/10/2022 laryngoscopy normal.    01/24/2023 MRI of the lumbar spine without contrast showed diffuse lumbar spine spondylosis with mild disc bulging.   4 /16/25 CTAP without contrast showed no acute findings in the abdomen or pelvis.    09/21/2023 hemoglobin checked and 11.1 (10.3 on/9/25).  Iron studies normal.  Ferritin normal.    09/26/2023 patient seen in clinic and described pain for the past 6 months in the lower part of the center of her abdomen described as twisting and knots.  A cramp that seized would last for a few seconds and then pass.  The only time she felt this is when getting up from a sitting position.  At that time thought musculoskeletal.  Gave her Dicyclomine  10 mg twice daily to see if this would help.  Past Medical History:  Diagnosis Date   Chronic kidney disease    Diabetes mellitus without complication (HCC)     GERD (gastroesophageal reflux disease)    Glaucoma    Heart disease     Past Surgical History:  Procedure Laterality Date   ABDOMINAL HYSTERECTOMY     blood transfusion     BREAST SURGERY     Breast Reduction   CERVICAL FUSION     EYE SURGERY     KNEE CARTILAGE SURGERY     LUMBAR DISC SURGERY     REDUCTION MAMMAPLASTY     RETINOPATHY SURGERY     SHOULDER SURGERY Bilateral    Rotator Cuff   TRABECULECTOMY     TUBAL LIGATION     VITRECTOMY AND CATARACT     WRIST SURGERY     Carpal Tunnel    Current Outpatient Medications  Medication Sig Dispense Refill   acetaZOLAMIDE (DIAMOX) 250 MG tablet      Ascorbic Acid (VITAMIN C) 1000 MG tablet Take 1,000 mg by mouth daily.     aspirin 81 MG tablet Take 81 mg by mouth daily.     atorvastatin  (LIPITOR) 10 MG tablet TAKE 1 TABLET(10 MG) BY MOUTH DAILY 90 tablet 3   Biotin 5000 MCG CAPS Take by mouth.     brimonidine (ALPHAGAN) 0.2 % ophthalmic solution 3 (three) times daily.     Cholecalciferol (VITAMIN D3) 50 MCG (2000 UT) capsule Take by mouth.     dicyclomine  (BENTYL ) 10 MG capsule Take 1  capsule (10 mg total) by mouth 2 (two) times daily. 60 capsule 3   dorzolamide-timolol (COSOPT) 22.3-6.8 MG/ML ophthalmic solution 1 drop 2 (two) times daily.     GNP CINNAMON PO Take by mouth. Takes 2000mg  2 capfuls daily     JANUVIA  25 MG tablet TAKE 1 TABLET(25 MG) BY MOUTH DAILY 30 tablet 5   lisinopril  (ZESTRIL ) 20 MG tablet TAKE 1 TABLET(20 MG) BY MOUTH DAILY (Patient taking differently: 5 mg daily. TAKE 1 TABLET(5 MG) BY MOUTH DAILY) 90 tablet 1   Omega-3 Fatty Acids (FISH OIL) 1200 MG CAPS Take 2 capsules by mouth. (Patient taking differently: Take 1 capsule by mouth daily.)     pioglitazone  (ACTOS ) 45 MG tablet TAKE 1 TABLET(45 MG) BY MOUTH DAILY 90 tablet 1   pregabalin  (LYRICA ) 25 MG capsule TAKE 1 CAPSULE(25 MG) BY MOUTH TWICE DAILY 60 capsule 2   ROCKLATAN 0.02-0.005 % SOLN Apply 1 drop to eye at bedtime.     sodium bicarbonate 650 MG  tablet Take 1,300 mg by mouth 2 (two) times daily.     tiZANidine  (ZANAFLEX ) 2 MG tablet Take 1-2 tablets (2-4 mg total) by mouth every 8 (eight) hours as needed (spasms, pain). 60 tablet 1   torsemide  (DEMADEX ) 20 MG tablet Take 1 tablet (20 mg total) by mouth 2 (two) times daily. 180 tablet 3   No current facility-administered medications for this visit.    Allergies as of 12/09/2023 - Review Complete 12/07/2023  Allergen Reaction Noted   Amitriptyline  03/30/2018   Nsaids  06/01/2018    Family History  Problem Relation Age of Onset   Diabetes Mother    Hypertension Mother    Stroke Mother    Diabetes Father    Arthritis Sister    Cancer Sister    Heart attack Sister    Cancer Brother    Hypertension Daughter    Cancer Son    Stroke Maternal Grandmother    Hypertension Maternal Grandmother    Diabetes Paternal Grandmother    Heart attack Paternal Grandfather    Cancer Sister    Cancer Sister    Stroke Sister    Hypertension Sister    Diabetes Brother    Kidney disease Brother     Social History   Socioeconomic History   Marital status: Married    Spouse name: Not on file   Number of children: 4   Years of education: Not on file   Highest education level: Not on file  Occupational History   Occupation: retired  Tobacco Use   Smoking status: Never   Smokeless tobacco: Never  Vaping Use   Vaping status: Never Used  Substance and Sexual Activity   Alcohol use: Never   Drug use: Never   Sexual activity: Not on file  Other Topics Concern   Not on file  Social History Narrative   Not on file   Social Drivers of Health   Financial Resource Strain: Low Risk  (01/20/2023)   Overall Financial Resource Strain (CARDIA)    Difficulty of Paying Living Expenses: Not hard at all  Food Insecurity: No Food Insecurity (01/20/2023)   Hunger Vital Sign    Worried About Running Out of Food in the Last Year: Never true    Ran Out of Food in the Last Year: Never true   Transportation Needs: No Transportation Needs (01/20/2023)   PRAPARE - Administrator, Civil Service (Medical): No    Lack of Transportation (Non-Medical):  No  Physical Activity: Inactive (01/20/2023)   Exercise Vital Sign    Days of Exercise per Week: 0 days    Minutes of Exercise per Session: 0 min  Stress: No Stress Concern Present (01/20/2023)   [employer] of Occupational Health - Occupational Stress Questionnaire    Feeling of Stress : Not at all  Social Connections: Moderately Integrated (01/20/2023)   Social Connection and Isolation Panel    Frequency of Communication with Friends and Family: More than three times a week    Frequency of Social Gatherings with Friends and Family: Three times a week    Attends Religious Services: 1 to 4 times per year    Active Member of Clubs or Organizations: No    Attends Banker Meetings: Never    Marital Status: Married  Catering manager Violence: Not At Risk (01/20/2023)   Humiliation, Afraid, Rape, and Kick questionnaire    Fear of Current or Ex-Partner: No    Emotionally Abused: No    Physically Abused: No    Sexually Abused: No    Review of Systems:    Constitutional: No weight loss, fever, chills, weakness or fatigue HEENT: Eyes: No change in vision               Ears, Nose, Throat:  No change in hearing or congestion Skin: No rash or itching Cardiovascular: No chest pain, chest pressure or palpitations   Respiratory: No SOB or cough Gastrointestinal: See HPI and otherwise negative Genitourinary: No dysuria or change in urinary frequency Neurological: No headache, dizziness or syncope Musculoskeletal: No new muscle or joint pain Hematologic: No bleeding or bruising Psychiatric: No history of depression or anxiety    Physical Exam:  Vital signs: There were no vitals taken for this visit.  Constitutional:   Pleasant Caucasian female appears to be in NAD, Well developed, Well nourished, alert and  cooperative Head:  Normocephalic and atraumatic. Eyes:   PEERL, EOMI. No icterus. Conjunctiva pink. Ears:  Normal auditory acuity. Neck:  Supple Throat: Oral cavity and pharynx without inflammation, swelling or lesion.  Respiratory: Respirations even and unlabored. Lungs clear to auscultation bilaterally.   No wheezes, crackles, or rhonchi.  Cardiovascular: Normal S1, S2. No MRG. Regular rate and rhythm. No peripheral edema, cyanosis or pallor.  Gastrointestinal:  Soft, nondistended, nontender. No rebound or guarding. Normal bowel sounds. No appreciable masses or hepatomegaly. Rectal:  Not performed.  Msk:  Symmetrical without gross deformities. Without edema, no deformity or joint abnormality.  Neurologic:  Alert and  oriented x4;  grossly normal neurologically.  Skin:   Dry and intact without significant lesions or rashes. Psychiatric: Oriented to person, place and time. Demonstrates good judgement and reason without abnormal affect or behaviors.  RELEVANT LABS AND IMAGING: CBC    Component Value Date/Time   WBC 6.1 06/15/2023 1019   RBC 3.58 (L) 06/15/2023 1019   HGB 10.1 (L) 12/07/2023 0912   HCT 32.7 (L) 06/15/2023 1019   PLT 170.0 06/15/2023 1019   MCV 91.2 06/15/2023 1019   MCH 29.3 03/05/2022 1236   MCHC 31.8 06/15/2023 1019   RDW 17.3 (H) 06/15/2023 1019   LYMPHSABS 0.6 (L) 03/01/2022 0839   MONOABS 0.2 03/01/2022 0839   EOSABS 0.0 03/01/2022 0839   BASOSABS 0.0 03/01/2022 0839    CMP     Component Value Date/Time   NA 145 07/25/2023 0000   K 4.1 07/25/2023 0000   CL 111 (A) 07/25/2023 0000   CO2 20 07/25/2023  0000   GLUCOSE 161 (H) 06/15/2023 1019   BUN 44 (A) 07/25/2023 0000   CREATININE 2.1 (A) 07/25/2023 0000   CREATININE 2.07 (H) 06/15/2023 1019   CREATININE 1.78 (H) 03/05/2022 1236   CALCIUM  8.3 (L) 06/15/2023 1019   CALCIUM  8.9 03/04/2021 0925   PROT 6.7 06/15/2023 1019   ALBUMIN 4.0 07/25/2023 0000   AST 11 06/15/2023 1019   ALT 10 06/15/2023 1019    ALKPHOS 79 06/15/2023 1019   BILITOT 0.5 06/15/2023 1019   GFRNONAA 32 (L) 03/01/2022 0839   GFRAA 32 10/03/2020 0000    Assessment: 1.  Abdominal pain:  Plan: 1. ***     Delon Failing, PA-C [facility] 12/09/2023, 9:14 AM  Cc: Kennyth Worth HERO, MD

## 2023-12-21 ENCOUNTER — Ambulatory Visit (INDEPENDENT_AMBULATORY_CARE_PROVIDER_SITE_OTHER)

## 2023-12-21 ENCOUNTER — Encounter (HOSPITAL_COMMUNITY)
Admission: RE | Admit: 2023-12-21 | Discharge: 2023-12-21 | Disposition: A | Discharge status: Home / Self Care | Source: Ambulatory Visit | Attending: Nephrology | Admitting: Nephrology

## 2023-12-21 VITALS — Ht 65.0 in | Wt 180.0 lb

## 2023-12-21 VITALS — BP 109/59 | HR 60 | Temp 97.2°F | Resp 16

## 2023-12-21 DIAGNOSIS — Z0000 Encounter for general adult medical examination without abnormal findings: Secondary | ICD-10-CM | POA: Diagnosis not present

## 2023-12-21 DIAGNOSIS — N184 Chronic kidney disease, stage 4 (severe): Secondary | ICD-10-CM | POA: Diagnosis present

## 2023-12-21 DIAGNOSIS — Z748 Other problems related to care provider dependency: Secondary | ICD-10-CM

## 2023-12-21 DIAGNOSIS — D631 Anemia in chronic kidney disease: Secondary | ICD-10-CM | POA: Insufficient documentation

## 2023-12-21 DIAGNOSIS — Z741 Need for assistance with personal care: Secondary | ICD-10-CM

## 2023-12-21 LAB — IRON AND TIBC
Iron: 65 ug/dL (ref 28–170)
Saturation Ratios: 20 % (ref 10.4–31.8)
TIBC: 321 ug/dL (ref 250–450)
UIBC: 256 ug/dL

## 2023-12-21 LAB — FERRITIN: Ferritin: 115 ng/mL (ref 11–307)

## 2023-12-21 LAB — POCT HEMOGLOBIN-HEMACUE: Hemoglobin: 10.3 g/dL — ABNORMAL LOW (ref 12.0–15.0)

## 2023-12-21 MED ORDER — EPOETIN ALFA-EPBX 10000 UNIT/ML IJ SOLN
10000.0000 [IU] | Freq: Once | INTRAMUSCULAR | Status: AC
  Administered 2023-12-21: 10000 [IU] via SUBCUTANEOUS

## 2023-12-21 MED ORDER — EPOETIN ALFA-EPBX 10000 UNIT/ML IJ SOLN
INTRAMUSCULAR | Status: AC
  Filled 2023-12-21: qty 1

## 2023-12-21 NOTE — Patient Instructions (Signed)
 Brandi Cooper,  Thank you for taking the time for your Medicare Wellness Visit. I appreciate your continued commitment to your health goals. Please review the care plan we discussed, and feel free to reach out if I can assist you further.  Medicare recommends these wellness visits once per year to help you and your care team stay ahead of potential health issues. These visits are designed to focus on prevention, allowing your provider to concentrate on managing your acute and chronic conditions during your regular appointments.  Please note that Annual Wellness Visits do not include a physical exam. Some assessments may be limited, especially if the visit was conducted virtually. If needed, we may recommend a separate in-person follow-up with your provider.  Ongoing Care Seeing your primary care provider every 3 to 6 months helps us  monitor your health and provide consistent, personalized care.   Referrals If a referral was made during today's visit and you haven't received any updates within two weeks, please contact the referred provider directly to check on the status.  Recommended Screenings:  Health Maintenance  Topic Date Due   Hepatitis C Screening  Never done   Complete foot exam   09/26/2019   Eye exam for diabetics  07/23/2021   Hemoglobin A1C  12/15/2023   DTaP/Tdap/Td vaccine (1 - Tdap) 06/14/2024*   Yearly kidney health urinalysis for diabetes  06/14/2024   Yearly kidney function blood test for diabetes  07/26/2024   Medicare Annual Wellness Visit  12/20/2024   DEXA scan (bone density measurement)  Completed   Meningitis B Vaccine  Aged Out   Pneumococcal Vaccine for age over 73  Discontinued   Flu Shot  Discontinued   Breast Cancer Screening  Discontinued   Colon Cancer Screening  Discontinued   COVID-19 Vaccine  Discontinued   Zoster (Shingles) Vaccine  Discontinued  *Topic was postponed. The date shown is not the original due date.       12/21/2023    3:46 PM   Advanced Directives  Does Patient Have a Medical Advance Directive? No  Would patient like information on creating a medical advance directive? No - Patient declined   Advance Care Planning is important because it: Ensures you receive medical care that aligns with your values, goals, and preferences. Provides guidance to your family and loved ones, reducing the emotional burden of decision-making during critical moments.  Vision: Annual vision screenings are recommended for early detection of glaucoma, cataracts, and diabetic retinopathy. These exams can also reveal signs of chronic conditions such as diabetes and high blood pressure.  Dental: Annual dental screenings help detect early signs of oral cancer, gum disease, and other conditions linked to overall health, including heart disease and diabetes.  Please see the attached documents for additional preventive care recommendations.

## 2023-12-21 NOTE — Progress Notes (Signed)
 Subjective:   Marlie Kuennen is a 78 y.o. who presents for a Medicare Wellness preventive visit.  As a reminder, Annual Wellness Visits don't include a physical exam, and some assessments may be limited, especially if this visit is performed virtually. We may recommend an in-person follow-up visit with your provider if needed.  Visit Complete: Virtual I connected with  Elveria Brooking on 12/21/23 by a audio enabled telemedicine application and verified that I am speaking with the correct person using two identifiers.  Patient Location: Home  Provider Location: Home Office  I discussed the limitations of evaluation and management by telemedicine. The patient expressed understanding and agreed to proceed.  Vital Signs: Because this visit was a virtual/telehealth visit, some criteria may be missing or patient reported. Any vitals not documented were not able to be obtained and vitals that have been documented are patient reported.  VideoDeclined- This patient declined Librarian, academic. Therefore the visit was completed with audio only.  Persons Participating in Visit: Patient.  AWV Questionnaire: No: Patient Medicare AWV questionnaire was not completed prior to this visit.  Cardiac Risk Factors include: advanced age (>74men, >66 women);hypertension     Objective:    Today's Vitals   12/21/23 1542  Weight: 180 lb (81.6 kg)  Height: 5' 5 (1.651 m)   Body mass index is 29.95 kg/m.     12/21/2023    3:46 PM 01/20/2023    3:02 PM 12/10/2021    1:08 PM 03/25/2021   11:14 AM 11/27/2020    1:24 PM 10/12/2019    2:22 PM  Advanced Directives  Does Patient Have a Medical Advance Directive? No No No No No No  Would patient like information on creating a medical advance directive? No - Patient declined No - Patient declined No - Patient declined Yes (MAU/Ambulatory/Procedural Areas - Information given) Yes (MAU/Ambulatory/Procedural Areas - Information given)  Yes (ED - Information included in AVS)    Current Medications (verified) Outpatient Encounter Medications as of 12/21/2023  Medication Sig   acetaZOLAMIDE (DIAMOX) 250 MG tablet    Ascorbic Acid (VITAMIN C) 1000 MG tablet Take 1,000 mg by mouth daily.   aspirin 81 MG tablet Take 81 mg by mouth daily.   atorvastatin  (LIPITOR) 10 MG tablet TAKE 1 TABLET(10 MG) BY MOUTH DAILY   Biotin 5000 MCG CAPS Take by mouth.   brimonidine (ALPHAGAN) 0.2 % ophthalmic solution 3 (three) times daily.   Cholecalciferol (VITAMIN D3) 50 MCG (2000 UT) capsule Take by mouth.   dicyclomine  (BENTYL ) 10 MG capsule Take 1 capsule (10 mg total) by mouth 2 (two) times daily.   dorzolamide-timolol (COSOPT) 22.3-6.8 MG/ML ophthalmic solution 1 drop 2 (two) times daily.   GNP CINNAMON PO Take by mouth. Takes 2000mg  2 capfuls daily   JANUVIA  25 MG tablet TAKE 1 TABLET(25 MG) BY MOUTH DAILY   lisinopril  (ZESTRIL ) 20 MG tablet TAKE 1 TABLET(20 MG) BY MOUTH DAILY   Omega-3 Fatty Acids (FISH OIL) 1200 MG CAPS Take 2 capsules by mouth.   pioglitazone  (ACTOS ) 45 MG tablet TAKE 1 TABLET(45 MG) BY MOUTH DAILY   pregabalin  (LYRICA ) 25 MG capsule TAKE 1 CAPSULE(25 MG) BY MOUTH TWICE DAILY   ROCKLATAN 0.02-0.005 % SOLN Apply 1 drop to eye at bedtime.   sodium bicarbonate 650 MG tablet Take 1,300 mg by mouth 2 (two) times daily.   tiZANidine  (ZANAFLEX ) 2 MG tablet Take 1-2 tablets (2-4 mg total) by mouth every 8 (eight) hours as needed (spasms, pain).  torsemide  (DEMADEX ) 20 MG tablet Take 1 tablet (20 mg total) by mouth 2 (two) times daily.   [EXPIRED] epoetin  alfa-epbx (RETACRIT ) injection 10,000 Units    No facility-administered encounter medications on file as of 12/21/2023.    Allergies (verified) Amitriptyline and Nsaids   History: Past Medical History:  Diagnosis Date   Chronic kidney disease    Diabetes mellitus without complication (HCC)    GERD (gastroesophageal reflux disease)    Glaucoma    Heart disease     Past Surgical History:  Procedure Laterality Date   ABDOMINAL HYSTERECTOMY     blood transfusion     BREAST SURGERY     Breast Reduction   CERVICAL FUSION     EYE SURGERY     KNEE CARTILAGE SURGERY     LUMBAR DISC SURGERY     REDUCTION MAMMAPLASTY     RETINOPATHY SURGERY     SHOULDER SURGERY Bilateral    Rotator Cuff   TRABECULECTOMY     TUBAL LIGATION     VITRECTOMY AND CATARACT     WRIST SURGERY     Carpal Tunnel   Family History  Problem Relation Age of Onset   Diabetes Mother    Hypertension Mother    Stroke Mother    Diabetes Father    Arthritis Sister    Cancer Sister    Heart attack Sister    Cancer Brother    Hypertension Daughter    Cancer Son    Stroke Maternal Grandmother    Hypertension Maternal Grandmother    Diabetes Paternal Grandmother    Heart attack Paternal Grandfather    Cancer Sister    Cancer Sister    Stroke Sister    Hypertension Sister    Diabetes Brother    Kidney disease Brother    Social History   Socioeconomic History   Marital status: Married    Spouse name: Not on file   Number of children: 4   Years of education: Not on file   Highest education level: Not on file  Occupational History   Occupation: retired  Tobacco Use   Smoking status: Never   Smokeless tobacco: Never  Vaping Use   Vaping status: Never Used  Substance and Sexual Activity   Alcohol use: Never   Drug use: Never   Sexual activity: Not on file  Other Topics Concern   Not on file  Social History Narrative   Not on file   Social Drivers of Health   Financial Resource Strain: Low Risk  (12/21/2023)   Overall Financial Resource Strain (CARDIA)    Difficulty of Paying Living Expenses: Not hard at all  Food Insecurity: No Food Insecurity (12/21/2023)   Hunger Vital Sign    Worried About Running Out of Food in the Last Year: Never true    Ran Out of Food in the Last Year: Never true  Transportation Needs: Unmet Transportation Needs (12/21/2023)    PRAPARE - Administrator, Civil Service (Medical): Yes    Lack of Transportation (Non-Medical): No  Physical Activity: Inactive (12/21/2023)   Exercise Vital Sign    Days of Exercise per Week: 0 days    Minutes of Exercise per Session: 0 min  Stress: No Stress Concern Present (12/21/2023)   [employer] of Occupational Health - Occupational Stress Questionnaire    Feeling of Stress: Not at all  Social Connections: Moderately Integrated (12/21/2023)   Social Connection and Isolation Panel    Frequency of  Communication with Friends and Family: More than three times a week    Frequency of Social Gatherings with Friends and Family: More than three times a week    Attends Religious Services: More than 4 times per year    Active Member of [employer] or Organizations: No    Attends Engineer, structural: Never    Marital Status: Married    Tobacco Counseling Counseling given: Not Answered    Clinical Intake:  Pre-visit preparation completed: Yes  Pain : No/denies pain     BMI - recorded: 29.95 Nutritional Status: BMI 25 -29 Overweight Nutritional Risks: None Diabetes: Yes CBG done?: No Did pt. bring in CBG monitor from home?: No  Lab Results  Component Value Date   HGBA1C 6.5 (A) 06/15/2023   HGBA1C 6.8 (A) 12/02/2022   HGBA1C 7.9 (H) 09/01/2022     How often do you need to have someone help you when you read instructions, pamphlets, or other written materials from your doctor or pharmacy?: 1 - Never  Interpreter Needed?: No  Information entered by :: Ellouise Haws, LPN   Activities of Daily Living     12/21/2023    3:43 PM 01/20/2023    2:46 PM  In your present state of health, do you have any difficulty performing the following activities:  Hearing? 0 0  Vision? 0 0  Difficulty concentrating or making decisions? 0 0  Walking or climbing stairs? 1 0  Comment uses a cane due to back  Dressing or bathing? 0 0  Doing errands, shopping? 0 0   Preparing Food and eating ? N N  Using the Toilet? N N  In the past six months, have you accidently leaked urine? CINDERELLA CINDERELLA  Comment wears briefs   Do you have problems with loss of bowel control? N Y  Comment  wears a pad  Managing your Medications? N N  Managing your Finances? N N  Housekeeping or managing your Housekeeping? N N    Patient Care Team: Kennyth Worth HERO, MD as PCP - General (Family Medicine)  I have updated your Care Teams any recent Medical Services you may have received from other providers in the past year.     Assessment:   This is a routine wellness examination for [place].  Hearing/Vision screen Hearing Screening - Comments:: Pt denies any hearing issues  Vision Screening - Comments:: Wears rx glasses - not up to date with routine eye exams with Dr Octavia    Goals Addressed             This Visit's Progress    Patient Stated       Maintain health and activity        Depression Screen     12/21/2023    3:47 PM 06/15/2023    8:52 AM 01/20/2023    2:51 PM 12/02/2022   11:13 AM 09/01/2022    9:20 AM 08/02/2022   11:21 AM 03/05/2022   11:46 AM  PHQ 2/9 Scores  PHQ - 2 Score 0 0 0 0 0 0 0    Fall Risk     12/21/2023    3:46 PM 06/15/2023    8:52 AM 01/20/2023    3:00 PM 12/02/2022   11:13 AM 09/01/2022    9:20 AM  Fall Risk   Falls in the past year? 0 0 0 0 0  Number falls in past yr: 0 0 0 0 0  Injury with Fall? 0 0 0 0  0  Risk for fall due to : Impaired balance/gait;Impaired mobility;Impaired vision No Fall Risks Impaired balance/gait;Impaired mobility;Impaired vision Impaired balance/gait Impaired balance/gait;No Fall Risks  Risk for fall due to: Comment    use a cane to walk   Follow up Falls prevention discussed  Falls prevention discussed      MEDICARE RISK AT HOME:  Medicare Risk at Home Any stairs in or around the home?: No If so, are there any without handrails?: No Home free of loose throw rugs in walkways, pet beds, electrical cords,  etc?: Yes Adequate lighting in your home to reduce risk of falls?: Yes Life alert?: No Use of a cane, walker or w/c?: Yes Grab bars in the bathroom?: No Shower chair or bench in shower?: No Elevated toilet seat or a handicapped toilet?: No  TIMED UP AND GO:  Was the test performed?  No  Cognitive Function: 6CIT completed        12/21/2023    3:52 PM 01/20/2023    3:00 PM 12/10/2021    1:14 PM 11/27/2020    1:30 PM 10/12/2019    2:27 PM  6CIT Screen  What Year? 0 points 0 points 0 points 0 points 0 points  What month? 0 points 0 points 0 points 0 points 0 points  What time? 0 points 0 points 0 points 0 points 0 points  Count back from 20 0 points 0 points 0 points 0 points 0 points  Months in reverse 0 points 0 points 0 points 0 points 0 points  Repeat phrase 0 points 0 points 0 points 0 points 4 points  Total Score 0 points 0 points 0 points 0 points 4 points    Immunizations Immunization History  Administered Date(s) Administered   Moderna SARS-COV2 Booster Vaccination 09/17/2020   Moderna Sars-Covid-2 Vaccination 08/13/2019, 09/10/2019    Screening Tests Health Maintenance  Topic Date Due   Hepatitis C Screening  Never done   FOOT EXAM  09/26/2019   OPHTHALMOLOGY EXAM  07/23/2021   HEMOGLOBIN A1C  12/15/2023   DTaP/Tdap/Td (1 - Tdap) 06/14/2024 (Originally 08/30/1964)   Diabetic kidney evaluation - Urine ACR  06/14/2024   Diabetic kidney evaluation - eGFR measurement  07/26/2024   Medicare Annual Wellness (AWV)  12/20/2024   DEXA SCAN  Completed   Meningococcal B Vaccine  Aged Out   Pneumococcal Vaccine: 50+ Years  Discontinued   Influenza Vaccine  Discontinued   Mammogram  Discontinued   Colonoscopy  Discontinued   COVID-19 Vaccine  Discontinued   Zoster Vaccines- Shingrix  Discontinued    Health Maintenance Items Addressed: See Nurse Notes at the end of this note  Additional Screening:  Vision Screening: Recommended annual ophthalmology exams for early  detection of glaucoma and other disorders of the eye. Is the patient up to date with their annual eye exam?  No  Who is the provider or what is the name of the office in which the patient attends annual eye exams? Dr Octavia pt stated appt coming up   Dental Screening: Recommended annual dental exams for proper oral hygiene  Community Resource Referral / Chronic Care Management: CRR required this visit?  Yes   CCM required this visit?  No   Plan:    I have personally reviewed and noted the following in the patient's chart:   Medical and social history Use of alcohol, tobacco or illicit drugs  Current medications and supplements including opioid prescriptions. Patient is not currently taking opioid prescriptions. Functional ability  and status Nutritional status Physical activity Advanced directives List of other physicians Hospitalizations, surgeries, and ER visits in previous 12 months Vitals Screenings to include cognitive, depression, and falls Referrals and appointments  In addition, I have reviewed and discussed with patient certain preventive protocols, quality metrics, and best practice recommendations. A written personalized care plan for preventive services as well as general preventive health recommendations were provided to patient.   Ellouise VEAR Haws, LPN   89/03/7972   After Visit Summary: (Declined) Due to this being a telephonic visit, with patients personalized plan was offered to patient but patient Declined AVS at this time   Notes: Nothing significant to report at this time.

## 2023-12-23 ENCOUNTER — Telehealth: Payer: Self-pay | Admitting: *Deleted

## 2023-12-23 NOTE — Progress Notes (Signed)
 Complex Care Management Note Care Guide Note  12/23/2023 Name: Brandi Cooper MRN: [id number] DOB: [birth year]   Complex Care Management Outreach Attempts: An unsuccessful telephone outreach was attempted today to offer the patient information about available complex care management services.  Follow Up Plan:  Additional outreach attempts will be made to offer the patient complex care management information and services.   Encounter Outcome:  No Answer  Thedford Franks, CMA [place]  [facility], [facility] Guide Direct Dial: [phone]  Fax: [phone] Website: [email or web address]

## 2023-12-26 NOTE — Progress Notes (Signed)
 Complex Care Management Note  Care Guide Note 12/26/2023 Name: Brandi Cooper MRN: [id number] DOB: 1945/07/26  Brandi Cooper is a 78 y.o. year old female who sees Kennyth Worth HERO, MD for primary care. I reached out to Elveria Brooking by phone today to offer complex care management services.  Ms. Digangi was given information about Complex Care Management services today including:   The Complex Care Management services include support from the care team which includes your Nurse Care Manager, Clinical Social Worker, or Pharmacist.  The Complex Care Management team is here to help remove barriers to the health concerns and goals most important to you. Complex Care Management services are voluntary, and the patient may decline or stop services at any time by request to their care team member.   Complex Care Management Consent Status: Patient agreed to services and verbal consent obtained.   Follow up plan:  Telephone appointment with complex care management team member scheduled for:  12/29/2023 and 01/02/2024  Encounter Outcome:  Patient Scheduled  Thedford Franks, CMA [place]  [facility], [facility] Guide Direct Dial: [phone]  Fax: [phone] Website: [email or web address]

## 2023-12-29 ENCOUNTER — Other Ambulatory Visit: Payer: Self-pay

## 2023-12-29 NOTE — Patient Instructions (Signed)
 Visit Information  Thank you for taking time to visit with me today. Please don't hesitate to contact me if I can be of assistance to you before our next scheduled appointment.  Our next appointment is by telephone on 01/12/24 at 10am Please call the care guide team at [phone] if you need to cancel or reschedule your appointment.   Following is a copy of your care plan:   Goals Addressed             This Visit's Progress    BSW VBCI Social Work Care Plan       Problems:   Transportation  CSW Clinical Goal(s):   Over the next 2 weeks the Patient will work with Child psychotherapist to address concerns related to Transportation.  Interventions:  Social Determinants of Health in Patient with CKD Stage 4, DMII, and HTN: SDOH assessments completed: Transportation Evaluation of current treatment plan related to unmet needs Patient can't ride the regular bus due to vision and mobility issues.  Patient is helped by her son-in-law while he is out of work, but will need transportation soon once he returns to work.  SW completes a SCAT application and submits Part B to provider for review.  Patient Goals/Self-Care Activities:  Patient will await SCAT approval  Plan:   Telephone follow up appointment with care management team member scheduled for:  01/12/24 at 10am.        Please call 911 if you are experiencing a Mental Health or Behavioral Health Crisis or need someone to talk to.  Patient verbalizes understanding of instructions and care plan provided today and agrees to view in MyChart. Active MyChart status and patient understanding of how to access instructions and care plan via MyChart confirmed with patient.     Tillman Gardener, BSW [place]  [facility], [facility] Social Worker Direct Dial: [phone]  Fax: [phone] Website: [email or web address]

## 2023-12-29 NOTE — Patient Outreach (Signed)
 Complex Care Management   Visit Note  12/29/2023  Name:  Brandi Cooper MRN: [id number] DOB: 1945-07-20  Situation: Referral received for Complex Care Management related to SDOH Barriers:  Transportation I obtained verbal consent from Patient.  Visit completed with Patient  on the phone  Background:   Past Medical History:  Diagnosis Date   Chronic kidney disease    Diabetes mellitus without complication (HCC)    GERD (gastroesophageal reflux disease)    Glaucoma    Heart disease     Assessment:  Patient can't ride the regular bus due to vision and mobility issues.  Patient is helped by her son-in-law while he is out of work, but will need transportation soon once he returns to work.  SW completes a SCAT application and submits Part B to provider for review.    SDOH Interventions    Flowsheet Row Clinical Support from 12/21/2023 in [facility] [place] Clinical Support from 01/20/2023 in [facility] [place] Clinical Support from 12/10/2021 in [facility] [place] Telephone from 12/09/2020 in [facility]  SDOH Interventions      Food Insecurity Interventions Intervention Not Indicated Intervention Not Indicated Intervention Not Indicated --  Housing Interventions Intervention Not Indicated Intervention Not Indicated Intervention Not Indicated --  Transportation Interventions Intervention Not Indicated Intervention Not Indicated Intervention Not Indicated [facility]  Utilities Interventions Intervention Not Indicated Intervention Not Indicated -- --  Alcohol Usage Interventions Intervention Not Indicated (Score <7) -- -- --  Financial Strain Interventions Intervention Not Indicated Intervention Not Indicated Intervention Not Indicated --  Physical Activity Interventions Intervention Not Indicated Intervention Not Indicated Intervention Not Indicated --  Stress Interventions  Intervention Not Indicated Intervention Not Indicated Intervention Not Indicated --  Social Connections Interventions Intervention Not Indicated Intervention Not Indicated Intervention Not Indicated --  Health Literacy Interventions Intervention Not Indicated Intervention Not Indicated -- --      Recommendation:   SW provides SCAT application Part B to provider for review.  Follow Up Plan:   Telephone follow up appointment date/time:  01/12/24 at 10am  Tillman Gardener, BSW [place]  [facility], [facility] Social Worker Direct Dial: [phone]  Fax: [phone] Website: [email or web address]

## 2024-01-01 ENCOUNTER — Other Ambulatory Visit: Payer: Self-pay | Admitting: Orthopedic Surgery

## 2024-01-02 ENCOUNTER — Other Ambulatory Visit: Payer: Self-pay

## 2024-01-04 ENCOUNTER — Ambulatory Visit (HOSPITAL_COMMUNITY)
Admission: RE | Admit: 2024-01-04 | Discharge: 2024-01-04 | Disposition: A | Discharge status: Home / Self Care | Source: Ambulatory Visit | Attending: Nephrology | Admitting: Nephrology

## 2024-01-04 VITALS — BP 117/54 | HR 58 | Temp 97.3°F | Resp 16

## 2024-01-04 DIAGNOSIS — N184 Chronic kidney disease, stage 4 (severe): Secondary | ICD-10-CM | POA: Insufficient documentation

## 2024-01-04 DIAGNOSIS — D631 Anemia in chronic kidney disease: Secondary | ICD-10-CM | POA: Insufficient documentation

## 2024-01-04 LAB — POCT HEMOGLOBIN-HEMACUE: Hemoglobin: 10.4 g/dL — ABNORMAL LOW (ref 12.0–15.0)

## 2024-01-04 MED ORDER — EPOETIN ALFA-EPBX 10000 UNIT/ML IJ SOLN
INTRAMUSCULAR | Status: AC
  Filled 2024-01-04: qty 1

## 2024-01-04 MED ORDER — EPOETIN ALFA-EPBX 10000 UNIT/ML IJ SOLN
10000.0000 [IU] | Freq: Once | INTRAMUSCULAR | Status: AC
  Administered 2024-01-04: 10000 [IU] via SUBCUTANEOUS

## 2024-01-12 ENCOUNTER — Other Ambulatory Visit: Payer: Self-pay

## 2024-01-12 ENCOUNTER — Ambulatory Visit: Payer: Self-pay

## 2024-01-12 NOTE — Patient Outreach (Signed)
 Social Drivers of Health  Community Resource and Care Coordination Visit Note   01/12/2024  Name: Brandi Cooper MRN: [id number] DOB:[birth year]  Situation: Referral received for [facility] needs assessment and assistance related to Transportation. I obtained verbal consent from Patient.  Visit completed with Patient on the phone.   Background:   SDOH Interventions Today    Flowsheet Row Most Recent Value  SDOH Interventions   Transportation Interventions Other (Comment)  [SCAT application submitted to provider and SW will follow up on status]     Assessment:   Goals Addressed             This Visit's Progress    BSW Goals       Current SDOH Barriers:  Transportation  Interventions: Referred patient to community resources  Collaborated with primary care provider re: status of SCAT application*          Recommendation:   Await update on status of SCAT application from provider.  Follow Up Plan:   Telephone follow up appointment date/time:  01/19/24 at 10am  Tillman Gardener, BSW [place]  [facility], [facility] Social Worker Direct Dial: [phone]  Fax: [phone] Website: [email or web address]

## 2024-01-12 NOTE — Patient Instructions (Signed)
 Visit Information  Thank you for taking time to visit with me today. Please don't hesitate to contact me if I can be of assistance to you before our next scheduled appointment.  Your next care management appointment is by telephone on 01/19/24 at 10am   Please call the care guide team at [phone] if you need to cancel, schedule, or reschedule an appointment.   Please call 911 if you are experiencing a Mental Health or Behavioral Health Crisis or need someone to talk to.  Tillman Gardener, BSW [place]  [facility], [facility] Social Worker Direct Dial: [phone]  Fax: [phone] Website: [email or web address]

## 2024-01-12 NOTE — Telephone Encounter (Signed)
 FYI Only or Action Required?: FYI only for provider: appointment scheduled on 10/31.  Patient was last seen in primary care on 06/15/2023 by Kennyth Worth HERO, MD.  Called Nurse Triage reporting Leg Swelling.  Symptoms began a week ago.  Interventions attempted: Prescription medications: torsemide  25mg .  Symptoms are: gradually worsening.  Triage Disposition: See Physician Within 24 Hours  Patient/caregiver understands and will follow disposition?: Yes          Copied from CRM #[id number]. Topic: Clinical - Red Word Triage >> Jan 12, 2024 10:19 AM Tinnie BROCKS wrote: Red Word that prompted transfer to Nurse Triage: Was wanting to schedule a general follow up, but then mentioned her left leg is swelling and has pain in knee. Established with [facility] Reason for Disposition  [1] MODERATE leg swelling (e.g., swelling extends up to knees) AND [2] new-onset or getting worse  Answer Assessment - Initial Assessment Questions 1. ONSET: When did the swelling start? (e.g., minutes, hours, days)     X 1 week  2. LOCATION: What part of the leg is swollen?  Are both legs swollen or just one leg?     Left knee down to the ankle   3. SEVERITY: How bad is the swelling? (e.g., localized; mild, moderate, severe)     Moderate   4. REDNESS: Is there redness or signs of infection?     No   5. PAIN: Is the swelling painful to touch? If Yes, ask: How painful is it?   (Scale 1-10; mild, moderate or severe)     6/10  6. FEVER: Do you have a fever? If Yes, ask: What is it, how was it measured, and when did it start?      No   7. CAUSE: What do you think is causing the leg swelling?     Fluid retention   8. MEDICAL HISTORY: Do you have a history of blood clots (e.g., DVT), cancer, heart failure, kidney disease, or liver failure?     No   9. RECURRENT SYMPTOM: Have you had leg swelling before? If Yes, ask: When was the last time? What happened that time?     Yes,  ongoing   10. OTHER SYMPTOMS: Do you have any other symptoms? (e.g., chest pain, difficulty breathing)  No   Patient is currently on torsemide  20mg . Appt. Scheduled for evaluation.  Protocols used: Leg Swelling and Edema-A-AH

## 2024-01-13 ENCOUNTER — Telehealth: Payer: Self-pay | Admitting: *Deleted

## 2024-01-13 ENCOUNTER — Encounter: Payer: Self-pay | Admitting: Family Medicine

## 2024-01-13 ENCOUNTER — Ambulatory Visit (INDEPENDENT_AMBULATORY_CARE_PROVIDER_SITE_OTHER): Admitting: Family Medicine

## 2024-01-13 VITALS — BP 120/70 | HR 54 | Temp 97.2°F | Ht 65.0 in | Wt 189.0 lb

## 2024-01-13 DIAGNOSIS — N183 Chronic kidney disease, stage 3 unspecified: Secondary | ICD-10-CM | POA: Diagnosis not present

## 2024-01-13 DIAGNOSIS — R600 Localized edema: Secondary | ICD-10-CM

## 2024-01-13 DIAGNOSIS — E1169 Type 2 diabetes mellitus with other specified complication: Secondary | ICD-10-CM

## 2024-01-13 DIAGNOSIS — E785 Hyperlipidemia, unspecified: Secondary | ICD-10-CM

## 2024-01-13 DIAGNOSIS — I152 Hypertension secondary to endocrine disorders: Secondary | ICD-10-CM

## 2024-01-13 DIAGNOSIS — E1159 Type 2 diabetes mellitus with other circulatory complications: Secondary | ICD-10-CM

## 2024-01-13 DIAGNOSIS — Z7985 Long-term (current) use of injectable non-insulin antidiabetic drugs: Secondary | ICD-10-CM

## 2024-01-13 DIAGNOSIS — E1122 Type 2 diabetes mellitus with diabetic chronic kidney disease: Secondary | ICD-10-CM

## 2024-01-13 DIAGNOSIS — N184 Chronic kidney disease, stage 4 (severe): Secondary | ICD-10-CM

## 2024-01-13 LAB — TSH: TSH: 3.88 u[IU]/mL (ref 0.35–5.50)

## 2024-01-13 LAB — CBC
HCT: 35.4 % — ABNORMAL LOW (ref 36.0–46.0)
Hemoglobin: 11.2 g/dL — ABNORMAL LOW (ref 12.0–15.0)
MCHC: 31.6 g/dL (ref 30.0–36.0)
MCV: 91 fl (ref 78.0–100.0)
Platelets: 152 K/uL (ref 150.0–400.0)
RBC: 3.89 Mil/uL (ref 3.87–5.11)
RDW: 18.3 % — ABNORMAL HIGH (ref 11.5–15.5)
WBC: 3.5 K/uL — ABNORMAL LOW (ref 4.0–10.5)

## 2024-01-13 LAB — COMPREHENSIVE METABOLIC PANEL WITH GFR
ALT: 9 U/L (ref 0–35)
AST: 12 U/L (ref 0–37)
Albumin: 3.9 g/dL (ref 3.5–5.2)
Alkaline Phosphatase: 88 U/L (ref 39–117)
BUN: 43 mg/dL — ABNORMAL HIGH (ref 6–23)
CO2: 24 meq/L (ref 19–32)
Calcium: 8.6 mg/dL (ref 8.4–10.5)
Chloride: 112 meq/L (ref 96–112)
Creatinine, Ser: 1.85 mg/dL — ABNORMAL HIGH (ref 0.40–1.20)
GFR: 26 mL/min — ABNORMAL LOW (ref >60.00)
Glucose, Bld: 94 mg/dL (ref 70–99)
Potassium: 4.3 meq/L (ref 3.5–5.1)
Sodium: 144 meq/L (ref 135–145)
Total Bilirubin: 0.5 mg/dL (ref 0.2–1.2)
Total Protein: 6.8 g/dL (ref 6.0–8.3)

## 2024-01-13 LAB — LIPID PANEL
Cholesterol: 140 mg/dL (ref 0–200)
HDL: 65 mg/dL (ref >39.00)
LDL Cholesterol: 61 mg/dL (ref 0–99)
NonHDL: 74.85
Total CHOL/HDL Ratio: 2
Triglycerides: 70 mg/dL (ref 0.0–149.0)
VLDL: 14 mg/dL (ref 0.0–40.0)

## 2024-01-13 LAB — HEMOGLOBIN A1C: Hgb A1c MFr Bld: 6.5 % (ref 4.6–6.5)

## 2024-01-13 NOTE — Assessment & Plan Note (Signed)
 Continue management per nephrology.  She is currently on torsemide  20 mg twice daily

## 2024-01-13 NOTE — Progress Notes (Addendum)
 Brandi Cooper is a 78 y.o. female who presents today for an office visit.  Assessment/Plan:  New/Acute Problems: Leg Edema  Patient with bilateral 1-2+ pitting edema.  Discussed with patient this is likely due to a side effect of her Actos .   she does have chronic baseline leg edema that is being treated with torsemide  per nephrology secondary to her CKD. Will also check labs including CBC, c-Met, TSH. We did discuss potential workup including echocardiogram however we will try off Actos  to see if this improves her symptoms.  Low suspicion for CHF and she has no signs of volume overload today.  She will follow-up with me in a couple of weeks.  If symptoms persist despite above would consider checking echocardiogram at that time.   Chronic Problems Addressed Today: Type 2 diabetes mellitus with stage 4 chronic kidney disease (HCC) Check A1c with labs.  We are stopping her Actos  as above due to her lower extremity edema.  She will continue Januvia  25 mg daily.  Depending on results of A1c may need to try switching to GLP agonist.  CKD (chronic kidney disease) stage 4, GFR 15-29 ml/min (HCC) Continue management per nephrology.  She is currently on torsemide  20 mg twice daily  Hypertension associated with diabetes (HCC) Blood pressure at goal today on lisinopril  20 mg daily.  Dyslipidemia due to type 2 diabetes mellitus (HCC) She is on Lipitor 10 mg daily.  Check labs.     Subjective:  HPI:  See assessment / plan for status of chronic conditions.     Discussed the use of AI scribe software for clinical note transcription with the patient, who gave verbal consent to proceed.  History of Present Illness Brandi Cooper is a 78 year old female with diabetes who presents with leg swelling and knee pain.  She experiences significant swelling in her legs, particularly around the knee, describing it as feeling like 'something was shattering or fracturing.' There have been no falls or  injuries. The swelling is persistent, and she takes torsemide  twice daily. Elevating her legs helps reduce the swelling.  She has diabetes and is taking pioglitazone . Neuropathy complicates her ability to localize pain in her legs. She uses Voltaren gel for pain relief, which provides some benefit.  She has not been diagnosed with congestive heart failure, despite a suggestion from a family member.  She has difficulty with vision, particularly in one eye, affecting her ability to shop independently. She compensates by taking longer to shop, ensuring she sees items on both sides of the aisle.  She is planning to undergo dental work due to losing teeth and having roots remaining from broken teeth. This has impacted her eating habits, as she lacks jaw teeth, affecting her nutrition.  She is working on obtaining an passenger transport manager and resolving transportation issues with the help of family members.         Objective:  Physical Exam: BP 120/70   Pulse (!) 54   Temp (!) 97.2 F (36.2 C) (Temporal)   Ht 5' 5 (1.651 m)   Wt 189 lb (85.7 kg)   SpO2 95%   BMI 31.45 kg/m   Gen: No acute distress, resting comfortably CV: Regular rate and rhythm with no murmurs appreciated Pulm: Normal work of breathing, clear to auscultation bilaterally with no crackles, wheezes, or rhonchi MUSCULOSKELETAL: 2+ pitting edema in bilateral lower extremities to knee Neuro: Grossly normal, moves all extremities Psych: Normal affect and thought content  Worth HERO. Kennyth, MD 01/17/2024 2:56 PM

## 2024-01-13 NOTE — Patient Instructions (Signed)
 It was very nice to see you today!  VISIT SUMMARY: During your visit, we discussed your leg swelling, knee pain, diabetes management, and upcoming dental work. We made some changes to your medications and planned follow-up tests to monitor your condition.  YOUR PLAN: PERIPHERAL EDEMA: Swelling in your legs, likely due to your diabetes medication. -Stop taking pioglitazone . -We will do blood work and re-evaluate in two weeks.  TYPE 2 DIABETES MELLITUS WITH DIABETIC NEUROPATHY: Your diabetes is complicated by nerve damage, which affects your ability to feel pain in your legs. -Stop taking pioglitazone  due to suspected swelling. -Continue using Voltaren gel for pain relief. -We will check your A1c level and monitor your blood glucose after stopping pioglitazone .  CHRONIC KIDNEY DISEASE, STAGE 3: You have stage 3 chronic kidney disease, which we are managing with medication. -Continue taking torsemide  as prescribed. -We will recheck your kidney function with blood work.  DENTAL DISEASE WITH PLANNED EXTRACTIONS AND PROSTHETICS: You need dental extractions and prosthetics due to broken teeth. -Proceed with dental extractions and prosthetics. -Anesthesia is approved for your dental procedures.  Return in about 2 weeks (around 01/27/2024) for Follow Up.   Take care, Dr Kennyth  PLEASE NOTE:  If you had any lab tests, please let us  know if you have not heard back within a few days. You may see your results on mychart before we have a chance to review them but we will give you a call once they are reviewed by us .   If we ordered any referrals today, please let us  know if you have not heard from their office within the next week.   If you had any urgent prescriptions sent in today, please check with the pharmacy within an hour of our visit to make sure the prescription was transmitted appropriately.   Please try these tips to maintain a healthy lifestyle:  Eat at least 3 REAL meals and 1-2  snacks per day.  Aim for no more than 5 hours between eating.  If you eat breakfast, please do so within one hour of getting up.   Each meal should contain half fruits/vegetables, one quarter protein, and one quarter carbs (no bigger than a computer mouse)  Cut down on sweet beverages. This includes juice, soda, and sweet tea.   Drink at least 1 glass of water with each meal and aim for at least 8 glasses per day  Exercise at least 150 minutes every week.

## 2024-01-13 NOTE — Assessment & Plan Note (Signed)
Blood pressure at goal today on lisinopril 20 mg daily.

## 2024-01-13 NOTE — Telephone Encounter (Signed)
 [employer] form faxed to [phone] on 01/12/2024 Form placed to be scan in patient chart

## 2024-01-13 NOTE — Assessment & Plan Note (Signed)
 Check A1c with labs.  We are stopping her Actos  as above due to her lower extremity edema.  She will continue Januvia  25 mg daily.  Depending on results of A1c may need to try switching to GLP agonist.

## 2024-01-13 NOTE — Assessment & Plan Note (Signed)
 She is on Lipitor 10 mg daily.  Check labs.

## 2024-01-17 ENCOUNTER — Ambulatory Visit: Payer: Self-pay | Admitting: Family Medicine

## 2024-01-17 NOTE — Progress Notes (Signed)
 Labs are all stable.  Do not need to make any changes to treatment plan.  We should recheck everything again in 6 months.

## 2024-01-18 ENCOUNTER — Encounter (HOSPITAL_COMMUNITY)
Admission: RE | Admit: 2024-01-18 | Discharge: 2024-01-18 | Disposition: A | Discharge status: Home / Self Care | Source: Ambulatory Visit | Attending: Nephrology | Admitting: Nephrology

## 2024-01-18 VITALS — BP 108/58 | HR 55 | Temp 97.4°F | Resp 16

## 2024-01-18 DIAGNOSIS — N184 Chronic kidney disease, stage 4 (severe): Secondary | ICD-10-CM | POA: Insufficient documentation

## 2024-01-18 DIAGNOSIS — D631 Anemia in chronic kidney disease: Secondary | ICD-10-CM | POA: Insufficient documentation

## 2024-01-18 LAB — POCT HEMOGLOBIN-HEMACUE: Hemoglobin: 10.4 g/dL — ABNORMAL LOW (ref 12.0–15.0)

## 2024-01-18 LAB — IRON AND TIBC
Iron: 74 ug/dL (ref 28–170)
Saturation Ratios: 24 % (ref 10.4–31.8)
TIBC: 314 ug/dL (ref 250–450)
UIBC: 240 ug/dL

## 2024-01-18 LAB — FERRITIN: Ferritin: 91 ng/mL (ref 11–307)

## 2024-01-18 MED ORDER — EPOETIN ALFA-EPBX 10000 UNIT/ML IJ SOLN
10000.0000 [IU] | Freq: Once | INTRAMUSCULAR | Status: AC
  Administered 2024-01-18: 10000 [IU] via SUBCUTANEOUS

## 2024-01-18 MED ORDER — EPOETIN ALFA-EPBX 10000 UNIT/ML IJ SOLN
INTRAMUSCULAR | Status: AC
  Filled 2024-01-18: qty 1

## 2024-01-19 ENCOUNTER — Other Ambulatory Visit: Payer: Self-pay

## 2024-01-19 NOTE — Patient Outreach (Signed)
 Social Drivers of Health  Community Resource and Care Coordination Visit Note   01/19/2024  Name: Brandi Cooper MRN: [id number] DOB:[birth year]  Situation: Referral received for [facility] needs assessment and assistance related to Transportation. I obtained verbal consent from Patient.  Visit completed with Patient on the phone.   Background:      Assessment:   Goals Addressed             This Visit's Progress    BSW Goals       Current SDOH Barriers:  Transportation  Interventions: Referred patient to community resources  Patient will await approval from SCAT for transportation services          Recommendation:   Patient will await follow up from SCAT on approval  Follow Up Plan:   Telephone follow up appointment date/time:  02/08/24 at 10am  Tillman Gardener, BSW [place]  [facility], [facility] Social Worker Direct Dial: [phone]  Fax: [phone] Website: [email or web address]

## 2024-01-19 NOTE — Patient Instructions (Signed)
 Visit Information  Thank you for taking time to visit with me today. Please don't hesitate to contact me if I can be of assistance to you before our next scheduled appointment.  Your next care management appointment is by telephone on 02/08/24 at 10am   Please call the care guide team at [phone] if you need to cancel, schedule, or reschedule an appointment.   Please call 911 if you are experiencing a Mental Health or Behavioral Health Crisis or need someone to talk to.  Brandi Cooper, BSW [name]  [facility], [facility] Social Worker Direct Dial: [phone]  Fax: [phone] Website: [email or web address]

## 2024-01-25 ENCOUNTER — Encounter (HOSPITAL_COMMUNITY)

## 2024-01-26 ENCOUNTER — Encounter: Payer: Self-pay | Admitting: Family Medicine

## 2024-01-26 ENCOUNTER — Ambulatory Visit (INDEPENDENT_AMBULATORY_CARE_PROVIDER_SITE_OTHER): Admitting: Family Medicine

## 2024-01-26 VITALS — BP 110/70 | HR 62 | Temp 97.3°F | Ht 65.0 in | Wt 181.6 lb

## 2024-01-26 DIAGNOSIS — E1122 Type 2 diabetes mellitus with diabetic chronic kidney disease: Secondary | ICD-10-CM

## 2024-01-26 DIAGNOSIS — M1990 Unspecified osteoarthritis, unspecified site: Secondary | ICD-10-CM | POA: Diagnosis not present

## 2024-01-26 DIAGNOSIS — Z7984 Long term (current) use of oral hypoglycemic drugs: Secondary | ICD-10-CM

## 2024-01-26 DIAGNOSIS — N184 Chronic kidney disease, stage 4 (severe): Secondary | ICD-10-CM

## 2024-01-26 NOTE — Assessment & Plan Note (Signed)
 GFR stable at 26 on recent labs.  Continue management per nephrology.

## 2024-01-26 NOTE — Progress Notes (Signed)
   Brandi Cooper is a 78 y.o. female who presents today for an office visit.  Assessment/Plan:  New/Acute Problems: Foot Pain / Bunion Patient with bilateral bunion and bilateral foot pain.  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  Will place referral to podiatry for further evaluation.  Leg Edema  Multifactorial though improving since stopping Actos  2 weeks ago.  She is down about 8 pounds.  We will take Actos  off medication list.   Chronic Problems Addressed Today: Type 2 diabetes mellitus with stage 4 chronic kidney disease (HCC) Last A1c at goal.  She is on Januvia  25 mg daily.  We discontinued Actos .  Recheck A1c in 3 to 6 months.  CKD (chronic kidney disease) stage 4, GFR 15-29 ml/min (HCC) GFR stable at 26 on recent labs.  Continue management per nephrology.     Subjective:  HPI:  See assessment / plan for status of chronic conditions.  Patient is here today for follow-up.  I saw her 2 weeks ago for a leg edema.  At that time we discontinued her Actos .     Discussed the use of AI scribe software for clinical note transcription with the patient, who gave verbal consent to proceed.  History of Present Illness Yashika Mask is a 78 year old female who presents with leg edema.  Two weeks ago, her Actos  was discontinued, resulting in a weight loss of approximately eight pounds, which she attributes to water loss. Despite this, her legs remain slightly edematous.  She experiences pain in her ankles, particularly the right one, and feels like she is losing her arch, which is painful. She has neuropathy in both feet, with the left foot swelling more and the right foot experiencing more pain. She also has difficulty finding shoes that fit due to bunions on both feet, which sometimes cause sharp pains or a continual ache.  Her blood sugar levels have been stable, with a recent A1c of 6.5. She has been off Actos  since the last visit, and her blood sugar  remains well-controlled without it.          Objective:  Physical Exam: BP 110/70   Pulse 62   Temp (!) 97.3 F (36.3 C) (Temporal)   Ht 5' 5 (1.651 m)   Wt 181 lb 9.6 oz (82.4 kg)   SpO2 99%   BMI 30.22 kg/m   Wt Readings from Last 3 Encounters:  01/26/24 181 lb 9.6 oz (82.4 kg)  01/13/24 189 lb (85.7 kg)  12/21/23 180 lb (81.6 kg)    Gen: No acute distress, resting comfortably MUSCULOSKELETAL: Mild pitting edema noted in bilateral lower extremities improved from previous exam.  Bilateral bunions noted Neuro: Grossly normal, moves all extremities Psych: Normal affect and thought content      Chany Woolworth M. Kennyth, MD 01/26/2024 10:55 AM

## 2024-01-26 NOTE — Patient Instructions (Signed)
 It was very nice to see you today!  VISIT SUMMARY: Today, we discussed your leg swelling, foot pain, and diabetes management. Your leg swelling has improved since stopping Actos , and your blood sugar levels remain stable. We also talked about your foot pain and bunions, and you will be seeing a podiatrist for further evaluation and management.  YOUR PLAN: LOWER EXTREMITY EDEMA: Your leg swelling has improved with an 8-pound weight loss after stopping Actos , but you still have some swelling and pain in your ankles. -Monitor your leg swelling and weight changes. -You are referred to a podiatrist for further evaluation of your ankle pain and swelling.  TYPE 2 DIABETES MELLITUS WITH DIABETIC NEUROPATHY AND CHRONIC KIDNEY DISEASE: Your diabetes is well-controlled with a recent A1c of 6.5%. -Continue managing your diabetes without Actos . -Recheck your blood sugar levels in 3 to 6 months.  BILATERAL FOOT AND ANKLE PAIN WITH NEUROPATHY, BUNIONS, AND OSTEOARTHRITIS: You have pain in your feet and ankles due to neuropathy, bunions, and arthritis, which makes finding comfortable shoes difficult. -You are referred to a podiatrist for pain management, bunions, and arthritis. -Discuss with the podiatrist about footwear options and arch support.  Return in about 6 months (around 07/25/2024) for Follow Up.   Take care, Dr Kennyth  PLEASE NOTE:  If you had any lab tests, please let us  know if you have not heard back within a few days. You may see your results on mychart before we have a chance to review them but we will give you a call once they are reviewed by us .   If we ordered any referrals today, please let us  know if you have not heard from their office within the next week.   If you had any urgent prescriptions sent in today, please check with the pharmacy within an hour of our visit to make sure the prescription was transmitted appropriately.   Please try these tips to maintain a healthy  lifestyle:  Eat at least 3 REAL meals and 1-2 snacks per day.  Aim for no more than 5 hours between eating.  If you eat breakfast, please do so within one hour of getting up.   Each meal should contain half fruits/vegetables, one quarter protein, and one quarter carbs (no bigger than a computer mouse)  Cut down on sweet beverages. This includes juice, soda, and sweet tea.   Drink at least 1 glass of water with each meal and aim for at least 8 glasses per day  Exercise at least 150 minutes every week.

## 2024-01-26 NOTE — Assessment & Plan Note (Signed)
 Last A1c at goal.  She is on Januvia  25 mg daily.  We discontinued Actos .  Recheck A1c in 3 to 6 months.

## 2024-01-27 ENCOUNTER — Telehealth (HOSPITAL_COMMUNITY): Payer: Self-pay | Admitting: Pharmacy Technician

## 2024-01-27 ENCOUNTER — Ambulatory Visit: Admitting: Family Medicine

## 2024-01-27 NOTE — Telephone Encounter (Signed)
 Auth Submission: DENIED Site of care: [facility] Payer: [employer] [employer] [place] Medication & CPT/J Code(s) submitted: Q5106 - PR INJ RETACRIT  NON-ESRD USE Diagnosis Code: N18.4, D63.1 Route of submission (phone, fax, portal): phone Phone # [phone] opt 5 Fax # [phone] Auth type: Buy/Bill HB Units/visits requested: 10000 units every 2 weeks Reference number: [id number]  Received a fax regarding Prior Authorization from [facility] for RETACRIT . Authorization has been DENIED because hemaglobin was not low enough for criteria to be met. I faxed the denial to Brandi Cooper (Dr Dolan) to make them aware and the option to appeal.    Brandi Cooper, CPhT [facility] Phone: [phone] 01/27/2024

## 2024-02-01 ENCOUNTER — Encounter (HOSPITAL_COMMUNITY)

## 2024-02-07 ENCOUNTER — Other Ambulatory Visit: Payer: Self-pay | Admitting: *Deleted

## 2024-02-07 ENCOUNTER — Other Ambulatory Visit: Payer: Self-pay | Admitting: Family Medicine

## 2024-02-07 MED ORDER — DICYCLOMINE HCL 10 MG PO CAPS: 10.0000 mg | ORAL_CAPSULE | Freq: Two times a day (BID) | ORAL | 3 refills | Status: AC

## 2024-02-08 ENCOUNTER — Other Ambulatory Visit: Payer: Self-pay

## 2024-02-08 NOTE — Patient Outreach (Signed)
 Social Drivers of Health  Community Resource and Care Coordination Visit Note   02/08/2024  Name: Brandi Cooper MRN: [id number] DOB:07-17-1945  Situation: Referral received for [facility] needs assessment and assistance related to Transportation. I obtained verbal consent from Patient.  Visit completed with Patient on the phone.   Background:   SDOH Interventions Today    Flowsheet Row Most Recent Value  SDOH Interventions   Transportation Interventions Other (Comment)  [SCAT application was returned due to missing signature.  Returned and waiting for reply]     Assessment:   Goals Addressed             This Visit's Progress    BSW Goals       Current SDOH Barriers:  Transportation  Interventions: Referred patient to community resources  Patient will await approval from SCAT for transportation services Patient resubmitted application and updated the form with her signature          Recommendation:   Patient will await update from SCAT on application  Follow Up Plan:   Telephone follow up appointment date/time:  02/24/24 at 10:30am  Tillman Gardener, BSW [place]  [facility], [facility] Social Worker Direct Dial: [phone]  Fax: [phone] Website: [email or web address]

## 2024-02-08 NOTE — Patient Instructions (Signed)
 Visit Information  Thank you for taking time to visit with me today. Please don't hesitate to contact me if I can be of assistance to you before our next scheduled appointment.  Your next care management appointment is by telephone on 02/24/24 at 10:30am   Please call the care guide team at [phone] if you need to cancel, schedule, or reschedule an appointment.   Please call 911 if you are experiencing a Mental Health or Behavioral Health Crisis or need someone to talk to.  Tillman Gardener, BSW [place]  [facility], [facility] Social Worker Direct Dial: [phone]  Fax: [phone] Website: [email or web address]

## 2024-02-16 ENCOUNTER — Other Ambulatory Visit: Payer: Self-pay | Admitting: Family Medicine

## 2024-02-17 NOTE — Telephone Encounter (Signed)
 MD office submitted appeal to overturn denial. Insurance overturned denial and Retacrit  is now approved. Approval letter scanned into the media tab.   Auth Submission: APPROVED Site of care: [facility] Payer: [employer] [employer] Medication & CPT/J Code(s) submitted: Q5106 - PR INJ RETACRIT  NON-ESRD USE Diagnosis Code: N18.4, D63.1 Route of submission (phone, fax, portal):  Phone #  Fax #  Auth type: Buy/Bill HB Units/visits requested: 10000 units every 2 weeks Auth #: [id number] CASE: [id number] Approval dates: 01/27/24 to 08/06/24  Dagoberto Armour, CPhT [facility] Phone: [phone] 02/17/2024

## 2024-02-24 ENCOUNTER — Other Ambulatory Visit: Payer: Self-pay

## 2024-02-24 NOTE — Patient Instructions (Signed)
 Visit Information  Thank you for taking time to visit with me today. Please don't hesitate to contact me if I can be of assistance to you before our next scheduled appointment.  Your next care management appointment is by telephone on 03/01/24 at 1pm   Please call the care guide team at [phone] if you need to cancel, schedule, or reschedule an appointment.   Please call 911 if you are experiencing a Mental Health or Behavioral Health Crisis or need someone to talk to.  Tillman Gardener, BSW [place]  [facility], [facility], [facility] Social Worker Direct Dial: [phone]  Fax: [phone] Website: [email or web address]

## 2024-02-24 NOTE — Patient Outreach (Signed)
 Social Drivers of Health  Community Resource and Care Coordination Visit Note   02/24/2024  Name: Brandi Cooper MRN: [id number] DOB:10-01-1945  Situation: Referral received for [facility] needs assessment and assistance related to Transportation. I obtained verbal consent from Patient.  Visit completed with Patient on the phone.   Background:   SDOH Interventions Today    Flowsheet Row Most Recent Value  SDOH Interventions   Transportation Interventions Other (Comment)  [Pt received new application from SCAT.SW contacted SCAT and informed pt approved and forms were an error.]     Assessment:   Goals Addressed             This Visit's Progress    BSW Goals       Current SDOH Barriers:  Transportation  Interventions: Referred patient to community resources  Patient will await approval from SCAT for transportation services SCAT confirms eligible but will await approval packet          Recommendation:   Await approval packet and SW will contact RN for follow up visit.  Follow Up Plan:   Telephone follow up appointment date/time:  03/01/24 at 11am  Tillman Gardener, BSW [place]  [facility], [facility] Social Worker Direct Dial: [phone]  Fax: [phone] Website: [email or web address]

## 2024-02-27 ENCOUNTER — Ambulatory Visit: Admitting: Family Medicine

## 2024-02-27 ENCOUNTER — Encounter: Payer: Self-pay | Admitting: Family Medicine

## 2024-02-27 VITALS — BP 110/60 | HR 69 | Temp 97.7°F | Resp 98 | Ht 65.0 in | Wt 176.6 lb

## 2024-02-27 DIAGNOSIS — M51369 Other intervertebral disc degeneration, lumbar region without mention of lumbar back pain or lower extremity pain: Secondary | ICD-10-CM

## 2024-02-27 DIAGNOSIS — I152 Hypertension secondary to endocrine disorders: Secondary | ICD-10-CM

## 2024-02-27 DIAGNOSIS — R5381 Other malaise: Secondary | ICD-10-CM | POA: Insufficient documentation

## 2024-02-27 NOTE — Assessment & Plan Note (Signed)
 Following with orthopedics and sports medicine as above.  Overall symptoms are stable.

## 2024-02-27 NOTE — Assessment & Plan Note (Signed)
 Patient has been following with sports medicine and orthopedics for decreased mobility and debility related to her orthopedic conditions including lumbar spondylosis.  Her orthopedic wrote a prescription for electric wheelchair a year ago however she had difficulty with getting this filled.  Will give another order today and send this to the medical supply company.  She will continue working with sports medicine and her orthopedic as previous.

## 2024-02-27 NOTE — Progress Notes (Signed)
° °  Brandi Cooper is a 78 y.o. female who presents today for an office visit.  Assessment/Plan:  Chronic Problems Addressed Today: Debility Patient has been following with sports medicine and orthopedics for decreased mobility and debility related to her orthopedic conditions including lumbar spondylosis.  Her orthopedic wrote a prescription for electric wheelchair a year ago however she had difficulty with getting this filled.  Will give another order today and send this to the medical supply company.  She will continue working with sports medicine and her orthopedic as previous.  Lumbar degenerative disc disease Following with orthopedics and sports medicine as above.  Overall symptoms are stable.  Hypertension associated with diabetes (HCC) Blood pressure at goal today on lisinopril  20 mg daily.     Subjective:  HPI:  See assessment / plan for status of chronic conditions.   Discussed the use of AI scribe software for clinical note transcription with the patient, who gave verbal consent to proceed.  History of Present Illness Brandi Cooper is a 79 year old female who presents with issues obtaining an electric wheelchair.  She has been experiencing difficulties in obtaining an electric wheelchair, which was prescribed to her over a year ago. She has encountered numerous obstacles, including confusion about the process and who is responsible for processing the necessary paperwork. She was informed by transportation services and Medicare representatives that her primary care provider should handle the paperwork for the electric wheelchair.  She has recently resolved her transportation issues with the [employer], which now allows her to use [place] for her appointments, alleviating the need to rely on her son-in-law for transportation. This has been a significant relief for her, as she does not want to burden her family with her mobility needs.  She has considered using  a scooter but finds it challenging due to the need to lean forward to maneuver the steering column, which is not feasible for her. She prefers an mining engineer wheelchair to maintain her independence and reduce the physical strain on her family when assisting her with mobility.         Objective:  Physical Exam: BP 110/60   Pulse 69   Temp 97.7 F (36.5 C) (Temporal)   Resp (!) 98   Ht 5' 5 (1.651 m)   Wt 176 lb 9.6 oz (80.1 kg)   BMI 29.39 kg/m   Gen: No acute distress, resting comfortably Neuro: Grossly normal, moves all extremities Psych: Normal affect and thought content      Brandi Pixler M. Kennyth, MD 02/27/2024 9:53 AM

## 2024-02-27 NOTE — Assessment & Plan Note (Signed)
Blood pressure at goal today on lisinopril 20 mg daily.

## 2024-02-27 NOTE — Patient Instructions (Signed)
 It was very nice to see you today!  VISIT SUMMARY: During your visit, we discussed the challenges you have faced in obtaining an electric wheelchair and addressed the necessary steps to resolve these issues.  YOUR PLAN: DEBILITY: You have chronic debility and need assistance with mobility. An electric wheelchair has been prescribed to help with this. -We have sent the order for the electric wheelchair to the medical supply company and coordinated the necessary paperwork and documentation. -Your transportation arrangements for appointments have been ensured.  Return if symptoms worsen or fail to improve.   Take care, Dr Kennyth  PLEASE NOTE:  If you had any lab tests, please let us  know if you have not heard back within a few days. You may see your results on mychart before we have a chance to review them but we will give you a call once they are reviewed by us .   If we ordered any referrals today, please let us  know if you have not heard from their office within the next week.   If you had any urgent prescriptions sent in today, please check with the pharmacy within an hour of our visit to make sure the prescription was transmitted appropriately.   Please try these tips to maintain a healthy lifestyle:  Eat at least 3 REAL meals and 1-2 snacks per day.  Aim for no more than 5 hours between eating.  If you eat breakfast, please do so within one hour of getting up.   Each meal should contain half fruits/vegetables, one quarter protein, and one quarter carbs (no bigger than a computer mouse)  Cut down on sweet beverages. This includes juice, soda, and sweet tea.   Drink at least 1 glass of water with each meal and aim for at least 8 glasses per day  Exercise at least 150 minutes every week.

## 2024-02-28 ENCOUNTER — Other Ambulatory Visit: Payer: Self-pay | Admitting: Orthopedic Surgery

## 2024-02-29 ENCOUNTER — Inpatient Hospital Stay (HOSPITAL_COMMUNITY): Admission: RE | Admit: 2024-02-29 | Discharge: 2024-02-29 | Attending: Nephrology

## 2024-02-29 VITALS — BP 117/55 | Temp 97.2°F | Resp 16

## 2024-02-29 DIAGNOSIS — D631 Anemia in chronic kidney disease: Secondary | ICD-10-CM | POA: Insufficient documentation

## 2024-02-29 DIAGNOSIS — N184 Chronic kidney disease, stage 4 (severe): Secondary | ICD-10-CM | POA: Insufficient documentation

## 2024-02-29 LAB — IRON AND TIBC
Iron: 62 ug/dL (ref 28–170)
Saturation Ratios: 23 % (ref 10.4–31.8)
TIBC: 272 ug/dL (ref 250–450)
UIBC: 210 ug/dL

## 2024-02-29 LAB — POCT HEMOGLOBIN-HEMACUE: Hemoglobin: 11.5 g/dL — ABNORMAL LOW (ref 12.0–15.0)

## 2024-02-29 LAB — FERRITIN: Ferritin: 217 ng/mL (ref 11–307)

## 2024-02-29 MED ORDER — EPOETIN ALFA-EPBX 10000 UNIT/ML IJ SOLN
10000.0000 [IU] | Freq: Once | INTRAMUSCULAR | Status: AC
  Administered 2024-02-29: 09:00:00 10000 [IU] via SUBCUTANEOUS

## 2024-02-29 MED ORDER — EPOETIN ALFA-EPBX 10000 UNIT/ML IJ SOLN
INTRAMUSCULAR | Status: AC
  Filled 2024-02-29: qty 1

## 2024-03-01 ENCOUNTER — Other Ambulatory Visit: Payer: Self-pay

## 2024-03-01 NOTE — Patient Instructions (Signed)
 Visit Information  Thank you for taking time to visit with me today. Please don't hesitate to contact me if I can be of assistance to you before our next scheduled appointment.   Please call the care guide team at [phone] if you need to cancel, schedule, or reschedule an appointment.   Please call 911 if you are experiencing a Mental Health or Behavioral Health Crisis or need someone to talk to.  Brandi Cooper, BSW [place]  [facility], [facility] Social Worker Direct Dial: [phone]  Fax: [phone] Website: [email or web address]

## 2024-03-01 NOTE — Patient Outreach (Signed)
 Social Drivers of Health  Community Resource and Care Coordination Visit Note   03/01/2024  Name: Brandi Cooper MRN: [id number] DOB:03-12-1946  Situation: Referral received for [facility] needs assessment and assistance related to Transportation. I obtained verbal consent from Patient.  Visit completed with Patient on the phone.   Background:   SDOH Interventions Today    Flowsheet Row Most Recent Value  SDOH Interventions   Food Insecurity Interventions Intervention Not Indicated  [Declined food banks]  Housing Interventions Intervention Not Indicated  Transportation Interventions [employer]AT ([employer])  [SCAT approved 12/26]  Utilities Interventions Intervention Not Indicated  Financial Strain Interventions Intervention Not Indicated     Assessment:   Goals Addressed             This Visit's Progress    COMPLETED: BSW Goals       Current SDOH Barriers:  Transportation  Interventions: Referred patient to community resources  Patient has been approved for SCAT for transportation services SW reviewed instruction packet and advised patient to call for 03/14/24 appointment asap. Patient reports provider started paperwork for wheelchair. Patient reports no other needs at this time.          Recommendation:   call for transportation assistance at least one week before appointments  Follow Up Plan:   Patient has achieved all patient stated goals. [employer] will be closed. Patient has been provided contact information should new needs arise.   Tillman Gardener, BSW [place]  [facility], [facility] Social Worker Direct Dial: [phone]  Fax: [phone] Website: [email or web address]

## 2024-03-14 ENCOUNTER — Encounter (HOSPITAL_COMMUNITY)
Admission: RE | Admit: 2024-03-14 | Discharge: 2024-03-14 | Disposition: A | Discharge status: Home / Self Care | Source: Ambulatory Visit | Attending: Nephrology | Admitting: Nephrology

## 2024-03-14 VITALS — BP 91/64 | HR 72 | Temp 97.1°F | Resp 16

## 2024-03-14 DIAGNOSIS — N184 Chronic kidney disease, stage 4 (severe): Secondary | ICD-10-CM | POA: Diagnosis not present

## 2024-03-14 DIAGNOSIS — D631 Anemia in chronic kidney disease: Secondary | ICD-10-CM

## 2024-03-14 LAB — POCT HEMOGLOBIN-HEMACUE: Hemoglobin: 11.6 g/dL — ABNORMAL LOW (ref 12.0–15.0)

## 2024-03-14 MED ORDER — EPOETIN ALFA-EPBX 10000 UNIT/ML IJ SOLN
10000.0000 [IU] | Freq: Once | INTRAMUSCULAR | Status: AC
  Administered 2024-03-14: 10000 [IU] via SUBCUTANEOUS

## 2024-03-14 MED ORDER — EPOETIN ALFA-EPBX 10000 UNIT/ML IJ SOLN
INTRAMUSCULAR | Status: AC
  Filled 2024-03-14: qty 1

## 2024-03-28 ENCOUNTER — Encounter (HOSPITAL_COMMUNITY)
Admission: RE | Admit: 2024-03-28 | Discharge: 2024-03-28 | Disposition: A | Discharge status: Home / Self Care | Source: Ambulatory Visit | Attending: Nephrology | Admitting: Nephrology

## 2024-03-28 ENCOUNTER — Ambulatory Visit

## 2024-03-28 VITALS — BP 87/55 | HR 69 | Temp 97.5°F | Resp 16

## 2024-03-28 DIAGNOSIS — D631 Anemia in chronic kidney disease: Secondary | ICD-10-CM | POA: Insufficient documentation

## 2024-03-28 DIAGNOSIS — N184 Chronic kidney disease, stage 4 (severe): Secondary | ICD-10-CM | POA: Insufficient documentation

## 2024-03-28 LAB — IRON AND TIBC
Iron: 55 ug/dL (ref 28–170)
Saturation Ratios: 21 % (ref 10.4–31.8)
TIBC: 266 ug/dL (ref 250–450)
UIBC: 211 ug/dL

## 2024-03-28 LAB — POCT HEMOGLOBIN-HEMACUE: Hemoglobin: 11.5 g/dL — ABNORMAL LOW (ref 12.0–15.0)

## 2024-03-28 LAB — FERRITIN: Ferritin: 239 ng/mL (ref 11–307)

## 2024-03-28 MED ORDER — EPOETIN ALFA-EPBX 10000 UNIT/ML IJ SOLN
INTRAMUSCULAR | Status: AC
  Filled 2024-03-28: qty 1

## 2024-03-28 MED ORDER — EPOETIN ALFA-EPBX 10000 UNIT/ML IJ SOLN
10000.0000 [IU] | Freq: Once | INTRAMUSCULAR | Status: AC
  Administered 2024-03-28: 10000 [IU] via SUBCUTANEOUS

## 2024-04-02 ENCOUNTER — Other Ambulatory Visit: Payer: Self-pay | Admitting: Orthopedic Surgery

## 2024-04-09 ENCOUNTER — Other Ambulatory Visit (HOSPITAL_COMMUNITY): Payer: Self-pay | Admitting: Nephrology

## 2024-04-09 NOTE — Progress Notes (Signed)
 Received renewal of Retacrit  orders  Dose: 10000 units every 2 weeks  Labs: POCT Hgb each visit; iron panel and ferritin every 4 weeks  Sherry Pennant, PharmD, MPH, BCPS, CPP Clinical Pharmacist

## 2024-04-10 ENCOUNTER — Ambulatory Visit: Attending: Family Medicine

## 2024-04-10 ENCOUNTER — Other Ambulatory Visit: Payer: Self-pay | Admitting: *Deleted

## 2024-04-10 DIAGNOSIS — M6281 Muscle weakness (generalized): Secondary | ICD-10-CM | POA: Diagnosis present

## 2024-04-10 DIAGNOSIS — R6889 Other general symptoms and signs: Secondary | ICD-10-CM

## 2024-04-10 DIAGNOSIS — R2689 Other abnormalities of gait and mobility: Secondary | ICD-10-CM | POA: Diagnosis present

## 2024-04-10 NOTE — Therapy (Signed)
 "  OUTPATIENT PHYSICAL THERAPY WHEELCHAIR EVALUATION   Patient Name: Brandi Cooper MRN: [id number] DOB:Jul 26, 1945, 79 y.o., female Today's Date: 04/10/2024  END OF SESSION:  PT End of Session - 04/10/24 1036     Visit Number 1    Number of Visits 1    PT Start Time 1035    PT Stop Time 1115    PT Time Calculation (min) 40 min    Equipment Utilized During Treatment Other (comment)    Activity Tolerance Patient tolerated treatment well    Behavior During Therapy WFL for tasks assessed/performed          Past Medical History:  Diagnosis Date   Chronic kidney disease    Diabetes mellitus without complication (HCC)    GERD (gastroesophageal reflux disease)    Glaucoma    Heart disease    Past Surgical History:  Procedure Laterality Date   ABDOMINAL HYSTERECTOMY     blood transfusion     BREAST SURGERY     Breast Reduction   CERVICAL FUSION     EYE SURGERY     KNEE CARTILAGE SURGERY     LUMBAR DISC SURGERY     REDUCTION MAMMAPLASTY     RETINOPATHY SURGERY     SHOULDER SURGERY Bilateral    Rotator Cuff   TRABECULECTOMY     TUBAL LIGATION     VITRECTOMY AND CATARACT     WRIST SURGERY     Carpal Tunnel   Patient Active Problem List   Diagnosis Date Noted   Debility 02/27/2024   Dyslipidemia due to type 2 diabetes mellitus (HCC) 01/13/2024   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 Worth Kitty  REFERRING PROVIDER: Kitty Worth HERO, MD  THERAPY DIAG:  Other abnormalities of gait and mobility  Muscle weakness (generalized)  Rationale for Evaluation and Treatment Rehabilitation  SUBJECTIVE:  SUBJECTIVE STATEMENT: Pt presents for wheelchair evaluation. Patient lives with daughter, son in law and 30 yr old grand daughter. Patient has PMH DM, CKD stage 4, HTN, Chronic pain in bil shoulders, chronic neck pain, bil carpal tunnel syndrome, debility, chronic neck pain and back pain, hx of ACDF in cervical spine and hx of lumbar surgery.   PRECAUTIONS: Fall  RED FLAGS: None  WEIGHT BEARING RESTRICTIONS No    OCCUPATION: retired  PLOF:  Needs assistance with ADLs, Needs assistance with homemaking, and Needs assistance with gait  PATIENT GOALS: obtain a power chair for mobility         MEDICAL HISTORY:  Primary diagnosis onset: 03/27/2024     Medical Diagnosis with ICD-10 code: R53.81 (ICD-10-CM) - Other malaise     [] Progressive disease  Relevant future surgeries:     Height: 5' 5 Weight: 176 lbs Explain recent changes or trends in weight:      History:  Past Medical History:  Diagnosis Date   Chronic kidney disease    Diabetes mellitus without complication (HCC)    GERD (gastroesophageal reflux disease)    Glaucoma    Heart disease        Cardio Status:  Functional Limitations:   [] Intact  [x]  Impaired    Pt reports low BP  Respiratory Status:  Functional Limitations:   [x] Intact  [] Impaired   [] SOB [] COPD [] O2 Dependent ______LPM  [] Ventilator Dependent  Resp equip:                                                     Objective Measure(s):   Orthotics:   [] Amputee:                                                             [] Prosthesis:        HOME ENVIRONMENT:   [] House [] Condo/town home [x] Apartment [] Asst living [] LTCF         [] Own  [x] Rent   [] Lives alone [x] Lives with others -      family                       Hours without assistance: 10  [x] Home is accessible to patient                                 Storage of wheelchair:  [x] In home   [] Other Comments:        COMMUNITY :  TRANSPORTATION:  [x] Car [] Producer, Television/film/video [] Adapted w/c Lift []  Ambulance [] Other:                     [] Sits in wheelchair during transport   Where is w/c stored during transport?  [] [employer]s  []  [employer]  r   [] Self-Driver       Drive while in  Biomedical Scientist [] yes [x] no   Employment and/or school:  Specific requirements pertaining to mobility        Other:  COMMUNICATION:  Verbal Communication  [x] WFL [] receptive [] WFL [] expressive [] Understandable  [] Difficult to understand  []   non-communicative  Primary Language:_____English_________ 2nd:_____________  Communication provided by:[x] Patient [] Family [] Caregiver [] Translator   [] Uses an augmentative communication device     Manufacturer/Model :                                                                MOBILITY/BALANCE:  Sitting Balance  Standing Balance  Transfers  Ambulation   [x] WFL      [] WFL  [x] Independent  []  Independent   [] Uses UE for balance in sitting Comments:  [x] Uses UE/device for stability Comments: rolling walker []  Min assist  []  Ambulates independently with       device:___________________      []  Mod assist  []  Able to ambulate _20-30_____ feet        safely/functionally/independently   []  Min assist  []  Min assist  []  Max assist  []  Non-functional ambulator         History/High risk of falls   []  Mod assist  []  Mod assist  []  Dependent  []  Unable to ambulate   []  Max  assist  []  Max assist  Transfer method:[] 1 person [] 2 person [] sliding board [] squat pivot [] stand pivot [] mechanical patient lift  [] other:   []  Unable  []  Unable    Fall History: # of falls in the past 6  months? Multiple falls into wall (2-3x/week) per patient # of near falls in the past 6 months? 1-2 per day    CURRENT SEATING / MOBILITY:  Current Mobility Device: [] None [x] Cane/Walker [] Manual [] Dependent [] Dependent w/ Tilt rScooter  [] Power (type of control):   Manufacturer:  Model:  Serial #:   Size:  Color:  Age:   Purchased by whom:   Current condition of mobility base:    Current seating system:                                                                       Age of seating system:    Describe posture in present seating system:    Is the current mobility meeting medical necessity?:  [] Yes [x] No Describe: limited endurance duet to severe back pain, radicular symptoms in bil LE, limited strength in bil shoulders                                    Ability to complete Mobility-Related Activities of Daily Living (MRADL's) with Current Mobility Device:   Move room to room  [] Independent  [x] Min [] Mod [] Max assist  [] Unable  Comments: requires grab bars and shower seat, has toilet seat with grab bars for asssistance  Meal prep  [] Independent  [] Min [] Mod [x] Max assist  [] Unable    Feeding  [x] Independent  [] Min [] Mod [] Max assist  [] Unable    Bathing  [] Independent  [x] Min [] Mod [] Max assist  [] Unable    Grooming  [x] Independent  [] Min [] Mod [] Max assist  [] Unable    UE dressing  [x] Independent  [] Min [] Mod [] Max assist  [] Unable    LE  dressing  [x] Independent   [] Min [] Mod [] Max assist  [] Unable    Toileting  [x] Independent  [] Min [] Mod [] Max assist  [] Unable    Bowel Mgt: []  Continent []  Incontinent [x]  Accidents []  Diapers []  Colostomy []  Bowel Program:  Bladder Mgt: []  Continent []  Incontinent [x]  Accidents []  Diapers []  Urinal []  Intermittent Cath []  Indwelling Cath []  Supra-pubic Cath     Current Mobility Equipment Trialed/ Ruled Out:    Does not meet mobility needs due to:    Mark all boxes that indicate inability to use the specific equipment listed     Meets needs  for safe  independent functional  ambulation  / mobility    Risk of  Falling or History of Falls    Enviromental limitations      Cognition    Safety concerns with  physical ability    Decreased / limitations endurance  & strength     Decreased / limitations  motor skills  & coordination    Pain    Pace /  Speed    Cardiac and/or  respiratory condition    Contra - indicated by diagnosis   Cane/Crutches  []   [x]   []   []   [x]   [x]   [x]   [x]   [x]   [x]   []    Walker / Rollator  []  NA   []   [x]   []   []   [x]   [x]   [x]   [x]   [x]   [x]   []     Manual Wheelchair [id number]:  [x]  NA  []   []   []   []   []   []   []   []   []   []   []    Manual W/C (K0005) with power assist  [x]  NA  []   []   []   []   []   []   []   []   []   []   []    Scooter  [x]  NA  []   []   []   []   []   []   []   []   []   []   []    Power Wheelchair: standard joystick  []  NA  [x]   []   []   []   []   []   []   []   []   []   []    Power Wheelchair: alternative controls  []  NA  []   []   []   []   []   []   []   []   []   []   []    Summary:  The least costly alternative for independent functional mobility was found to be:    []  Crutch/Cane  []  Walker []  Manual w/c  []  Manual w/c with power assist   []  Scooter   [x]  Power w/c std joystick   []  Power w/c alternative control        []  Requires dependent care mobility device   Cabin Crew for [employer] skills are adequate for safe mobility equipment operation  [x]   Yes []   No  Patient is willing and motivated to use recommended mobility equipment  [x]   Yes []   No       []  Patient is unable to safely operate mobility equipment independently and requires dependent care equipment Comments:           SENSATION and SKIN ISSUES:  Sensation [x]  Intact  []  Impaired []  Absent []  Hyposensate []  Hypersensate  []  Defensiveness  Location(s) of impairment:    Pressure Relief Method(s):  []  Lean side to side to offload (without risk of falling)  []   W/C push up (4+ times/hour for 15+  seconds) [x]  Stand up (without risk of falling)    []  Other: (Describe): Effective pressure relief method(s) above can be performed consistently throughout the day: [] Yes  []  No If not, Why?:  Skin Integrity Risk:       [x]  Low risk           []  Moderate risk            []  High risk  If high risk, explain:   Skin Issues/Skin Integrity  Current skin Issues  []  Yes [x]  No [x]  Intact  []   Red area   []   Open area  []  Scar tissue  []  At risk from prolonged sitting  Where: History of Skin Issues  []  Yes [x]  No Where : When: Stage: Hx of skin flap surgeries  []  Yes [x]  No Where:  When:  Pain: [x]  Yes []  No   Pain Location(s): back pain, bil LE pain, bil shoulders Intensity scale: (0-10) :8-10/10 How does pain interfere with mobility and/or MRADLs? - difficulty with prolonged standing/walking, transfers, reaching overhead, using AD for prolonged periods of time        MAT EVALUATION:  Neuro-Muscular Status: (Tone, Reflexive, Responses, etc.)     [x]   Intact   []  Spasticity:  []  Hypotonicity  []  Fluctuating  []  Muscle Spasms  []  Poor Righting Reactions/Poor Equilibrium Reactions  []  Primal Reflex(s):    Comments:            COMMENTS:    POSTURE:     Comments:  Pelvis Anterior/Posterior:  [x]  Neutral   []  Posterior  []  Anterior  []  Fixed - No movement []  Tendency away from neutral []  Flexible []  Self-correction []  External correction Obliquity (viewed from front)  [x]  WFL []  R Obliquity []  L Obliquity  []  Fixed - No movement []  Tendency away from neutral []  Flexible []  Self-correction []  External correction Rotation  [x]  WFL []  R anterior []  L anterior  []  Fixed - No movement []  Tendency away from neutral []  Flexible []  Self-correction []  External correction Tonal Influence Pelvis:  [x]  Normal []  Flaccid []  Low tone []  Spasticity []  Dystonia []  Pelvis thrust []  Other:    Trunk Anterior/Posterior:  [x]  WFL []  Thoracic kyphosis []   Lumbar lordosis  []  Fixed - No movement []  Tendency away from neutral []  Flexible []  Self-correction []  External correction  [x]  WFL []  Convex to left  []  Convex to right []  S-curve   []  C-curve []  Multiple curves []  Tendency away from neutral []  Flexible []  Self-correction []  External correction Rotation of shoulders and upper trunk:  [x]  Neutral []  Left-anterior []  Right- anterior []  Fixed- no movement []  Tendency away from neutral []  Flexible []  Self correction []  External correction Tonal influence Trunk:  [x]  Normal []  Flaccid []  Low tone []  Spasticity []  Dystonia []  Other:   Head & Neck  [x]  Functional []  Flexed    []  Extended []  Rotated right  []  Rotated left []  Laterally flexed right []  Laterally flexed left []  Cervical hyperextension   [x]  Good head control []  Adequate head control []  Limited head control []  Absent head control Describe tone/movement of head and neck:      Lower Extremity Measurements: LE ROM:  Active ROM Right 04/10/2024 Left 04/10/2024  Hip flexion    Hip extension    Hip abduction    Hip adduction    Knee flexion    Knee extension    Ankle dorsiflexion    Ankle plantarflexion     (  Blank rows = not tested)  LE MMT:  MMT Right 04/10/2024 Left 04/10/2024  Hip flexion 4/5 3+/5  Hip extension    Hip abduction    Hip adduction    Knee flexion 4/5 3+/5  Knee extension 4/5 3+/5  Ankle dorsiflexion    Ankle plantarflexion 4/5 2+/5   (Blank rows = not tested)  Hip positions:  [x]  Neutral   []  Abducted   []  Adducted  []  Subluxed   []  Dislocated   []  Fixed   []  Tendency away from neutral []  Flexible []  Self-correction []  External correction   Hip Windswept:[x]  Neutral  []  Right    []  Left  []  Subluxed   []  Dislocated   []  Fixed   []  Tendency away from neutral []  Flexible []  Self-correction []  External correction  LE Tone: [x]  Normal []  Low tone []  Spasticity []  Flaccid []  Dystonia []  Rocks/Extends at  hip []  Thrust into knee extension []  Pushes legs downward into footrest  Foot positioning: ROM Concerns: Dorsiflexed: []  Right   []  Left Plantar flexed: []  Right    [x]  Left Inversion: []  Right    []  Left Eversion: []  Right    []  Left  LE Edema: []  1+ (Barely detectable impression when finger is pressed into skin) [x]  2+ (slight indentation. 15 seconds to rebound) []  3+ (deeper indentation. 30 seconds to rebound) []  4+ (>30 seconds to rebound)  UE Measurements:  UPPER EXTREMITY ROM:   Active ROM Right 04/10/2024 Left 04/10/2024  Shoulder flexion 120 120  Shoulder abduction 90 90  Shoulder adduction    Elbow flexion    Elbow extension    Wrist flexion    Wrist extension    (Blank rows = not tested)  UPPER EXTREMITY MMT:  MMT Right 04/10/2024 Left 04/10/2024  Shoulder flexion 4/5 4/5  Shoulder abduction 4/5 4/5  Shoulder adduction    Elbow flexion    Elbow extension    Wrist flexion    Wrist extension    Pinch strength    Grip strength    (Blank rows = not tested)  Shoulder Posture:  Right Tendency towards Left  [x]   Functional [x]    []   Elevation []    []   Depression []    []   Protraction []    []   Retraction []    []   Internal rotation []    []   External rotation []    []   Subluxed []     UE Tone: [x]  Normal []  Flaccid []  Low tone []  Spasticity  []  Dystonia []  Other:   UE Edema: [x]  1+ (Barely detectable impression when finger is pressed into skin) []  2+ (slight indentation. 15 seconds to rebound) []  3+ (deeper indentation. 30 seconds to rebound) []  4+ (>30 seconds to rebound)  Wrist/Hand: Handedness: [x]  Right   []  Left   []  NA: Comments:  Right  Left  [x]   WNL [x]    []   Limitations []    []   Contractures []    []   Fisting []    []   Tremors []    []   Weak grasp []    []   Poor dexterity []    []   Hand movement non functional []    []   Paralysis []         MOBILITY BASE RECOMMENDATIONS and JUSTIFICATION:  MOBILITY BASE  JUSTIFICATION    Manufacturer:   Games Developer:         Go Chair med  Color:  Seat Width:  18 Seat Depth 18   []  Manual mobility base (continue below)   []  Scooter/POV  [x]  Power mobility base   Number of hours per day spent in above selected mobility base: 6+  Typical daily mobility base use Schedule: intermittently during the day during awake hours   []  is not a safe, functional ambulator  [x]  limitation prevents from completing a MRADL(s) within a reasonable time frame    [x]  limitation places at high risk of morbidity or mortality secondary to  the attempts to perform a    MRADL(s)  []  limitation prevents accomplishing a MRADL(s) entirely  [x]  provide independent mobility  [x]  equipment is a lifetime medical need  [x]  walker or cane inadequate  [x]  any type manual wheelchair      inadequate  [x]  scooter/POV inadequate      []  requires dependent mobility          MANUAL MOBILITY      []  Standard manual wheelchair  K0001      Arm:    []  both []  right  []  left      Foot:   []  both []  right   []  left  []  self-propels wheelchair  []  will use on regular basis  []  chair fits throughout home  []  willing and motivated to use  []  propels with assistance     []  dependent use   []  Standard hemi-manual wheelchair  K0002      Arm:    []  both []  right  []  left      Foot:   []  both []  right   []  left  []  lower seat height required to foot propel  []  short stature  []  self-propels wheelchair  []  will use on regular basis  []  chair fits throughout home  []  willing and motivated to use   []  propels with assistance  []  dependent use   []  Lightweight manual wheelchair  K0003      Arm:    []  both []  right  []  left      Foot:   []  both  []  right  []  left                   []  hemi height required  []  medical condition and weight of  wheelchair affect ability to self      propel standard manual wheelchair in the residence  []  can and does self-propel (marginal propulsion skills)   []  daily use _________hours  []  chair fits throughout home  []  willing and motivated to use  []  lower seat height required to foot propel  []  short stature   []  High strength lightweight manual  wheelchair (Breezy Ultra 4)  K0004     Arm:    []  both []  right  []  left     Foot:   []  both []  right   []  left                                                                  []  hemi height required []  medical condition and weight of wheelchair affect ability to self propel while engaging in frequent MRADL(s) that cannot be performed in a standard or lightweight manual wheelchair  []   daily use _________hours  []  chair fits throughout home  []  willing and motivated to use  []  prevent repetitive use injuries   []  lower seat height required to foot propel  []  short stature    []  Ultra-lightweight manual wheelchair  K0005     Arm:    []  both []  right  []  left     Foot:   []  both []  right  []  left       []  hemi height required  []  heavy duty    Front seat to floor _____ inches      Rear seat to floor _____ inches      Back height _____ inches     Back angle ______ degrees      Front angle _____ degrees  []   full-time manual wheelchair user  []  Requires individualized fitting and optimal adjustments for multiple features that include adjustable axle configuration, fully adjustable center of gravity, wheel camber, seat and back angle, angle of seat slope, which cannot be accommodated by a K0001 through K0004 manual wheelchair  []  prevent repetitive use injuries  []  daily use_________hours   []  user has high activity patterns that frequently require  them  to go out into the community for the purpose of independently accomplishing high level MRADL activities. Examples of these might include a combination of; shopping, work, school, photographer, childcare, independently loading and unloading from a vehicle etc.  []  lower seat height required to foot propel  []  short stature  []  heavy duty -   weight over 250lbs   []  Current chair is a K0005   manufacture:___________________  model:_________________  serial#____________________  age:_________    []  First time X9994 user (complete trial)  K0004 time and # of strokes to propel 30 feet: ________seconds _________strokes  X9994 time and # of strokes to propel 30 feet: ________seconds _________strokes  What was the result of the trial between the K0004 and K0005 manual wheelchair? ___    What features of the K0005 w/c are needed as compared to the K0004 base? Why?___    []  adjustable seat and back angle changes the angle of seat slope of the frame to attain a gravity assisted position for efficient propulsion and proper weight distribution along the frame     []  the front of the wheelchair will be configured higher than the back of the chair to allow gravity to assist the user with postural stability  []  the center of the wheel will be positioned for stability, safety and efficient propulsion  []  adjustable axle allows for vertical, horizontal, camber and overall width changes  throughout the wheels for adjustment of the client's exact needs and abilities.   []  adjustable axle increases the stability and function of the chair allowing for adjustment of the center of gravity.   []  accommodates the client's anatomical position in the chair maximizing independence in mobility and maneuverability in all environments.   []  create a minimal fixed tilt-in space to assist in positioning.   []  Describe users full-time manual wheelchair activity patterns:___    []  Power assist Comments:  []  prevent repetitive use injuries  []  repetitive strain injury present in    shoulder girdle    []  shoulder pain is (> or =) to 7/10     during manual propulsion       Current Pain _____/10  []  requires conservation of energy to participate in MRADL(s) runable to propel up ramps or curbs using manual wheelchair  []  been  X9994 user greater than one year   []  user unwilling to use power      wheelchair (reason): []  less expensive option to power   wheelchair   []  rim activated power assist -      decreased strength   []  Heavy duty manual wheelchair       K0006     Arm:    []  both []  right  []  left     Foot:   []  both []  right  []  left     []  hemi height required    []  Dependent base  []  user exceeds 250lbs  []  non-functional ambulator    []  extreme spasticity  []  over active movement   []  broken frame/hx of repeated     repairs  []  able to self-propel in residence       []  lower seat to floor height required  []  unable to self-propel in residence   []  Extra heavy duty manual wheelchair  K0007     Arm:    []  both []  right  []  left     Foot:   []  both []  right  []  left     []  hemi height required  []  Dependent base  []  user exceeds 300lbs  []  non-functional ambulator    []  able to self-propel in residence   []  lower seat to floor height required  []  unable to self-propel in residence     []  Manual wheelchair with tilt [phone]      (Manual Tilt-n-Space)  []  patient is dependent for transfers  []  patient requires frequent       positioning for pressure relief   []  patient requires frequent      positioning for poor/absent trunk control        []  Stroller Base  []  infant/child   []  unable to propel manual      wheelchair  []  allows for growth  []  non-functional ambulator  []  non-functional UE  []  independent mobility is not a goal at this time    MANUAL FRAME OPTIONS      Push handles  []  extended   []  angle adjustable   []  standard  []  caregiver access  []  caregiver assist    []  allows hooking to enable      increased ability to perform ADLs or maintain balance   []  Angle Adjustable Back  []  postural control  []  control of tone/spasticity  []  accommodation of range of motion  []  UE functional control  []  accommodation for seating system    Rear wheel placement  []  std/fixed  [] fully adjustableramputee   []  camber  ________degree  []  removable rear wheel  []  non-removable rear wheel  Wheel size _______  Wheel style_______________________  []  improved UE access to wheels  []  increase propulsion ability  []  improved stability  []  changing angle in space for      improvement of postural stability  []  remove for transport    []  allow for seating system to fit on  base  []  amputee placement  []  1-arm drive access   r R  r L  []  enable propulsion of manual       wheelchair with one arm    []  amputee placement   Wheel rims/ Hand rims  []  Standard    []  Specialized-____ []  provide ability to propel manual   []  increase self-propulsion with hand wheelchair weakness/decreased grasp     []  Spoke protector/guard   []   prevent hands from getting caught in spokes   Tires:  []  pneumatic  []  flat free inserts  []  solid  Style:  []  decrease roll resistance              []  prevent frequent flats  []  increase shock absorbency  []  decrease maintenance   []  decrease pain from road shock    []  decrease spasms from road shock    Wheel Locks:    []  push []  pull []  scissor  []  lock wheels for transfers  []  lock wheels from rolling   Brake/wheel lock extension:  []  R  []  L  []  allow user to operate wheel locks due to decreased reach or strength   Caster housing:  Caster size:                      Style:                                          []  suspension fork  []  maneuverability   []  stability of wheelchair   []  durability  []  maintenance  []  angle adjustment for posture  []  allow for feet to come under        wheelchair base  []  allows change in seat to floor  height   []  increase shock absorbency  []  decrease pain from road shock  []  decrease spasms from road    shock   []  Side guards  []  prevent clothing getting caught in wheel or becoming soiled   [] provide hip and pelvic stability  []  eliminates contact between body and wheels  []  limit hand contact with wheels   []  Anti-tippers      []  prevent  wheelchair from tipping    backward  []  assist caregiver with curbs     POWER MOBILITY      []  Scooter/POV    []  can safely operate   []  can safely transfer   []  has adequate trunk stability   []  cannot functionally propel  manual wheelchair    [x]  Power mobility base    []  non-ambulatory   [x]  cannot functionally propel manual wheelchair   [x]  cannot functionally and safely      operate scooter/POV  [x]  can safely operate power       wheelchair  [x]  home is accessible  [x]  willing to use power wheelchair     Tilt  []  Powered tilt on powered chair  []  Powered tilt on manual chair  []  Manual tilt on manual chair Comments:  []  change position for pressure      []  elief/cannot weight shift   []  change position against      gravitational force on head and      shoulders   []  decrease pain  []  blood pressure management   []  control autonomic dysreflexia  []  decrease respiratory distress  []  management of spasticity  []  management of low tone  []  facilitate postural control   []  rest periods   []  control edema  []  increase sitting tolerance   []  aid with transfers     Recline   []  Power recline on power chair  []  Manual recline on manual chair  Comments:    []  intermittent catheterization  []  manage spasticity  []  accommodate femur to back angle  []  change position for pressure relief/cannot weight  shift rhigh risk of pressure sore development  []  tilt alone does not accomplish     effective pressure relief, maximum pressure relief achieved at -      _______ degrees tilt   _______ degrees recline   []  difficult to transfer to and from bed []  rest periods and sleeping in chair  []  repositioning for transfers  []  bring to full recline for ADL care  []  clothing/diaper changes in chair  []  gravity PEG tube feeding  []  head positioning  []  decrease pain  []  blood pressure management   []  control autonomic dysreflexia  []  decrease respiratory distress  []  user on  ventilator     Elevator on mobility base  []  Power wheelchair  []  Scooter  []  increase Indep in transfers   []  increase Indep in ADLs    []  bathroom function and safety  []  kitchen/cooking function and safety  []  shopping  []  raise height for communication at standing level  []  raise height for eye contact which reduces cervical neck strain and pain  []  drive at raised height for safety and navigating crowds  []  Other:   []  Vertical position system  (anterior tilt)     (Drive locks-out)    []  Stand       (Drive enabled)  []  independent weight bearing  []  decrease joint contractures  []  decrease/manage spasticity  []  decrease/manage spasms  []  pressure distribution away from   scapula, sacrum, coccyx, and ischial tuberosity  []  increase digestion and elimination   []  access to counters and cabinets  []  increase reach  []  increase interaction with others at eye level, reduces neck strain  []  increase performance of       MRADL(s)      Power elevating legrest    []  Center mount (Single) 85-170 degrees       []  Standard (Pair) 100-170 degrees  []  position legs at 90 degrees, not available with std power ELR  []  center mount tucks into chair to decrease turning radius in home, not available with std power ELR  []  provide change in position for LE  []  elevate legs during recline    []  maintain placement of feet on      footplate  []  decrease edema  []  improve circulation  []  actuator needed to elevate legrest  []  actuator needed to articulate legrest preventing knees from flexing  []  Increase ground clearance over      curbs  []   STD (pair) independently                     elevate legrest   POWER WHEELCHAIR CONTROLS      Controls/input device  []  Expandable  [x]  Non-expandable  []  Proportional  [x]  Right Hand []  Left Hand  []  Non-proportional/switches/head-array  []  Electrical/proximity         []   Mechanical      Manufacturer:___________________    Type:________________________ [x]  provides access for controlling wheelchair  [x]  programming for accurate control  []  progressive disease/changing condition  []  required for alternative drive      controls       []  lacks motor control to operate  proportional drive control  []  unable to understand proportional controls  []  limited movement/strength  []  extraneous movement / tremors / ataxic / spastic       []  Upgraded electronics controller/harness    []  Single power (tilt or recline)   []  Expandable    []   Non-expandable plus   []  Multi-power (tilt, recline, power legrest, power seat lift, vertical positioning system, stand)  []  allows input device to communicate with drive motors  []  harness provides necessary connections between the controller, input device, and seat functions     []  needed in order to operate power seat functions through joystick/ input device  []  required for alternative drive controls     []  Enhanced display  []  required to connect all alternative drive controls   []  required for upgraded joystick      (lite-throw, heavy duty, micro)  []  Allows user to see in which mode and drive the wheelchair is set; necessary for alternate controls       []  Upgraded tracking electronics  []  correct tracking when on uneven surfaces makes switch driving more efficient and less fatiguing  []  increase safety when driving  []  increase ability to traverse thresholds    []  Safety / reset / mode switches     Type:    []  Used to change modes and stop the wheelchair when driving     [x]  Mount for joystick / input device/switches  [x]  swing away for access or transfers   [x]  attaches joystick / input device / switches to wheelchair   [x]  provides for consistent access  []  midline for optimal placement    []  Attendant controlled joystick plus     mount  []  safety  []  long distance driving  []  operation of seat functions  []  compliance with transportation regulations    [x]   Battery 12 v 20 amp x 2 [x]  required to power (power assist / scooter/ power wc / other):   []  Power inverter (24V to 12V)  []  required for ventilator / respiratory equipment / other:     CHAIR OPTIONS MANUAL & POWER      Armrests   [x]  adjustable height []  removable  []  swing away []  fixed  [x]  flip back  []  reclining  [x]  full length pads []  desk []  tube arms []  gel pads  [x]  provide support with elbow at 90    [x]  remove/flip back/swing away for  transfers  [x]  provide support and positioning of upper body    [x]  allow to come closer to table top  []  remove for access to tables  []  provide support for w/c tray  []  change of height/angles for variable activities   []  Elbow support / Elbow stop  []  keep elbow positioned on arm pad  []  keep arms from falling off arm pad  during tilt and/or recline   Upper Extremity Support  []  Arm trough  []   R  []   L  Style:  []  swivel mount []  fixed mount   []  posterior hand support  []   tray  []  full tray  []  joystick cut out  []   R  []   L  Style:  []  decrease gravitational pull on      shoulders  []  provide support to increase UE  function  []  provide hand support in natural    position  []  position flaccid UE  []  decrease subluxation    []  decrease edema       []  manage spasticity   []  provide midline positioning  []  provide work surface  []  placement for AAC/ Computer/ EADL       Hangers/ Legrests   []  ______ degree  []  Elevating []  articulating  []  swing away []  fixed []  lift off  []  heavy  duty  []  adjustable knee angle  []  adjustable calf panel   []  longer extension tube              []  provide LE support  []  maintain placement of feet on      footplate   []  accommodate lower leg length  []  accommodate to hamstring       tightness  []  enable transfers  []  provide change in position for LE's  []  elevate legs during recline    []  decrease edema  []  durability      Foot support   [x]  footplate []  R []  L [x]  flip up            []  Depth adjustable   []  angle adjustable  []  foot board/one piece    [x]  provide foot support  []  accommodate to ankle ROM  []  allow foot to go under wheelchair base  []  enable transfers     []  Shoe holders  []  position foot    []  decrease / manage spasticity  []  control position of LE  []  stability    []  safety     []  Ankle strap/heel      loops  []  support foot on foot support  []  decrease extraneous movement  []  provide input to heel   []  protect foot     []  Amputee adapter []  R  []  L     Style:                  Size:  []  Provide support for stump/residual extremity    []  Transportation tie-down  []  to provide crash tested tie-down brackets    []  Crutch/cane holder    []  O2 holder    []  IV hanger   []  Ventilator tray/mount    []  stabilize accessory on wheelchair       Component  Justification     [x]  Seat cushion     Captain's seat []  accommodate impaired sensation  []  decubitus ulcers present or history  []  unable to shift weight  []  increase pressure distribution  []  prevent pelvic extension  []  custom required off-the-shelf    seat cushion will not accommodate deformity  [x]  stabilize/promote pelvis alignment  []  stabilize/promote femur alignment  []  accommodate obliquity  []  accommodate multiple deformity  [x]  incontinent/accidents  [x]  low maintenance     []  seat mounts                 []  fixed []  removable  []  attach seat platform/cushion to wheelchair frame    []  Seat wedge    []  provide increased aggressiveness of seat shape to decrease sliding  down in the seat  []  accommodate ROM        []  Cover replacement   []  protect back or seat cushion  []  incontinent/accidents    []  Solid seat / insert    []  support cushion to prevent      hammocking  []  allows attachment of cushion to mobility base    []  Lateral pelvic/thigh/hip     support (Guides)     []  decrease abduction  []  accommodate pelvis  []  position upper legs  []  accommodate spasticity  []   removable for transfers     []  Lateral pelvic/thigh      supports mounts  []  fixed   []  swing-away   []  removable  []  mounts lateral pelvic/thigh supports     []  mounts lateral pelvic/thigh supports swing-away  or removable for transfers    []  Medial thigh support ([place]mel)  [] decrease adduction  [] accommodate ROM  []  remove for transfers   []  alignment      []  Medial thigh   []  fixed      support mounts      []  swing-away   []  removable  []  mounts medial thigh supports   []  Mounts medial supports swing- away or removable for transfers       Component  Justification   [x]  Back    Captain's seat   [x]  provide posterior trunk support []  facilitate tone  []  provide lumbar/sacral support []  accommodate deformity  []  support trunk in midline   []  custom required off-the-shelf back support will not accommodate deformity   []  provide lateral trunk support []  accommodate or decrease tone            []  Back mounts  []  fixed  []  removable  []  attach back rest/cushion to wheelchair frame   []  Lateral trunk      supports  []  R []  L  []  decrease lateral trunk leaning  []  accommodate asymmetry    []  contour for increased contact  []  safety    []  control of tone    []  Lateral trunk      supports mounts  []  fixed  []  swing-away   []  removable  []  mounts lateral trunk supports     []  Mounts lateral trunk supports swing-away or removable for transfers   []  Anterior chest      strap, vest     []  decrease forward movement of shoulder  []  decrease forward movement of trunk  []  safety/stability  []  added abdominal support  []  trunk alignment  []  assistance with shoulder control   []  decrease shoulder elevation    []  Headrest      []  provide posterior head support  []  provide posterior neck support  []  provide lateral head support  []  provide anterior head support  []  support during tilt and recline  []  improve feeding     []  improve respiration  []  placement of switches  []  safety    []   accommodate ROM   []  accommodate tone  []  improve visual orientation   []  Headrest           []  fixed []  removable []  flip down      Mounting hardware   []  swing-away laterals/switches  []  mount headrest   []  mounts headrest flip down or  removable for transfers  []  mount headrest swing-away laterals   []  mount switches     []  Neck Support    []  decrease neck rotation  []  decrease forward neck flexion   Pelvic Positioner    [x]  std hip belt          []  padded hip belt  []  dual pull hip belt  []  four point hip belt  []  stabilize tone  [x]  decrease falling out of chair  []  prevent excessive extension  []  special pull angle to control      rotation  []  pad for protection over boney   prominence  []  promote comfort    []  Essential needs        bag/pouch   []  medicines []  special food rorthotics []  clothing changes  []  diapers  []  catheter/hygiene []  ostomy supplies   The above equipment has a life- long use expectancy.  Growth and changes in medical and/or functional conditions would be  the exceptions.   SUMMARY:  Pt came in today with family member but with cane only. Patient needed min A to ambulate and wheelchair was provided and patient was pushed to treatment room. 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 These conditions greatly limit her strength, endurance, joint mobility, balance, and overall ability to move safely within the home.  During evaluation, the patient completed the Timed Up and Go test in 39 seconds while using a rollator, which places her at a very high risk for  falls. She demonstrated poor foot clearance on the left foot during swing, excessive knee flexion in the left leg, and compensatory knee flexion in the right leg. These unsafe gait patterns significantly increase her fall risk and make continued use of the rollator unsafe and medically inappropriate. The rollator requires stable balance, adequate leg strength, and controlled gait, all of which she is unable to maintain.  The patient also presents with decreased range of motion and moderate weakness in both shoulders due to long standing osteoarthritis. These limitations prevent her from being able to safely and effectively propel any type of manual wheelchair. Her chronic shoulder pain, reduced endurance related to congestive heart failure and anemia, and joint degeneration in the spine and ankles make manual propulsion unsafe, exhausting, and not feasible for completing daily mobility in her home.  Because of her advanced age, impaired gait mechanics, shoulder weakness, decreased endurance, and multiple chronic conditions, a Group 2 power wheelchair is the least costly and most medically appropriate mobility device. This device will allow the patient to complete essential mobility related activities of daily living safely and independently without relying on unsafe walking patterns or upper body strength that she does not have. A Group 2 power wheelchair will greatly reduce her fall risk, prevent further functional decline, and provide consistent and safe mobility within her home environment.     OBJECTIVE IMPAIRMENTS Abnormal gait, decreased activity tolerance, decreased balance, decreased endurance, decreased mobility, difficulty walking, decreased ROM, decreased strength, hypomobility, increased edema, increased muscle spasms, impaired flexibility, impaired UE functional use, postural dysfunction, and pain.   ACTIVITY LIMITATIONS carrying, lifting, standing, squatting, stairs, bathing, reach over  head, and caring for others  PARTICIPATION LIMITATIONS: meal prep, cleaning, laundry, driving, shopping, community activity, and yard work  PERSONAL FACTORS Age and 3+ comorbidities: DM, CKD, CHF, OA are also affecting patient's functional outcome.   REHAB POTENTIAL: Good  CLINICAL DECISION MAKING: Stable/uncomplicated  EVALUATION COMPLEXITY: Moderate                                   GOALS: One time visit. No goals established.    PLAN: PT FREQUENCY: one time visit    Raj LOISE Blanch, PT 04/10/2024, 10:37 AM    I concur with the above findings and recommendations of the therapist:  Physician name printed:         Physician's signature:      Date:      "

## 2024-04-11 ENCOUNTER — Encounter (HOSPITAL_COMMUNITY)
Admission: RE | Admit: 2024-04-11 | Discharge: 2024-04-11 | Disposition: A | Discharge status: Home / Self Care | Source: Ambulatory Visit | Attending: Nephrology | Admitting: Nephrology

## 2024-04-11 VITALS — BP 102/56 | HR 71 | Temp 97.1°F | Resp 16

## 2024-04-11 DIAGNOSIS — D631 Anemia in chronic kidney disease: Secondary | ICD-10-CM

## 2024-04-11 DIAGNOSIS — N184 Chronic kidney disease, stage 4 (severe): Secondary | ICD-10-CM | POA: Diagnosis not present

## 2024-04-11 LAB — POCT HEMOGLOBIN-HEMACUE: Hemoglobin: 11.1 g/dL — ABNORMAL LOW (ref 12.0–15.0)

## 2024-04-11 MED ORDER — EPOETIN ALFA-EPBX 10000 UNIT/ML IJ SOLN
INTRAMUSCULAR | Status: AC
  Filled 2024-04-11: qty 1

## 2024-04-11 MED ORDER — EPOETIN ALFA-EPBX 10000 UNIT/ML IJ SOLN
10000.0000 [IU] | Freq: Once | INTRAMUSCULAR | Status: AC
  Administered 2024-04-11: 10000 [IU] via SUBCUTANEOUS

## 2024-04-19 ENCOUNTER — Other Ambulatory Visit: Payer: Self-pay | Admitting: *Deleted

## 2024-04-19 ENCOUNTER — Telehealth: Payer: Self-pay | Admitting: Family Medicine

## 2024-04-19 ENCOUNTER — Encounter: Payer: Self-pay | Admitting: Physical Therapy

## 2024-04-19 ENCOUNTER — Encounter (HOSPITAL_COMMUNITY): Payer: Self-pay | Admitting: Nephrology

## 2024-04-19 ENCOUNTER — Ambulatory Visit: Admitting: Physical Therapy

## 2024-04-19 DIAGNOSIS — R2689 Other abnormalities of gait and mobility: Secondary | ICD-10-CM

## 2024-04-19 DIAGNOSIS — M6281 Muscle weakness (generalized): Secondary | ICD-10-CM | POA: Diagnosis not present

## 2024-04-19 DIAGNOSIS — E1122 Type 2 diabetes mellitus with diabetic chronic kidney disease: Secondary | ICD-10-CM

## 2024-04-19 NOTE — Telephone Encounter (Signed)
?  Ok to placed podiatry referral?

## 2024-04-19 NOTE — Telephone Encounter (Signed)
 Ok to place referral

## 2024-04-19 NOTE — Therapy (Signed)
 " OUTPATIENT PHYSICAL THERAPY LOWER EXTREMITY EVALUATION   Patient Name: Mayukha Symmonds MRN: [id number] DOB:1945/12/16, 79 y.o., female Today's Date: 04/19/2024  END OF SESSION:  PT End of Session - 04/19/24 1331     Visit Number 1    Number of Visits 16    Date for Recertification  06/14/24    Authorization Type [employer] Medicare    PT Start Time 1105    PT Stop Time 1146    PT Time Calculation (min) 41 min    Equipment Utilized During Treatment Other (comment)    Activity Tolerance Patient tolerated treatment well    Behavior During Therapy WFL for tasks assessed/performed          Past Medical History:  Diagnosis Date   Chronic kidney disease    Diabetes mellitus without complication (HCC)    GERD (gastroesophageal reflux disease)    Glaucoma    Heart disease    Past Surgical History:  Procedure Laterality Date   ABDOMINAL HYSTERECTOMY     blood transfusion     BREAST SURGERY     Breast Reduction   CERVICAL FUSION     EYE SURGERY     KNEE CARTILAGE SURGERY     LUMBAR DISC SURGERY     REDUCTION MAMMAPLASTY     RETINOPATHY SURGERY     SHOULDER SURGERY Bilateral    Rotator Cuff   TRABECULECTOMY     TUBAL LIGATION     VITRECTOMY AND CATARACT     WRIST SURGERY     Carpal Tunnel   Patient Active Problem List   Diagnosis Date Noted   Debility 02/27/2024   Dyslipidemia due to type 2 diabetes mellitus (HCC) 01/13/2024   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  (HCC) 11/24/2023   Lumbar degenerative disc disease 06/02/2022   Chronic neck pain 07/31/2021   Osteoarthritis 01/29/2021   Vertigo 09/26/2018   Cluster headaches 09/26/2018   Dry mouth 09/26/2018   Type 2 diabetes mellitus with stage 4 chronic kidney disease (HCC) 03/30/2018   CKD (chronic kidney disease) stage 4, GFR 15-29 ml/min (HCC) 03/30/2018   Hypertension associated with diabetes (HCC) 03/30/2018   Glaucoma 03/30/2018   Chronic pain of both shoulders 03/30/2018    Allergic rhinitis 03/30/2018   Anemia 03/30/2018     PCP: Worth Kitty  REFERRING PROVIDER: Worth Kitty   REFERRING DIAG: Decreased strength, endurance, mobility   THERAPY DIAG:  Other abnormalities of gait and mobility  Muscle weakness (generalized)  Rationale for Evaluation and Treatment: Rehabilitation  ONSET DATE:    SUBJECTIVE:   SUBJECTIVE STATEMENT: Pt states decreased mobility. She is In process of trying to get  an electric wheelchair. She is Using RW at all times now. Was using Cane. She is unable to sustain standing/walking for any length of time, or do do things with her family, so is hopeful for the wheelchair.  She sates Decreased vision, peripheral vision, and poor depth perception.  She has Neuropathy in feet.   States Low back pain- previous back surgery. Did have recent Back MRI : 01/2023.  Lives with daughter and her family. Tries to help by doing dishes, loads and unloads dishwasher which does bother her back.  She has no stairs inside, or to enter home.  She has had a few falls, and states several close calls, 1-2x/wk, where she bumps into walls or her legs give out.   PERTINENT HISTORY: CKD, DM 2, glaucoma , decreased vision.  PAIN:  Are you having pain? Yes: NPRS scale: up to 10 /10 Pain location: back pain  Pain description: sore Aggravating factors: first thing in AM,  bending, stoop, twist.  Relieving factors: none stated   Are you having pain? Yes: NPRS scale: up to 5-10/10 Pain location: knees  Pain description: locks, weak, achey Aggravating factors: increased standing/walking.  Relieving factors: none stated     PRECAUTIONS: Fall  WEIGHT BEARING RESTRICTIONS: No  FALLS:  Has patient fallen in last 6 months?  Yes. Number of falls last year: 2-3, but has several near falls, and bumps in to wall (may happen 1-2 x/wk)    PLOF: Independent  PATIENT GOALS:  Improve strength, mobility, balance   NEXT MD VISIT:   OBJECTIVE:    DIAGNOSTIC FINDINGS:   PATIENT SURVEYS:    COGNITION: Overall cognitive status: Within functional limits for tasks assessed     SENSATION: WFL  EDEMA: mild in lower legs today , bilateral   POSTURE:      PALPATION:   LOWER EXTREMITY ROM: Knees: WFL Ankles: WFL Hips: WFL   LOWER EXTREMITY MMT:  MMT Left eval Right  eval  Hip flexion 4- 4-  Hip extension    Hip abduction    Hip adduction    Hip internal rotation    Hip external rotation    Knee flexion 4 4  Knee extension 4 4  Ankle dorsiflexion 4- 4-  Ankle plantarflexion    Ankle inversion    Ankle eversion     (Blank rows = not tested)  LOWER EXTREMITY SPECIAL TESTS:     FUNCTIONAL TESTS:  Eval:  TUG:   27.68  using 4 WW and hands to rise from chair.  BERG  29    [facility] PT Assessment - 04/19/24 0001       Standardized Balance Assessment   Standardized Balance Assessment Berg Balance Test;Timed Up and Go Test      Berg Balance Test   Sit to Stand Able to stand  independently using hands    Standing Unsupported Able to stand 2 minutes with supervision    Sitting with Back Unsupported but Feet Supported on Floor or Stool Able to sit safely and securely 2 minutes    Stand to Sit Controls descent by using hands    Transfers Able to transfer safely, definite need of hands    Standing Unsupported with Eyes Closed Able to stand 10 seconds with supervision    Standing Unsupported with Feet Together Needs help to attain position and unable to hold for 15 seconds    From Standing, Reach Forward with Outstretched Arm Can reach forward >5 cm safely (2)    From Standing Position, Pick up Object from Floor Unable to pick up shoe, but reaches 2-5 cm (1-2) from shoe and balances independently    From Standing Position, Turn to Look Behind Over each Shoulder Looks behind one side only/other side shows less weight shift    Turn 360 Degrees Able to turn 360 degrees safely but slowly    Standing Unsupported,  Alternately Place Feet on Step/Stool Able to complete >2 steps/needs minimal assist    Standing Unsupported, One Foot in [employer] balance while stepping or standing    Standing on One Leg Unable to try or needs assist to prevent fall    Total Score 29      Timed Up and Go Test   Normal TUG (seconds) 27.68    TUG Comments with use  of 4WW and hands to rise from chair            GAIT: Distance walked: 1105ft Assistive device utilized: [employer]ant - 4 wheeled Level of assistance: Modified independence Comments: low step height ,    TODAY'S TREATMENT:                                                                                                                              DATE:  Eval:  reviewed LAQ and ankle pumps for simple chair LE mobility.    PATIENT EDUCATION:  Education details: PT POC, Exam findings, HEP Person educated: Patient Education method: Explanation, Demonstration, Tactile cues, Verbal cues, and Handouts Education comprehension: verbalized understanding, returned demonstration, verbal cues required, tactile cues required, and needs further education   HOME EXERCISE PROGRAM: Reviewed LAQ and ankle pumps daily.   ASSESSMENT:  CLINICAL IMPRESSION: Patient presents with primary complaint of deconditioning and decreased mobility. She has much weakness in LEs that limits tolerance for standing and walking activity. She currently started using 4 WW, and we discussed continued use at all times for safety. She states leg giving out fairly often, so having the seat will be helpful for her. She has low scores on balance test today. She has low step height with gait, and states inability for stairs. She has bothersome back pain and has no HEP for this or for LE strength at this time.  Plan to educate on initial HEP next visit for back pain and LE strength. Pt with decreased ability for full functional activities. Pt will  benefit from skilled PT to improve deficits and pain and  to return to PLOF. We discussed transfer to neuro PT clinic at different location due to likely need for more support/ parallel bars for safety with balance training.   OBJECTIVE IMPAIRMENTS: Abnormal gait, decreased activity tolerance, decreased balance, decreased knowledge of use of DME, decreased mobility, difficulty walking, decreased strength, decreased safety awareness, improper body mechanics, and pain.   ACTIVITY LIMITATIONS: lifting, bending, standing, squatting, stairs, transfers, hygiene/grooming, and locomotion level  PARTICIPATION LIMITATIONS: meal prep, cleaning, laundry, shopping, and community activity  PERSONAL FACTORS: Time since onset of injury/illness/exacerbation and 1-2 comorbidities: CKD, DM, vision, neuropathy.  are also affecting patient's functional outcome.   REHAB POTENTIAL: Good  CLINICAL DECISION MAKING: Evolving/moderate complexity  EVALUATION COMPLEXITY: Moderate   GOALS: Goals reviewed with patient? Yes  SHORT TERM GOALS: Target date: 05/10/2024  Pt to be independent with initial HEP   Goal status: INITIAL  2.  Pt to demo ability to consistently rise from chair on 1st attempt.   Goal status: INITIAL  3.  Pt to report use of RW at all times for mobility.   Goal status: INITIAL    LONG TERM GOALS: Target date: 06/14/2024   Pt to be independent with final HEP  Goal status: INITIAL  2.  Pt to demo improved LE strength, to be at max ability , to  improve stability and gait.   Goal status: INITIAL  3.  Pt to demo improved score on BERG by at least 5 points.   Goal status: INITIAL  4.  Pt to demo improved score on TUG by at least 5 sec.  Goal status: INITIAL     PLAN:  PT FREQUENCY: 1-2x/week  PT DURATION: 8 weeks  PLANNED INTERVENTIONS: Therapeutic exercises, Therapeutic activity, Neuromuscular re-education, Patient/Family education, Self Care, Joint mobilization, Joint manipulation, Stair training, Orthotic/Fit training, DME  instructions, Aquatic Therapy, Dry Needling, Electrical stimulation, Cryotherapy, Moist heat, Taping, Ultrasound, Ionotophoresis 4mg /ml Dexamethasone, Manual therapy,  Vasopneumatic device, Traction, Spinal manipulation, Spinal mobilization,Balance training, Gait training,   PLAN FOR NEXT SESSION:  initial HEP for : LE strength, and lumbar mobility to perform in AM when back is painful.   LE strength,  Balance, weight shifts, step ups,    Tinnie Don, PT, DPT 1:32 PM  04/19/24   "

## 2024-04-19 NOTE — Telephone Encounter (Signed)
 Patient arrived for PT appt and requested an update on podiatry referral that was discussed by her and PCP during her OV in November. I do not see a referral for podiatry. Can this be placed? Please advise.

## 2024-04-19 NOTE — Telephone Encounter (Signed)
 podiatry Referral placed

## 2024-04-24 ENCOUNTER — Ambulatory Visit

## 2024-04-25 ENCOUNTER — Encounter (HOSPITAL_COMMUNITY)

## 2024-05-09 ENCOUNTER — Encounter (HOSPITAL_COMMUNITY)

## 2024-07-26 ENCOUNTER — Ambulatory Visit: Admitting: Family Medicine
